# Patient Record
Sex: Male | Born: 1940
Health system: Southern US, Community
[De-identification: ages and names within clinical notes are randomized; demographics above are authoritative.]

## PROBLEM LIST (undated history)

## (undated) DIAGNOSIS — M1990 Unspecified osteoarthritis, unspecified site: Secondary | ICD-10-CM

## (undated) DIAGNOSIS — I712 Thoracic aortic aneurysm, without rupture: Secondary | ICD-10-CM

## (undated) DIAGNOSIS — D75829 Heparin-induced thrombocytopenia, unspecified: Secondary | ICD-10-CM

## (undated) DIAGNOSIS — Z5189 Encounter for other specified aftercare: Secondary | ICD-10-CM

## (undated) DIAGNOSIS — Z941 Heart transplant status: Secondary | ICD-10-CM

## (undated) DIAGNOSIS — D7582 Heparin induced thrombocytopenia (HIT): Secondary | ICD-10-CM

## (undated) DIAGNOSIS — J449 Chronic obstructive pulmonary disease, unspecified: Secondary | ICD-10-CM

## (undated) DIAGNOSIS — I714 Abdominal aortic aneurysm, without rupture, unspecified: Secondary | ICD-10-CM

## (undated) DIAGNOSIS — J90 Pleural effusion, not elsewhere classified: Secondary | ICD-10-CM

## (undated) DIAGNOSIS — I517 Cardiomegaly: Secondary | ICD-10-CM

## (undated) DIAGNOSIS — F32A Depression, unspecified: Secondary | ICD-10-CM

## (undated) DIAGNOSIS — E7800 Pure hypercholesterolemia, unspecified: Secondary | ICD-10-CM

## (undated) DIAGNOSIS — F329 Major depressive disorder, single episode, unspecified: Secondary | ICD-10-CM

## (undated) DIAGNOSIS — R0602 Shortness of breath: Secondary | ICD-10-CM

## (undated) DIAGNOSIS — I499 Cardiac arrhythmia, unspecified: Secondary | ICD-10-CM

## (undated) DIAGNOSIS — J45909 Unspecified asthma, uncomplicated: Secondary | ICD-10-CM

## (undated) DIAGNOSIS — I10 Essential (primary) hypertension: Secondary | ICD-10-CM

## (undated) DIAGNOSIS — I7121 Aneurysm of the ascending aorta, without rupture: Secondary | ICD-10-CM

## (undated) DIAGNOSIS — I2510 Atherosclerotic heart disease of native coronary artery without angina pectoris: Secondary | ICD-10-CM

## (undated) HISTORY — DX: Unspecified osteoarthritis, unspecified site: M19.90

## (undated) HISTORY — DX: Heart transplant status: Z94.1

## (undated) HISTORY — DX: Thoracic aortic aneurysm, without rupture: I71.2

## (undated) HISTORY — DX: Cardiomegaly: I51.7

## (undated) HISTORY — DX: Atherosclerotic heart disease of native coronary artery without angina pectoris: I25.10

## (undated) HISTORY — DX: Unspecified asthma, uncomplicated: J45.909

## (undated) HISTORY — DX: Major depressive disorder, single episode, unspecified: F32.9

## (undated) HISTORY — DX: Depression, unspecified: F32.A

## (undated) HISTORY — DX: Encounter for other specified aftercare: Z51.89

## (undated) HISTORY — DX: Aneurysm of the ascending aorta, without rupture: I71.21

## (undated) HISTORY — DX: Pleural effusion, not elsewhere classified: J90

---

## 1993-07-13 HISTORY — PX: HEART TRANSPLANT: SHX268

## 1999-09-23 ENCOUNTER — Encounter: Payer: Self-pay | Admitting: *Deleted

## 1999-09-23 ENCOUNTER — Ambulatory Visit (HOSPITAL_COMMUNITY): Admission: RE | Admit: 1999-09-23 | Discharge: 1999-09-23 | Payer: Self-pay | Admitting: *Deleted

## 1999-10-01 ENCOUNTER — Encounter: Payer: Self-pay | Admitting: *Deleted

## 1999-10-01 ENCOUNTER — Ambulatory Visit (HOSPITAL_COMMUNITY): Admission: RE | Admit: 1999-10-01 | Discharge: 1999-10-01 | Payer: Self-pay | Admitting: *Deleted

## 2001-05-05 ENCOUNTER — Ambulatory Visit (HOSPITAL_COMMUNITY): Admission: RE | Admit: 2001-05-05 | Discharge: 2001-05-05 | Payer: Self-pay | Admitting: Family Medicine

## 2001-05-05 ENCOUNTER — Encounter: Payer: Self-pay | Admitting: Family Medicine

## 2003-07-09 ENCOUNTER — Ambulatory Visit (HOSPITAL_COMMUNITY): Admission: RE | Admit: 2003-07-09 | Discharge: 2003-07-09 | Payer: Self-pay | Admitting: Family Medicine

## 2005-05-25 ENCOUNTER — Ambulatory Visit (HOSPITAL_COMMUNITY): Admission: RE | Admit: 2005-05-25 | Discharge: 2005-05-25 | Payer: Self-pay | Admitting: Gastroenterology

## 2006-04-30 ENCOUNTER — Ambulatory Visit: Payer: Self-pay

## 2006-04-30 ENCOUNTER — Encounter: Payer: Self-pay | Admitting: Internal Medicine

## 2009-03-04 ENCOUNTER — Encounter: Admission: RE | Admit: 2009-03-04 | Discharge: 2009-03-04 | Payer: Self-pay | Admitting: Family Medicine

## 2009-06-18 ENCOUNTER — Encounter: Admission: RE | Admit: 2009-06-18 | Discharge: 2009-06-18 | Payer: Self-pay | Admitting: Family Medicine

## 2010-11-28 NOTE — Op Note (Signed)
NAME:  Luke Jacobson, PEREGOY NO.:  [id number]   MEDICAL RECORD NO.:  [id number]          PATIENT TYPE:  AMB   LOCATION:  ENDO                         FACILITY:  [facility]   PHYSICIAN:  Tip L. Rolla Flatten., M.D.DATE OF BIRTH:  [birth year]   DATE OF PROCEDURE:  05/25/2005  DATE OF DISCHARGE:                                 OPERATIVE REPORT   PROCEDURE:  Colonoscopy.   MEDICATIONS GIVEN:  Tobramycin 80 mg and ampicillin 3 grams IV due to  previous heart transplant, Fentanyl 75 mcg, Versed 6 mg IV.   INSTRUMENT USED:  Pediatric adjustable colonoscope.   INDICATIONS FOR PROCEDURE:  Colon cancer screening per Dr. Stann Mainland at [facility] in  a gentleman who is ten years status post heart transplant doing well.   DESCRIPTION OF PROCEDURE:  The procedure was explained to the patient and  consent obtained.  After the patient received his preoperative antibiotics,  he was sedated and the pediatric scope was inserted and advanced.  We were  able to easily reach the cecum.  The ileocecal valve and appendiceal orifice  were seen.  The scope was withdrawn and the mucosa carefully examined.  No  polyps seen throughout the entire colon.  Moderate diverticulosis in the  sigmoid colon.  The rectum was free of polyps.  The scope was withdrawn.  The patient tolerated the procedure well.   ASSESSMENT:  1.  Normal screening colonoscopy, V76.51.  2.  Moderate diverticulosis.   PLAN:  Will recommend yearly hemoccults and possibly repeat the procedure in  ten years.  We will give the patient ampicillin 1 gram orally q.8h. for one  day following the procedure.           ______________________________  Joyice Faster. Rolla Flatten., M.D.     Jaynie Bream  D:  05/25/2005  T:  05/25/2005  Job:  [id number]   cc:   Marijo File, M.D.   Charlynn Grimes, M.D.

## 2011-09-08 DIAGNOSIS — T86290 Cardiac allograft vasculopathy: Secondary | ICD-10-CM | POA: Insufficient documentation

## 2011-09-08 DIAGNOSIS — D75829 Heparin-induced thrombocytopenia, unspecified: Secondary | ICD-10-CM | POA: Insufficient documentation

## 2011-09-08 DIAGNOSIS — Z87891 Personal history of nicotine dependence: Secondary | ICD-10-CM | POA: Insufficient documentation

## 2011-09-08 DIAGNOSIS — D7582 Heparin induced thrombocytopenia (HIT): Secondary | ICD-10-CM | POA: Insufficient documentation

## 2011-09-08 DIAGNOSIS — N200 Calculus of kidney: Secondary | ICD-10-CM | POA: Insufficient documentation

## 2012-03-08 ENCOUNTER — Emergency Department (HOSPITAL_COMMUNITY): Payer: Medicare Other

## 2012-03-08 ENCOUNTER — Emergency Department (HOSPITAL_COMMUNITY)
Admission: EM | Admit: 2012-03-08 | Discharge: 2012-03-09 | Disposition: A | Discharge status: Home / Self Care | Payer: Medicare Other | Source: Home / Self Care | Attending: Emergency Medicine | Admitting: Emergency Medicine

## 2012-03-08 ENCOUNTER — Encounter (HOSPITAL_COMMUNITY): Payer: Self-pay | Admitting: *Deleted

## 2012-03-08 DIAGNOSIS — R05 Cough: Secondary | ICD-10-CM | POA: Insufficient documentation

## 2012-03-08 DIAGNOSIS — R059 Cough, unspecified: Secondary | ICD-10-CM | POA: Insufficient documentation

## 2012-03-08 HISTORY — DX: Essential (primary) hypertension: I10

## 2012-03-08 NOTE — ED Notes (Signed)
The pt has had a cough since  Friday night.  He was seen at [facility] and sent here for a chest cold and bronchitis.  The doctor wants him to be seen to get a chest xray

## 2012-03-08 NOTE — ED Notes (Signed)
The pt refuses anan ekg,  He says he is here only for a chest xray

## 2012-03-08 NOTE — ED Notes (Signed)
Heart transplant 18 years ago

## 2012-03-10 ENCOUNTER — Inpatient Hospital Stay (HOSPITAL_COMMUNITY)
Admission: EM | Admit: 2012-03-10 | Discharge: 2012-03-13 | DRG: 193 | Disposition: A | Discharge status: Home / Self Care | Payer: Medicare Other | Attending: Internal Medicine | Admitting: Internal Medicine

## 2012-03-10 ENCOUNTER — Emergency Department (HOSPITAL_COMMUNITY): Payer: Medicare Other

## 2012-03-10 ENCOUNTER — Encounter (HOSPITAL_COMMUNITY): Payer: Self-pay

## 2012-03-10 DIAGNOSIS — R0602 Shortness of breath: Secondary | ICD-10-CM | POA: Insufficient documentation

## 2012-03-10 DIAGNOSIS — J189 Pneumonia, unspecified organism: Secondary | ICD-10-CM

## 2012-03-10 DIAGNOSIS — J9610 Chronic respiratory failure, unspecified whether with hypoxia or hypercapnia: Secondary | ICD-10-CM | POA: Insufficient documentation

## 2012-03-10 DIAGNOSIS — I509 Heart failure, unspecified: Secondary | ICD-10-CM

## 2012-03-10 DIAGNOSIS — I5033 Acute on chronic diastolic (congestive) heart failure: Secondary | ICD-10-CM

## 2012-03-10 DIAGNOSIS — J449 Chronic obstructive pulmonary disease, unspecified: Secondary | ICD-10-CM

## 2012-03-10 DIAGNOSIS — R0902 Hypoxemia: Secondary | ICD-10-CM

## 2012-03-10 DIAGNOSIS — J441 Chronic obstructive pulmonary disease with (acute) exacerbation: Secondary | ICD-10-CM | POA: Diagnosis present

## 2012-03-10 DIAGNOSIS — I5032 Chronic diastolic (congestive) heart failure: Secondary | ICD-10-CM | POA: Insufficient documentation

## 2012-03-10 DIAGNOSIS — I10 Essential (primary) hypertension: Secondary | ICD-10-CM | POA: Diagnosis present

## 2012-03-10 DIAGNOSIS — J159 Unspecified bacterial pneumonia: Secondary | ICD-10-CM

## 2012-03-10 DIAGNOSIS — N179 Acute kidney failure, unspecified: Secondary | ICD-10-CM

## 2012-03-10 DIAGNOSIS — Z941 Heart transplant status: Secondary | ICD-10-CM

## 2012-03-10 DIAGNOSIS — J9600 Acute respiratory failure, unspecified whether with hypoxia or hypercapnia: Secondary | ICD-10-CM

## 2012-03-10 DIAGNOSIS — I2510 Atherosclerotic heart disease of native coronary artery without angina pectoris: Secondary | ICD-10-CM

## 2012-03-10 HISTORY — DX: Chronic obstructive pulmonary disease, unspecified: J44.9

## 2012-03-10 HISTORY — DX: Shortness of breath: R06.02

## 2012-03-10 HISTORY — DX: Pure hypercholesterolemia, unspecified: E78.00

## 2012-03-10 HISTORY — DX: Heparin-induced thrombocytopenia, unspecified: D75.829

## 2012-03-10 HISTORY — DX: Heparin induced thrombocytopenia (HIT): D75.82

## 2012-03-10 LAB — BASIC METABOLIC PANEL
Chloride: 100 mEq/L (ref 96–112)
GFR calc Af Amer: 79 mL/min — ABNORMAL LOW (ref >90)
GFR calc non Af Amer: 68 mL/min — ABNORMAL LOW (ref >90)
Potassium: 4.4 mEq/L (ref 3.5–5.1)
Sodium: 135 mEq/L (ref 135–145)

## 2012-03-10 LAB — URINALYSIS, ROUTINE W REFLEX MICROSCOPIC
Leukocytes, UA: NEGATIVE
Nitrite: NEGATIVE
Specific Gravity, Urine: 1.029 (ref 1.005–1.030)
Urobilinogen, UA: 1 mg/dL (ref 0.0–1.0)
pH: 6 (ref 5.0–8.0)

## 2012-03-10 LAB — CBC WITH DIFFERENTIAL/PLATELET
Basophils Absolute: 0 10*3/uL (ref 0.0–0.1)
Basophils Relative: 0 % (ref 0–1)
Eosinophils Absolute: 0.1 10*3/uL (ref 0.0–0.7)
Hemoglobin: 14.9 g/dL (ref 13.0–17.0)
MCH: 31.1 pg (ref 26.0–34.0)
MCHC: 34 g/dL (ref 30.0–36.0)
Neutro Abs: 3.6 10*3/uL (ref 1.7–7.7)
Neutrophils Relative %: 60 % (ref 43–77)
Platelets: 95 10*3/uL — ABNORMAL LOW (ref 150–400)
RDW: 13.5 % (ref 11.5–15.5)

## 2012-03-10 LAB — URINE MICROSCOPIC-ADD ON

## 2012-03-10 LAB — PRO B NATRIURETIC PEPTIDE: Pro B Natriuretic peptide (BNP): 492.2 pg/mL — ABNORMAL HIGH (ref 0–125)

## 2012-03-10 LAB — MRSA PCR SCREENING: MRSA by PCR: NEGATIVE

## 2012-03-10 MED ORDER — ACETAMINOPHEN 650 MG RE SUPP: 650.0000 mg | Freq: Four times a day (QID) | RECTAL | Status: DC | PRN

## 2012-03-10 MED ORDER — IPRATROPIUM BROMIDE 0.02 % IN SOLN
0.5000 mg | Freq: Four times a day (QID) | RESPIRATORY_TRACT | Status: DC
  Administered 2012-03-10 – 2012-03-11 (×5): 0.5 mg via RESPIRATORY_TRACT
  Filled 2012-03-10 (×5): qty 2.5

## 2012-03-10 MED ORDER — ONDANSETRON HCL 4 MG PO TABS: 4.0000 mg | ORAL_TABLET | Freq: Four times a day (QID) | ORAL | Status: DC | PRN

## 2012-03-10 MED ORDER — LORATADINE 10 MG PO TABS
10.0000 mg | ORAL_TABLET | Freq: Every day | ORAL | Status: DC
  Administered 2012-03-11 – 2012-03-13 (×3): 10 mg via ORAL
  Filled 2012-03-10 (×3): qty 1

## 2012-03-10 MED ORDER — ONDANSETRON HCL 4 MG/2ML IJ SOLN
4.0000 mg | Freq: Four times a day (QID) | INTRAMUSCULAR | Status: DC | PRN
  Administered 2012-03-12: 4 mg via INTRAVENOUS
  Filled 2012-03-10: qty 2

## 2012-03-10 MED ORDER — FAMOTIDINE 20 MG PO TABS
20.0000 mg | ORAL_TABLET | Freq: Every day | ORAL | Status: DC
  Administered 2012-03-10 – 2012-03-13 (×4): 20 mg via ORAL
  Filled 2012-03-10 (×4): qty 1

## 2012-03-10 MED ORDER — MAGNESIUM OXIDE 400 MG PO TABS
800.0000 mg | ORAL_TABLET | Freq: Two times a day (BID) | ORAL | Status: DC
  Administered 2012-03-10 – 2012-03-13 (×6): 800 mg via ORAL
  Filled 2012-03-10 (×7): qty 2

## 2012-03-10 MED ORDER — MYCOPHENOLATE MOFETIL 250 MG PO CAPS
1500.0000 mg | ORAL_CAPSULE | Freq: Once | ORAL | Status: AC
  Administered 2012-03-10: 1500 mg via ORAL
  Filled 2012-03-10: qty 6

## 2012-03-10 MED ORDER — ISOSORBIDE MONONITRATE ER 30 MG PO TB24
45.0000 mg | ORAL_TABLET | Freq: Every evening | ORAL | Status: DC
  Administered 2012-03-10 – 2012-03-12 (×3): 45 mg via ORAL
  Filled 2012-03-10 (×4): qty 1

## 2012-03-10 MED ORDER — DEXTROSE 5 % IV SOLN
2.0000 g | Freq: Two times a day (BID) | INTRAVENOUS | Status: DC
  Administered 2012-03-10 – 2012-03-12 (×5): 2 g via INTRAVENOUS
  Filled 2012-03-10 (×7): qty 2

## 2012-03-10 MED ORDER — OMEGA-3-ACID ETHYL ESTERS 1 G PO CAPS
1.0000 g | ORAL_CAPSULE | Freq: Every evening | ORAL | Status: DC
  Administered 2012-03-10 – 2012-03-12 (×3): 1 g via ORAL
  Filled 2012-03-10 (×4): qty 1

## 2012-03-10 MED ORDER — ALBUTEROL SULFATE (5 MG/ML) 0.5% IN NEBU
2.5000 mg | INHALATION_SOLUTION | Freq: Four times a day (QID) | RESPIRATORY_TRACT | Status: DC
  Administered 2012-03-10 – 2012-03-11 (×4): 2.5 mg via RESPIRATORY_TRACT
  Filled 2012-03-10 (×4): qty 0.5

## 2012-03-10 MED ORDER — ALBUTEROL SULFATE (5 MG/ML) 0.5% IN NEBU: 2.5000 mg | INHALATION_SOLUTION | RESPIRATORY_TRACT | Status: DC | PRN

## 2012-03-10 MED ORDER — VANCOMYCIN HCL 500 MG IV SOLR
500.0000 mg | Freq: Two times a day (BID) | INTRAVENOUS | Status: DC
  Administered 2012-03-10 – 2012-03-12 (×4): 500 mg via INTRAVENOUS
  Filled 2012-03-10 (×4): qty 500

## 2012-03-10 MED ORDER — SODIUM CHLORIDE 0.9 % IV SOLN
INTRAVENOUS | Status: DC
  Administered 2012-03-10: 50 mL via INTRAVENOUS

## 2012-03-10 MED ORDER — ASPIRIN EC 81 MG PO TBEC
81.0000 mg | DELAYED_RELEASE_TABLET | Freq: Every day | ORAL | Status: DC
  Administered 2012-03-10 – 2012-03-13 (×4): 81 mg via ORAL
  Filled 2012-03-10 (×4): qty 1

## 2012-03-10 MED ORDER — TEMAZEPAM 15 MG PO CAPS
30.0000 mg | ORAL_CAPSULE | Freq: Every evening | ORAL | Status: DC | PRN
  Administered 2012-03-10 – 2012-03-12 (×3): 30 mg via ORAL
  Filled 2012-03-10 (×3): qty 2

## 2012-03-10 MED ORDER — FUROSEMIDE 10 MG/ML IJ SOLN
40.0000 mg | Freq: Two times a day (BID) | INTRAMUSCULAR | Status: DC
  Administered 2012-03-10 – 2012-03-12 (×5): 40 mg via INTRAVENOUS
  Filled 2012-03-10 (×9): qty 4

## 2012-03-10 MED ORDER — FISH OIL 1000 MG PO CAPS
1.0000 | ORAL_CAPSULE | Freq: Every evening | ORAL | Status: DC
  Filled 2012-03-10: qty 1

## 2012-03-10 MED ORDER — LEVOFLOXACIN IN D5W 750 MG/150ML IV SOLN
750.0000 mg | Freq: Once | INTRAVENOUS | Status: AC
  Administered 2012-03-10: 750 mg via INTRAVENOUS
  Filled 2012-03-10: qty 150

## 2012-03-10 MED ORDER — SODIUM CHLORIDE 0.9 % IJ SOLN
3.0000 mL | Freq: Two times a day (BID) | INTRAMUSCULAR | Status: DC
  Administered 2012-03-10 – 2012-03-12 (×5): 3 mL via INTRAVENOUS

## 2012-03-10 MED ORDER — ACETAMINOPHEN 325 MG PO TABS: 650.0000 mg | ORAL_TABLET | Freq: Four times a day (QID) | ORAL | Status: DC | PRN

## 2012-03-10 MED ORDER — MYCOPHENOLATE MOFETIL 500 MG PO TABS
1500.0000 mg | ORAL_TABLET | Freq: Two times a day (BID) | ORAL | Status: DC
  Filled 2012-03-10: qty 3

## 2012-03-10 MED ORDER — DILTIAZEM HCL ER BEADS 240 MG PO CP24
240.0000 mg | ORAL_CAPSULE | Freq: Every day | ORAL | Status: DC
  Administered 2012-03-10 – 2012-03-13 (×4): 240 mg via ORAL
  Filled 2012-03-10 (×4): qty 1

## 2012-03-10 MED ORDER — LEVOFLOXACIN IN D5W 750 MG/150ML IV SOLN
750.0000 mg | INTRAVENOUS | Status: DC
  Filled 2012-03-10: qty 150

## 2012-03-10 MED ORDER — MYCOPHENOLATE MOFETIL 500 MG PO TABS: 1500.0000 mg | ORAL_TABLET | Freq: Two times a day (BID) | ORAL | Status: DC

## 2012-03-10 MED ORDER — CYCLOSPORINE MODIFIED (NEORAL) 25 MG PO CAPS
75.0000 mg | ORAL_CAPSULE | Freq: Two times a day (BID) | ORAL | Status: DC
  Administered 2012-03-10 – 2012-03-13 (×6): 75 mg via ORAL
  Filled 2012-03-10 (×7): qty 3

## 2012-03-10 NOTE — ED Notes (Addendum)
Pt c/o SOB x 6 days. Sat 92% on Room air. Pt LWBS 2 days at [facility] but was able to chest xray and got dx with pneumonia by PCP, started on Levofloxacin (currently still taking it). Breath sound clear. No Pain, productive cough present at times.

## 2012-03-10 NOTE — H&P (Addendum)
[facility] History and Physical  Luke Jacobson WEX:[id number] DOB: 17-Jun-1941 DOA: 03/10/2012  Referring physician: Daleen Bo, MD PCP: Gara Kroner, MD   Chief Complaint: Worsening dyspnea.  HPI: Luke Jacobson is a 71 y.o. male history of ischemic cardiomyopathy, status post heart transplant in 1996, on immunosuppressants, followed at [facility], hypertension, hyperlipidemia, heparin-induced thrombocytopenia, possible COPD presents to the emergency department secondary to worsening dyspnea. He has not been feeling well over the last 6 months with worsening dyspnea, easy tiring, leg swelling and orthopnea. He was extensively evaluated at [facility] approximately a week ago. He and his wife indicate that he underwent a battery of tests including cardiac cath, nuclear stress test, echo and blood test. They were told that he had blockage in his transplanted heart which could not be stented and he was not a candidate for surgery. He was asked to continue his medications including Lasix. Approximately 5 days ago patient started having cough with white sputum, worsening dyspnea and feeling washed out. He denies fever or chills. No chest pain or palpitations or wheezing or chest tightness. 2 days later he went to see his PCP who referred him to the emergency department. He had chest x-ray but left AMA. PCP reviewed his x-ray yesterday and prescribed Levaquin for right lower lobe and lingular space airspace disease concerning for pneumonia. His pulse oximetry was 89% 2 days ago. We did not feel any better and went to see his PCP who referred him to the ED for admission. His O2 sats in the PCPs office was 90%. Evaluation in ED revealed platelet count 95, UA negative, chest x-ray showing improving pneumonia and right lower lobe and lingula since exam 2 days ago. Patient has received a dose of IV Levaquin in the hospitalist service is requested to admit for further management.   Review of Systems: The patient  denies anorexia, fever, weight loss,, vision loss, decreased hearing, hoarseness, chest pain, syncope, balance deficits, hemoptysis, abdominal pain, melena, hematochezia, severe indigestion/heartburn, hematuria, incontinence, genital sores, muscle weakness, suspicious skin lesions, transient blindness, difficulty walking, depression, unusual weight change, abnormal bleeding, enlarged lymph nodes, angioedema, and breast masses.   All systems reviewed and apart from history of presenting illness is pertinent for orthopnea and lower extremity edema. Otherwise review of systems negative.  Past Medical History  Diagnosis Date  . Hypertension   . Shortness of breath   . Hypercholesteremia   . 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 COPD (chronic obstructive pulmonary disease)    Past Surgical History  Procedure Date  . Heart transplant    Social History:  reports that he quit smoking about 18 years ago. He has never used smokeless tobacco. He reports that he drinks alcohol. He reports that he does not use illicit drugs. Married. Independent of activities of daily living. Retired  Allergies  Allergen Reactions  . Heparin     Made white blood count go down. HIT    Family History  Problem Relation Age of Onset  . Heart attack Father     Prior to Admission medications   Medication Sig Start Date End Date Taking? Authorizing Provider  acetaminophen (TYLENOL) 500 MG tablet Take 500 mg by mouth daily.   Yes Historical Provider, MD  aspirin EC 81 MG tablet Take 81 mg by mouth daily.   Yes Historical Provider, MD  cycloSPORINE modified (NEORAL) 25 MG capsule Take 75 mg by mouth 2 (two) times daily.   Yes Historical Provider, MD  diltiazem (TIAZAC) 240 MG  24 hr capsule Take 240 mg by mouth daily.   Yes Historical Provider, MD  fexofenadine (ALLEGRA) 180 MG tablet Take 180 mg by mouth daily.   Yes Historical Provider, MD  GARLIC PO Take 1 tablet by mouth daily.   Yes Historical Provider, MD    isosorbide mononitrate (IMDUR) 30 MG 24 hr tablet Take 45 mg by mouth every evening.   Yes Historical Provider, MD  levofloxacin (LEVAQUIN) 500 MG tablet Take 500 mg by mouth daily. PT TO TAKE FOR 10 DAYS ON DAY 1 OF THERAPY   Yes Historical Provider, MD  magnesium oxide (MAG-OX) 400 MG tablet Take 800 mg by mouth 2 (two) times daily.   Yes Historical Provider, MD  mycophenolate (CELLCEPT) 500 MG tablet Take 1,500 mg by mouth 2 (two) times daily.   Yes Historical Provider, MD  Omega-3 Fatty Acids (FISH OIL) 1000 MG CAPS Take 1 capsule by mouth every evening.   Yes Historical Provider, MD  ranitidine (ZANTAC) 75 MG tablet Take 150 mg by mouth daily.   Yes Historical Provider, MD  temazepam (RESTORIL) 15 MG capsule Take 30 mg by mouth at bedtime as needed. For sleep   Yes Historical Provider, MD   Physical Exam: Filed Vitals:   03/10/12 1244 03/10/12 1245 03/10/12 1647 03/10/12 1840  BP: 136/83 136/83 135/85   Pulse: 83 82 79   Temp: 98.5 F (36.9 C) 98.5 F (36.9 C) 97.6 F (36.4 C)   TempSrc: Oral Oral    Resp: _0 Height:    _1  (1.702 m)  SpO2: 95% 93% 95%    General exam: Moderately built and nourished male patient lying propped up on the gurney with mild respiratory distress. Able to speak in long sentences without difficulty however. Head, eyes and ENT: Nontraumatic and normocephalic. Pupils equally reacting to light and accommodation. Neck: Supple. No JVD or carotid bruit. No thyromegaly. Lymphatics: No lymphadenopathy. Respiratory system: Reduced breath sounds bibasally with bibasal crackles. Rest of the lung fields are clear. Mild increased work of breathing. Cardiovascular system: S1 and S2 heard, regular rate and rhythm. No JVD, murmurs. 2+ bilateral leg edema up to mid shin. Gastrointestinal system: Abdomen is nondistended, soft and nontender. Normal bowel sounds heard. No organomegaly or masses appreciated. Central nervous system: Alert and oriented x3. No focal  neurological deficits. Extremities: Symmetric 5 x 5 power. Peripheral pulses symmetrically felt. Skin: No skin tears or rashes. Musculoskeletal system: Negative. Psychiatry: Pleasant affect.   Labs on Admission:  Basic Metabolic Panel:  Lab 00/92/33 1355  NA 135  K 4.4  CL 100  CO2 26  GLUCOSE 75  BUN 20  CREATININE 1.07  CALCIUM 9.2  MG --  PHOS --   Liver Function Tests: No results found for this basename: AST:5,ALT:5,ALKPHOS:5,BILITOT:5,PROT:5,ALBUMIN:5 in the last 168 hours No results found for this basename: LIPASE:5,AMYLASE:5 in the last 168 hours No results found for this basename: AMMONIA:5 in the last 168 hours CBC:  Lab 03/10/12 1355  WBC 6.0  NEUTROABS 3.6  HGB 14.9  HCT 43.8  MCV 91.4  PLT 95*   Cardiac Enzymes: No results found for this basename: CKTOTAL:5,CKMB:5,CKMBINDEX:5,TROPONINI:5 in the last 168 hours  BNP (last 3 results) No results found for this basename: PROBNP:3 in the last 8760 hours CBG: No results found for this basename: GLUCAP:5 in the last 168 hours  Radiological Exams on Admission: Dg Chest 2 View  03/10/2012  *RADIOLOGY REPORT*  Clinical Data: Cough.  Shortness of breath.  History of heart transplant in 1996.  CHEST - 2 VIEW  Comparison: Two-view chest x-ray 03/08/2012 [facility], and 06/18/2009, 03/04/2009 [facility].  Findings: Prior sternotomy.  Cardiac silhouette upper normal in size.  Thoracic aorta tortuous and atherosclerotic, unchanged. Enlarged central pulmonary arteries, unchanged.  Hyperinflation, coarse reticular interstitial lung disease, chronic blunting of the costophrenic angles, scarring in the lower lobes, and calcified granuloma in the lingula, unchanged. Interval improvement in the opacities in the lingula and right lower lobe since the examination 2 days ago, with minimal residual right lower lobe opacity persisting.  No new pulmonary parenchymal abnormalities. Degenerative changes involving the  thoracic spine.  IMPRESSION: Improving pneumonia in the right lower lobe and lingula since the examination 2 days ago.  Stable COPD, chronic pleuroparenchymal scarring at the bases, and old granulomatous disease.  No new abnormalities.   Original Report Authenticated By: Deniece Portela, M.D.     EKG: Independently reviewed. Normal sinus rhythm at 83 beats per minute, normal axis, biatrial abnormalities and QTC of 437 ms.  Assessment/Plan Active Problems:  CAP (community acquired pneumonia)  CAD (coronary artery disease)  H/O heart transplant  Hypertension  Respiratory failure, acute  Diastolic CHF, acute on chronic  COPD (chronic obstructive pulmonary disease)  HTN (hypertension)  1. Community acquired right lower lobe and lingual pneumonia: Admit to telemetry. Blood cultures have been drawn in the ED. Since patient is on immunosuppressants and patient was recently at [facility] and in the ED here, as discussed with ID M.D. on call, we'll broaden antibiotic spectrum to include IV Zosyn, cefepime and Levaquin. However if patient shows rapid improvement in cultures remain negative then recommend rapidly de-escalating antibiotics. 2. Possible acute on chronic diastolic congestive heart failure: Patient indicates that he's on Lasix but does not take it regularly. Check BMP stat. Patient recently had extensive cardiac workup and can check with [facility] regarding recent echo. Place on IV Lasix and monitor. 3. Possible COPD: Currently no active clinical bronchospasm. Bronchodilator nebulizations and oxygen. 4. Acute hypoxic respiratory failure: Secondary to pneumonia, CHF and COPD. Oxygen and bronchodilator nebulizations. 5. Status post heart transplant: Continue home immunosuppressants. 6. Hypertension: Controlled. Continue home medications. 7. Thrombocytopenia: Follow CBCs tomorrow. Patient has history of HIT hence no heparin products. SCDs for DVT prophylaxis.   Code Status:  Full Family Communication: Discussed at length with patient spouse was at the bedside, updated care and answered questions. Disposition Plan: To be determined.  Time spent: 65 minutes  [facility] Pager [phone]  If 7PM-7AM, please contact night-coverage [email or web address] Password [facility] 03/10/2012, 6:53 PM

## 2012-03-10 NOTE — Progress Notes (Signed)
Luke Jacobson, is a 71 y.o. male,   MRN: [id number]  -  DOB - [birth year]  Outpatient Primary MD for the patient is DEFAULT,PROVIDER, MD  in for    Chief Complaint  Patient presents with  . Shortness of Breath     Blood pressure 135/85, pulse 79, temperature 97.6 F (36.4 C), temperature source Oral, resp. rate 20, SpO2 95.00%.  Active Problems:  Hypoxia  CAP (community acquired pneumonia)  CAD (coronary artery disease)  H/O heart transplant  Hypertension  Shortness of breath     [name] is very pleasant a 71 y.o. Male with cough, productive of sputum for one week, worsening. He has had shortness of breath. He was in the ED 3 days ago, but did not stay to be evaluated. His primary care Dr. called in [place] for him yesterday. Today he went to the office and was found to be hypoxic so sent here for evaluation. He denies fever, chills, nausea, or vomiting. He gets more out of breath with ambulation. He reports that his PCP has given him an inhaler in past but he has resisted using it as "i am already on so much medicine". He also reports needing 2 pillows to sleep and 3 last couple of days. States his baseline breathing effort has been slowly worsening over last couple of years.  He is former smoker.    Patient was recently evaluated at [facility] as he had heart transplant there 18 years ago. Also Hx CAD with stents.  There his oxygen saturations were 89-90% on room air.  He was told that he had three-vessel coronary disease, and that his stent was stable. They did not offer additional treatments , or recommendations, at that time  In ED lab work within normal limits and chest xray shows some improvement from 3 days ago. Sat 84% on room air. IV started at 50/hr, dose of levaquin and oxygen. Sats  95% on 2L  On exam, mild to moderate increased work of breathing. Can complete sentences, but does use accessory muscles. On auscultation decreased air movement and very faint end  expiratory wheeze on right . VSS, afebrile, non toxic.   Will admit to tele failed OP CAP . Suspect some underlying COPD component as well.

## 2012-03-10 NOTE — ED Notes (Signed)
MD at bedside.

## 2012-03-10 NOTE — ED Notes (Signed)
Report called to 4th floor.

## 2012-03-10 NOTE — Progress Notes (Addendum)
ANTIBIOTIC CONSULT NOTE - INITIAL  Pharmacy Consult for:  Vancomycin, Levaquin, Cefepime Indication:  Suspected pneumonia  Allergies  Allergen Reactions  . Heparin     Made white blood count go down. HIT    Patient Measurements: Height: _0  (170.2 cm) Weight: 129 lb 3 oz (58.6 kg) IBW/kg (Calculated) : 66.1    Vital Signs: Temp: 97.9 F (36.6 C) (08/29 1900) Temp src: Oral (08/29 1900) BP: 161/96 mmHg (08/29 1900) Pulse Rate: 80  (08/29 1900)    Labs:  Basename 03/10/12 1355  WBC 6.0  HGB 14.9  PLT 95*  LABCREA --  CREATININE 1.07   Estimated Creatinine Clearance: 53.2 ml/min (by C-G formula based on Cr of 1.07).  Medical History: Past Medical History  Diagnosis Date  . Hypertension   . Shortness of breath   . Hypercholesteremia   . 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 COPD (chronic obstructive pulmonary disease)     Medications:  Scheduled:    . albuterol  2.5 mg Nebulization Q6H  . aspirin EC  81 mg Oral Daily  . cycloSPORINE modified  75 mg Oral BID  . diltiazem  240 mg Oral Daily  . famotidine  20 mg Oral Daily  . furosemide  40 mg Intravenous BID  . ipratropium  0.5 mg Nebulization Q6H  . isosorbide mononitrate  45 mg Oral QPM  . levofloxacin (LEVAQUIN) IV  750 mg Intravenous Once  . loratadine  10 mg Oral Daily  . magnesium oxide  800 mg Oral BID  . mycophenolate  1,500 mg Oral BID  . omega-3 acid ethyl esters  1 g Oral QPM  . sodium chloride  3 mL Intravenous Q12H  . DISCONTD: Fish Oil  1 capsule Oral QPM   Assessment:  Asked to assist with antibiotic therapy for this 71 year-old male with suspected pneumonia.    Receiving immunosuppressive agent - Cellcept - due to transplant.  Vancomycin and Cefepime doses will be adjusted for renal function.  Goal of Therapy:   Vancomycin trough levels 15-20 mcg/ml  Adjustment of antibiotic doses for renal function  Eradication of infection  Plan:   Cefepime 2 grams IV every 12  hours  Levaquin 750 mg IV every 24 hours  Vancomycin 500 mg IV every 12 hours  [employer].Ph. 03/10/2012 7:58 PM

## 2012-03-10 NOTE — ED Provider Notes (Signed)
History     CSN: [id number]  Arrival date & time 03/10/12  1229   First MD Initiated Contact with Patient 03/10/12 1337      Chief Complaint  Patient presents with  . Shortness of Breath    (Consider location/radiation/quality/duration/timing/severity/associated sxs/prior treatment) HPI Comments: Luke Jacobson is a 71 y.o. Male with cough, productive of sputum for one week, worsening. He has had shortness of breath. He was in the ED 3 days ago, but did not stay to be evaluated. His primary care Dr. called in [place] for him yesterday. Today he went to the office and was found to be hypoxic so sent here for evaluation. He denies fever, chills, nausea, or vomiting. He gets more out of breath with ambulation. He does not smoke cigarettes.  There are no Palliative factors.  Patient was recently in evaluated at [facility], there his oxygen saturations were 89-90% on room air. He had outpatient evaluations. He was told that he had three-vessel coronary disease, and that his stent was stable. They did not offer additional treatments , or recommendations, at that time.   The history is provided by the patient and the spouse.    Past Medical History  Diagnosis Date  . Hypertension   . Shortness of breath     Past Surgical History  Procedure Date  . Heart transplant     History reviewed. No pertinent family history.  History  Substance Use Topics  . Smoking status: Former Smoker -- 30 years    Quit date: 03/10/1994  . Smokeless tobacco: Never Used  . Alcohol Use: Yes     social      Review of Systems  All other systems reviewed and are negative.    Allergies  Heparin  Home Medications   Current Outpatient Rx  Name Route Sig Dispense Refill  . ACETAMINOPHEN 500 MG PO TABS Oral Take 500 mg by mouth daily.    . ASPIRIN EC 81 MG PO TBEC Oral Take 81 mg by mouth daily.    . CYCLOSPORINE MODIFIED (NEORAL) 25 MG PO CAPS Oral Take 75 mg by mouth 2 (two) times daily.      Marland Kitchen DILTIAZEM HCL ER BEADS 240 MG PO CP24 Oral Take 240 mg by mouth daily.    Marland Kitchen FEXOFENADINE HCL 180 MG PO TABS Oral Take 180 mg by mouth daily.    Marland Kitchen GARLIC PO Oral Take 1 tablet by mouth daily.    . ISOSORBIDE MONONITRATE ER 30 MG PO TB24 Oral Take 45 mg by mouth every evening.    Marland Kitchen LEVOFLOXACIN 500 MG PO TABS Oral Take 500 mg by mouth daily. PT TO TAKE FOR 10 DAYS ON DAY 1 OF THERAPY    . MAGNESIUM OXIDE 400 MG PO TABS Oral Take 800 mg by mouth 2 (two) times daily.    Marland Kitchen MYCOPHENOLATE MOFETIL 500 MG PO TABS Oral Take 1,500 mg by mouth 2 (two) times daily.    Marland Kitchen FISH OIL 1000 MG PO CAPS Oral Take 1 capsule by mouth every evening.    Marland Kitchen RANITIDINE HCL 75 MG PO TABS Oral Take 150 mg by mouth daily.    Marland Kitchen TEMAZEPAM 15 MG PO CAPS Oral Take 30 mg by mouth at bedtime as needed. For sleep      BP 135/85  Pulse 79  Temp 97.6 F (36.4 C) (Oral)  Resp 20  SpO2 95%  Physical Exam  Nursing note and vitals reviewed. Constitutional: He is oriented to person,  place, and time. He appears well-developed and well-nourished.  HENT:  Head: Normocephalic and atraumatic.  Right Ear: External ear normal.  Left Ear: External ear normal.  Eyes: Conjunctivae and EOM are normal. Pupils are equal, round, and reactive to light.  Neck: Normal range of motion and phonation normal. Neck supple.  Cardiovascular: Normal rate, regular rhythm, normal heart sounds and intact distal pulses.   Pulmonary/Chest: Effort normal. No respiratory distress. He has no wheezes. He has rales (Bilateral, with rhonchi.). He exhibits no bony tenderness.  Abdominal: Soft. Normal appearance. There is no tenderness.  Musculoskeletal: Normal range of motion.  Neurological: He is alert and oriented to person, place, and time. He has normal strength. No cranial nerve deficit or sensory deficit. He exhibits normal muscle tone. Coordination normal.  Skin: Skin is warm, dry and intact.  Psychiatric: He has a normal mood and affect. His behavior  is normal. Judgment and thought content normal.    ED Course  Procedures (including critical care time)  And hypoxia, 84% on room air, but improved to 95% with 2 L per nasal cannula. He was treated with IV Levaquin 750 mg.  Reevaluation: 16:25- pulse ox 93% on 2 L is cannula oxygen.  Labs Reviewed  CBC WITH DIFFERENTIAL - Abnormal; Notable for the following:    Platelets 95 (*)     Monocytes Relative 19 (*)     Monocytes Absolute 1.1 (*)     All other components within normal limits  BASIC METABOLIC PANEL - Abnormal; Notable for the following:    GFR calc non Af Amer 68 (*)     GFR calc Af Amer 79 (*)     All other components within normal limits  URINALYSIS, ROUTINE W REFLEX MICROSCOPIC  CULTURE, BLOOD (ROUTINE X 2)  CULTURE, BLOOD (ROUTINE X 2)   Dg Chest 2 View  03/10/2012  *RADIOLOGY REPORT*  Clinical Data: Cough.  Shortness of breath.  History of heart transplant in 1996.  CHEST - 2 VIEW  Comparison: Two-view chest x-ray 03/08/2012 [facility], and 06/18/2009, 03/04/2009 [facility].  Findings: Prior sternotomy.  Cardiac silhouette upper normal in size.  Thoracic aorta tortuous and atherosclerotic, unchanged. Enlarged central pulmonary arteries, unchanged.  Hyperinflation, coarse reticular interstitial lung disease, chronic blunting of the costophrenic angles, scarring in the lower lobes, and calcified granuloma in the lingula, unchanged. Interval improvement in the opacities in the lingula and right lower lobe since the examination 2 days ago, with minimal residual right lower lobe opacity persisting.  No new pulmonary parenchymal abnormalities. Degenerative changes involving the thoracic spine.  IMPRESSION: Improving pneumonia in the right lower lobe and lingula since the examination 2 days ago.  Stable COPD, chronic pleuroparenchymal scarring at the bases, and old granulomatous disease.  No new abnormalities.   Original Report Authenticated By: Deniece Portela,  M.D.      1. Community acquired pneumonia   2. Hypoxia       MDM  Frail patient with pneumonia, and hypoxia, and multi-comorbidities. Patient will need to be admitted for stabilization. He may need to be placed on in home oxygen. Doubt ACS, sepsis, PE, or metabolic instability.    Plan: Admit- [facility]        Richarda Blade, MD 03/10/12 [phone]

## 2012-03-11 DIAGNOSIS — I10 Essential (primary) hypertension: Secondary | ICD-10-CM

## 2012-03-11 DIAGNOSIS — Z941 Heart transplant status: Secondary | ICD-10-CM

## 2012-03-11 LAB — BASIC METABOLIC PANEL
BUN: 21 mg/dL (ref 6–23)
Calcium: 9 mg/dL (ref 8.4–10.5)
GFR calc Af Amer: 69 mL/min — ABNORMAL LOW (ref >90)
GFR calc non Af Amer: 60 mL/min — ABNORMAL LOW (ref >90)
Potassium: 3.8 mEq/L (ref 3.5–5.1)
Sodium: 139 mEq/L (ref 135–145)

## 2012-03-11 LAB — CBC
HCT: 43.5 % (ref 39.0–52.0)
HCT: 44.4 % (ref 39.0–52.0)
Hemoglobin: 15.3 g/dL (ref 13.0–17.0)
MCH: 30.9 pg (ref 26.0–34.0)
MCH: 31.2 pg (ref 26.0–34.0)
MCHC: 33.6 g/dL (ref 30.0–36.0)
MCHC: 34.5 g/dL (ref 30.0–36.0)
RDW: 13.4 % (ref 11.5–15.5)

## 2012-03-11 LAB — EXPECTORATED SPUTUM ASSESSMENT W GRAM STAIN, RFLX TO RESP C

## 2012-03-11 MED ORDER — ALBUTEROL SULFATE (5 MG/ML) 0.5% IN NEBU
2.5000 mg | INHALATION_SOLUTION | RESPIRATORY_TRACT | Status: DC
  Administered 2012-03-11 – 2012-03-13 (×11): 2.5 mg via RESPIRATORY_TRACT
  Filled 2012-03-11 (×11): qty 0.5

## 2012-03-11 MED ORDER — GUAIFENESIN-DM 100-10 MG/5ML PO SYRP
5.0000 mL | ORAL_SOLUTION | ORAL | Status: DC | PRN
  Administered 2012-03-11 (×2): 5 mL via ORAL
  Filled 2012-03-11 (×2): qty 10

## 2012-03-11 MED ORDER — ENSURE COMPLETE PO LIQD
237.0000 mL | Freq: Two times a day (BID) | ORAL | Status: DC
  Administered 2012-03-12: 237 mL via ORAL

## 2012-03-11 MED ORDER — MYCOPHENOLATE MOFETIL 250 MG PO CAPS
1500.0000 mg | ORAL_CAPSULE | Freq: Once | ORAL | Status: AC
  Administered 2012-03-11: 1500 mg via ORAL
  Filled 2012-03-11: qty 6

## 2012-03-11 MED ORDER — MYCOPHENOLATE MOFETIL 500 MG PO TABS: 1500.0000 mg | ORAL_TABLET | Freq: Two times a day (BID) | ORAL | Status: DC

## 2012-03-11 MED ORDER — MYCOPHENOLATE MOFETIL 250 MG PO CAPS
1500.0000 mg | ORAL_CAPSULE | Freq: Once | ORAL | Status: DC
  Filled 2012-03-11: qty 6

## 2012-03-11 MED ORDER — LEVOFLOXACIN IN D5W 750 MG/150ML IV SOLN: 750.0000 mg | INTRAVENOUS | Status: DC

## 2012-03-11 MED ORDER — LEVOFLOXACIN IN D5W 750 MG/150ML IV SOLN
750.0000 mg | INTRAVENOUS | Status: DC
  Administered 2012-03-12: 750 mg via INTRAVENOUS
  Filled 2012-03-11: qty 150

## 2012-03-11 MED ORDER — IPRATROPIUM BROMIDE 0.02 % IN SOLN
0.5000 mg | Freq: Four times a day (QID) | RESPIRATORY_TRACT | Status: DC
  Administered 2012-03-12 – 2012-03-13 (×6): 0.5 mg via RESPIRATORY_TRACT
  Filled 2012-03-11 (×6): qty 2.5

## 2012-03-11 NOTE — Progress Notes (Signed)
INITIAL ADULT NUTRITION ASSESSMENT Date: 03/11/2012   Time: 1:49 PM Reason for Assessment: Nutrition Risk-unintentional weight loss, decreased appetite, denture problems causing decreased intake  ASSESSMENT: Male 71 y.o.  Dx: pna, acute hypoxic respiratory failure, thrombocytopenia  Hx:  Past Medical History  Diagnosis Date  . Hypertension   . Shortness of breath   . Hypercholesteremia   . 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 COPD (chronic obstructive pulmonary disease)    Past Surgical History  Procedure Date  . Heart transplant     Related Meds:     . albuterol  2.5 mg Nebulization Q6H  . aspirin EC  81 mg Oral Daily  . ceFEPime (MAXIPIME) IV  2 g Intravenous Q12H  . cycloSPORINE modified  75 mg Oral BID  . diltiazem  240 mg Oral Daily  . famotidine  20 mg Oral Daily  . furosemide  40 mg Intravenous BID  . ipratropium  0.5 mg Nebulization Q6H  . isosorbide mononitrate  45 mg Oral QPM  . levofloxacin (LEVAQUIN) IV  750 mg Intravenous Once  . levofloxacin (LEVAQUIN) IV  750 mg Intravenous Q48H  . loratadine  10 mg Oral Daily  . magnesium oxide  800 mg Oral BID  . mycophenolate  1,500 mg Oral Once  . mycophenolate  1,500 mg Oral Once  . mycophenolate  1,500 mg Oral BID  . omega-3 acid ethyl esters  1 g Oral QPM  . sodium chloride  3 mL Intravenous Q12H  . vancomycin  500 mg Intravenous Q12H  . DISCONTD: Fish Oil  1 capsule Oral QPM  . DISCONTD: levofloxacin (LEVAQUIN) IV  750 mg Intravenous Q24H  . DISCONTD: levofloxacin (LEVAQUIN) IV  750 mg Intravenous Q48H  . DISCONTD: mycophenolate  1,500 mg Oral BID  . DISCONTD: mycophenolate  1,500 mg Oral BID     Ht: _0  (170.2 cm)  Wt: 125 lb 14.1 oz (57.1 kg)  Ideal Wt: 61.6 kg  % Ideal Wt: 93  Usual Wt: unknown  Body mass index is 19.72 kg/(m^2).  Labs:  CMP     Component Value Date/Time   NA 139 03/11/2012 0410   K 3.8 03/11/2012 0410   CL 98 03/11/2012 0410   CO2 29 03/11/2012 0410   GLUCOSE 131*  03/11/2012 0410   BUN 21 03/11/2012 0410   CREATININE 1.20 03/11/2012 0410   CALCIUM 9.0 03/11/2012 0410   GFRNONAA 60* 03/11/2012 0410   GFRAA 69* 03/11/2012 0410    I/O last 3 completed shifts: In: 154 [IV Piggyback:154] Out: 1850 [Urine:1850] Total I/O In: 120 [P.O.:120] Out: 400 [Urine:400]   Diet Order: Cardiac  Supplements/Tube Feeding:  none  IVF:    DISCONTD: sodium chloride Last Rate: 50 mL (03/10/12 1502)    Estimated Nutritional Needs:   Kcal: 1750-1900 Protein: 80-90 gm Fluid: >1.7L Food/Nutrition Related Hx: Thin man, 93# UBW with decreased intake prior to admit secondary to decreased appetite and poor fitting dentures.  Weight loss reported as well.  Pt s/p heart transplant.  Pt appears malnourished with decreased body fat and muscle mass.  NUTRITION DIAGNOSIS: -Inadequate oral intake (NI-2.1).  Status: Ongoing  RELATED TO:  poor appetite and poor fitting dentures  AS EVIDENCE BY: Intake of <75% meals  MONITORING/EVALUATION(Goals): Monitor:  Intake, weight, labs Goal:  Intake of >75% meals and supplements  EDUCATION NEEDS: -No education needs identified at this time  INTERVENTION: Continue Heart Healthy diet Ensure Complete bid  Dietitian [phone]  DOCUMENTATION CODES Per approved criteria  -Non-severe (  moderate) malnutrition in the context of chronic illness    Valora Piccolo Higinio Roger 03/11/2012, 1:49 PM

## 2012-03-11 NOTE — Progress Notes (Addendum)
[facility] PROGRESS NOTE  WENDELL NICOSON AVW:[id number] DOB: 1940-10-21 DOA: 03/10/2012 PCP: Gara Kroner, MD  Assessment/Plan: Active Problems: Healthcare associated pneumonia with acute hypoxic respiratory failure  Treated as HCAP given  his recent visit at 71 with echo and cardiac cath. Continue with IV Vanco, cefepime and Levaquin (day 2) I will order sputum culture and urine Legionella His repeat chest x-ray done yesterday shows some improvement in pneumonia. If symptoms do not improve overnight with persistent dyspnea and cough I will get a chest CT. -Continue O2 via nasal cannula and scheduled nebs      H/O heart transplant Follows with Dr. Katy Apo at [facility] clinic. He recently had a cardiac cath and to the echo done one week ago. I have called Dr. Katy Apo and left a message to get the reports. Continue cyclosporin and CellCept   Hypertension Stable continue Cardizem and Imdur     Diastolic CHF, acute on chronic His proBNP is mildly elevated Continue with Lasix , aspirin I will try to get the results of this is a recent echo from his cardiologist    ?COPD (chronic obstructive pulmonary disease) Continue O2 via nasal cannula and scheduled nebs  Code Status: Full code  Disposition Plan: Home once stable   Brief narrative:  71 y.o. male history of ischemic cardiomyopathy, status post heart transplant in 1996, on immunosuppressants, followed at [facility], hypertension, hyperlipidemia, heparin-induced thrombocytopenia, possible COPD presents to the emergency department secondary to worsening dyspnea. He was noted to have a left lingular infiltrate on chest x-ray from 8/27 in the ED but signed out AMA however returned 2 days back with persistent dyspnea and admitted for healthcare associated pneumonia.  Consultants:  Hospitalist discussed with Dr. Wendie Agreste over the phone  Procedures:  None  Antibiotics:  On IV vancomycin, cefepime and  levofloxacin  HPI/Subjective: Patient feels less short of breath today but still having cough with some whitish phlegm. Has remained afebrile  Objective: Filed Vitals:   03/11/12 0125 03/11/12 0624 03/11/12 0829 03/11/12 1348  BP:  112/73    Pulse:  74    Temp:  97.5 F (36.4 C)    TempSrc:  Oral    Resp:  18    Height:      Weight:  57.1 kg (125 lb 14.1 oz)    SpO2: 93% 91% 90% 90%    Intake/Output Summary (Last 24 hours) at 03/11/12 1508 Last data filed at 03/11/12 0900  Gross per 24 hour  Intake    274 ml  Output   2250 ml  Net  -1976 ml   Filed Weights   03/10/12 1900 03/11/12 0624  Weight: 58.6 kg (129 lb 3 oz) 57.1 kg (125 lb 14.1 oz)    Exam:   General:  Elderly male lying in bed in no acute distress  HEENT: no pallor, moist oral mucosa  Chest: Occasional rhonchi bilaterally, no crackles  Cardiovascular: Normal S1 and S2 no murmurs rub or gallop.  Abdomen: Soft, nontender nondistended bowel sounds present  Extremities: Warm no edema  CNS: aaox 3   Data Reviewed: Basic Metabolic Panel:  Lab 82/95/62 0410 03/10/12 1355  NA 139 135  K 3.8 4.4  CL 98 100  CO2 29 26  GLUCOSE 131* 75  BUN 21 20  CREATININE 1.20 1.07  CALCIUM 9.0 9.2  MG -- --  PHOS -- --   Liver Function Tests: No results found for this basename: AST:5,ALT:5,ALKPHOS:5,BILITOT:5,PROT:5,ALBUMIN:5 in the last 168 hours No results found for  this basename: LIPASE:5,AMYLASE:5 in the last 168 hours No results found for this basename: AMMONIA:5 in the last 168 hours CBC:  Lab 03/11/12 0410 03/10/12 1355  WBC 5.5 6.0  NEUTROABS -- 3.6  HGB 14.6 14.9  HCT 43.5 43.8  MCV 92.2 91.4  PLT 99* 95*   Cardiac Enzymes: No results found for this basename: CKTOTAL:5,CKMB:5,CKMBINDEX:5,TROPONINI:5 in the last 168 hours BNP (last 3 results)  Basename 03/10/12 1400  PROBNP 492.2*   CBG: No results found for this basename: GLUCAP:5 in the last 168 hours  Recent Results (from the past  240 hour(s))  CULTURE, BLOOD (ROUTINE X 2)     Status: Normal (Preliminary result)   Collection Time   03/10/12  1:55 PM      Component Value Range Status Comment   Specimen Description BLOOD LEFT ARM   Final    Special Requests BOTTLES DRAWN AEROBIC AND ANAEROBIC 4CC   Final    Culture  Setup Time 03/10/2012 20:44   Final    Culture     Final    Value:        BLOOD CULTURE RECEIVED NO GROWTH TO DATE CULTURE WILL BE HELD FOR 5 DAYS BEFORE ISSUING A FINAL NEGATIVE REPORT   Report Status PENDING   Incomplete   CULTURE, BLOOD (ROUTINE X 2)     Status: Normal (Preliminary result)   Collection Time   03/10/12  2:28 PM      Component Value Range Status Comment   Specimen Description BLOOD RIGHT ARM   Final    Special Requests BOTTLES DRAWN AEROBIC AND ANAEROBIC 5CC   Final    Culture  Setup Time 03/10/2012 20:44   Final    Culture     Final    Value:        BLOOD CULTURE RECEIVED NO GROWTH TO DATE CULTURE WILL BE HELD FOR 5 DAYS BEFORE ISSUING A FINAL NEGATIVE REPORT   Report Status PENDING   Incomplete   MRSA PCR SCREENING     Status: Normal   Collection Time   03/10/12  7:48 PM      Component Value Range Status Comment   MRSA by PCR NEGATIVE  NEGATIVE Final      Studies: Dg Chest 2 View  03/10/2012  *RADIOLOGY REPORT*  Clinical Data: Cough.  Shortness of breath.  History of heart transplant in 1996.  CHEST - 2 VIEW  Comparison: Two-view chest x-ray 03/08/2012 [facility], and 06/18/2009, 03/04/2009 [facility].  Findings: Prior sternotomy.  Cardiac silhouette upper normal in size.  Thoracic aorta tortuous and atherosclerotic, unchanged. Enlarged central pulmonary arteries, unchanged.  Hyperinflation, coarse reticular interstitial lung disease, chronic blunting of the costophrenic angles, scarring in the lower lobes, and calcified granuloma in the lingula, unchanged. Interval improvement in the opacities in the lingula and right lower lobe since the examination 2 days ago, with  minimal residual right lower lobe opacity persisting.  No new pulmonary parenchymal abnormalities. Degenerative changes involving the thoracic spine.  IMPRESSION: Improving pneumonia in the right lower lobe and lingula since the examination 2 days ago.  Stable COPD, chronic pleuroparenchymal scarring at the bases, and old granulomatous disease.  No new abnormalities.   Original Report Authenticated By: Deniece Portela, M.D.    Dg Chest 2 View  03/08/2012  *RADIOLOGY REPORT*  Clinical Data: Productive cough  CHEST - 2 VIEW  Comparison: 06/18/2009  Findings: Subtle infiltrate in the lingula on the lateral view. This may represent pneumonia and  is an interval change since the prior study.  Mild patchy density in the right lung base was not present previously and  could be due to atelectasis or infiltrate.  COPD with pulmonary hyperinflation and pulmonary scarring.  History of heart transplant.  Median sternotomy.  Heart size is unchanged. Negative for heart failure.  IMPRESSION: COPD.  Right lower lobe and lingular airspace disease, not present in 2010.  These areas may represent pneumonia or atelectasis.   Original Report Authenticated By: Truett Perna, M.D.     Scheduled Meds:   . albuterol  2.5 mg Nebulization Q6H  . aspirin EC  81 mg Oral Daily  . ceFEPime (MAXIPIME) IV  2 g Intravenous Q12H  . cycloSPORINE modified  75 mg Oral BID  . diltiazem  240 mg Oral Daily  . famotidine  20 mg Oral Daily  . furosemide  40 mg Intravenous BID  . ipratropium  0.5 mg Nebulization Q6H  . isosorbide mononitrate  45 mg Oral QPM  . levofloxacin (LEVAQUIN) IV  750 mg Intravenous Once  . levofloxacin (LEVAQUIN) IV  750 mg Intravenous Q48H  . loratadine  10 mg Oral Daily  . magnesium oxide  800 mg Oral BID  . mycophenolate  1,500 mg Oral Once  . mycophenolate  1,500 mg Oral Once  . mycophenolate  1,500 mg Oral BID  . omega-3 acid ethyl esters  1 g Oral QPM  . sodium chloride  3 mL Intravenous Q12H  .  vancomycin  500 mg Intravenous Q12H  . DISCONTD: Fish Oil  1 capsule Oral QPM  . DISCONTD: levofloxacin (LEVAQUIN) IV  750 mg Intravenous Q24H  . DISCONTD: levofloxacin (LEVAQUIN) IV  750 mg Intravenous Q48H  . DISCONTD: mycophenolate  1,500 mg Oral BID  . DISCONTD: mycophenolate  1,500 mg Oral BID   Continuous Infusions:   . DISCONTD: sodium chloride 50 mL (03/10/12 1502)      Time spent: 30 minutes    Emberleigh Reily, [place]  [facility] Pager [phone]. If 8PM-8AM, please contact night-coverage at [email or web address], password [facility] 03/11/2012, 3:08 PM  LOS: 1 day

## 2012-03-11 NOTE — Progress Notes (Signed)
   CARE MANAGEMENT NOTE 03/11/2012  Patient:  Luke Jacobson, Luke Jacobson   Account Number:  [id number]  Date Initiated:  03/11/2012  Documentation initiated by:  Olga Coaster  Subjective/Objective Assessment:   ADMITTED WITH PNEUMONIA     Action/Plan:   PCP: Gara Kroner, MD  status post heart transplant in 1996, followed at [facility]; lives at home with spouse   Anticipated DC Date:  03/18/2012   Anticipated DC Plan:  [place]  CM consult            Status of service:  In process, will continue to follow Medicare Important Message given?  NA - LOS <3 / Initial given by admissions (If response is "NO", the following Medicare IM given date fields will be blank)  Per UR Regulation:  Reviewed for med. necessity/level of care/duration of stay  Comments:  03/11/2012- B Naia Ruff RN, BSN, MHA

## 2012-03-12 ENCOUNTER — Inpatient Hospital Stay (HOSPITAL_COMMUNITY): Payer: Medicare Other

## 2012-03-12 DIAGNOSIS — J159 Unspecified bacterial pneumonia: Secondary | ICD-10-CM | POA: Diagnosis present

## 2012-03-12 DIAGNOSIS — R0902 Hypoxemia: Secondary | ICD-10-CM

## 2012-03-12 DIAGNOSIS — I2510 Atherosclerotic heart disease of native coronary artery without angina pectoris: Secondary | ICD-10-CM

## 2012-03-12 MED ORDER — VANCOMYCIN HCL IN DEXTROSE 1-5 GM/200ML-% IV SOLN
1000.0000 mg | Freq: Two times a day (BID) | INTRAVENOUS | Status: DC
  Administered 2012-03-12 – 2012-03-13 (×2): 1000 mg via INTRAVENOUS
  Filled 2012-03-12 (×3): qty 200

## 2012-03-12 MED ORDER — ONDANSETRON HCL 4 MG PO TABS: 4.0000 mg | ORAL_TABLET | Freq: Three times a day (TID) | ORAL | Status: DC | PRN

## 2012-03-12 MED ORDER — PREDNISONE 20 MG PO TABS
40.0000 mg | ORAL_TABLET | Freq: Every day | ORAL | Status: DC
  Administered 2012-03-12 – 2012-03-13 (×2): 40 mg via ORAL
  Filled 2012-03-12 (×3): qty 2

## 2012-03-12 MED ORDER — ALBUTEROL SULFATE HFA 108 (90 BASE) MCG/ACT IN AERS
2.0000 | INHALATION_SPRAY | Freq: Four times a day (QID) | RESPIRATORY_TRACT | Status: DC | PRN
  Filled 2012-03-12: qty 6.7

## 2012-03-12 MED ORDER — IOHEXOL 350 MG/ML SOLN
100.0000 mL | Freq: Once | INTRAVENOUS | Status: AC | PRN
  Administered 2012-03-12: 100 mL via INTRAVENOUS

## 2012-03-12 MED ORDER — MYCOPHENOLATE MOFETIL 250 MG PO CAPS
1500.0000 mg | ORAL_CAPSULE | Freq: Two times a day (BID) | ORAL | Status: DC
  Administered 2012-03-12 – 2012-03-13 (×2): 1500 mg via ORAL
  Filled 2012-03-12 (×3): qty 6

## 2012-03-12 MED ORDER — MYCOPHENOLATE MOFETIL 500 MG PO TABS
1500.0000 mg | ORAL_TABLET | Freq: Two times a day (BID) | ORAL | Status: DC
  Administered 2012-03-12: 1500 mg via ORAL
  Filled 2012-03-12 (×2): qty 3

## 2012-03-12 NOTE — Progress Notes (Addendum)
ANTIBIOTIC CONSULT NOTE - FOLLOW UP  Pharmacy Consult for Vancomycin, Levaquin, Cefepime Indication: Suspected PNA  Allergies  Allergen Reactions  . Heparin     Made white blood count go down. HIT    Patient Measurements: Height: _0  (170.2 cm) Weight: 125 lb 14.1 oz (57.1 kg) IBW/kg (Calculated) : 66.1   Vital Signs: Temp: 97.9 F (36.6 C) (08/31 0558) Temp src: Oral (08/31 0558) BP: 125/75 mmHg (08/31 0558) Pulse Rate: 87  (08/31 0558) Intake/Output from previous day: 08/30 0701 - 08/31 0700 In: 760 [P.O.:460; IV Piggyback:300] Out: 1180 [Urine:1180] Intake/Output from this shift: Total I/O In: 240 [P.O.:240] Out: -   Labs:  Basename 03/11/12 1610 03/11/12 0410 03/10/12 1355  WBC 6.5 5.5 6.0  HGB 15.3 14.6 14.9  PLT 110* 99* 95*  LABCREA -- -- --  CREATININE -- 1.20 1.07   Estimated Creatinine Clearance: 46.3 ml/min (by C-G formula based on Cr of 1.2).  Basename 03/12/12 0820  VANCOTROUGH 7.8*  VANCOPEAK --  Luke Jacobson --  GENTTROUGH --  GENTPEAK --  GENTRANDOM --  TOBRATROUGH --  TOBRAPEAK --  TOBRARND --  AMIKACINPEAK --  AMIKACINTROU --  AMIKACIN --     Microbiology: Recent Results (from the past 720 hour(s))  CULTURE, BLOOD (ROUTINE X 2)     Status: Normal (Preliminary result)   Collection Time   03/10/12  1:55 PM      Component Value Range Status Comment   Specimen Description BLOOD LEFT ARM   Final    Special Requests BOTTLES DRAWN AEROBIC AND ANAEROBIC 4CC   Final    Culture  Setup Time 03/10/2012 20:44   Final    Culture     Final    Value:        BLOOD CULTURE RECEIVED NO GROWTH TO DATE CULTURE WILL BE HELD FOR 5 DAYS BEFORE ISSUING A FINAL NEGATIVE REPORT   Report Status PENDING   Incomplete   CULTURE, BLOOD (ROUTINE X 2)     Status: Normal (Preliminary result)   Collection Time   03/10/12  2:28 PM      Component Value Range Status Comment   Specimen Description BLOOD RIGHT ARM   Final    Special Requests BOTTLES DRAWN AEROBIC AND  ANAEROBIC 5CC   Final    Culture  Setup Time 03/10/2012 20:44   Final    Culture     Final    Value:        BLOOD CULTURE RECEIVED NO GROWTH TO DATE CULTURE WILL BE HELD FOR 5 DAYS BEFORE ISSUING A FINAL NEGATIVE REPORT   Report Status PENDING   Incomplete   MRSA PCR SCREENING     Status: Normal   Collection Time   03/10/12  7:48 PM      Component Value Range Status Comment   MRSA by PCR NEGATIVE  NEGATIVE Final   CULTURE, EXPECTORATED SPUTUM-ASSESSMENT     Status: Normal   Collection Time   03/11/12  6:20 PM      Component Value Range Status Comment   Specimen Description SPUTUM   Final    Special Requests NONE   Final    Sputum evaluation     Final    Value: THIS SPECIMEN IS ACCEPTABLE. RESPIRATORY CULTURE REPORT TO FOLLOW.   Report Status 03/11/2012 FINAL   Final     Anti-infectives     Start     Dose/Rate Route Frequency Ordered Stop   03/12/12 1500   levofloxacin (LEVAQUIN) IVPB  750 mg  Status:  Discontinued        750 mg 100 mL/hr over 90 Minutes Intravenous Every 48 hours 03/11/12 1113 03/11/12 1116   03/12/12 1500   levofloxacin (LEVAQUIN) IVPB 750 mg        750 mg 100 mL/hr over 90 Minutes Intravenous Every 48 hours 03/11/12 1116     03/11/12 1600   levofloxacin (LEVAQUIN) IVPB 750 mg  Status:  Discontinued        750 mg 100 mL/hr over 90 Minutes Intravenous Every 24 hours 03/10/12 1956 03/11/12 1113   03/10/12 2100   vancomycin (VANCOCIN) 500 mg in sodium chloride 0.9 % 100 mL IVPB        500 mg 100 mL/hr over 60 Minutes Intravenous Every 12 hours 03/10/12 2005     03/10/12 2000   ceFEPIme (MAXIPIME) 2 g in dextrose 5 % 50 mL IVPB        2 g 100 mL/hr over 30 Minutes Intravenous Every 12 hours 03/10/12 1952     03/10/12 1400   levofloxacin (LEVAQUIN) IVPB 750 mg        750 mg 100 mL/hr over 90 Minutes Intravenous  Once 03/10/12 1349 03/10/12 1632          Assessment: 71 yo immunosuppressed male with a hx of heart transplant in 1996 on Day # 3 Cefepime,  Vanco, and Levaquin for suspected HCAP (recent visit at [facility]). Vanco trough subtherapeutic this am. Will increase dose and recheck trough at new steady state.  Goal of Therapy:  Vancomycin trough level 15-20 mcg/ml  Plan:  1) Increase Vanco to 1g IV q12h 2) Continue current cefepime and Levaquin doses 3) Watch renal function and adjust abx PRN 4) BMET in am to assess renal function  Luke Jacobson, PharmD Pager: [phone] 03/12/2012,10:09 AM

## 2012-03-12 NOTE — Progress Notes (Signed)
Pt was taking brand name CellCept medication at home, 3 tablets.  Pt was asked to bring in home med since Pharmacy only carries generic- Mycophenolate.  Pt stated that he had forgot to ask his wife to bring in the medication from home.  RN asked pt if there was a specific reason that he needed to take brand name only and pt denied any reason.  Pt stated that it would be okay if he took what the hospital Rx had (generic), even though he would be taking 6 capsules instead of 3 tablets like he does at home.  Notified Pharmacist who then paged NP on call to get med order switched.  Pt aware and in agreement with the above.

## 2012-03-12 NOTE — Progress Notes (Signed)
Pt 02 sat 82-84% on RA while resting. )2 via nasal cannula applied on 3L with 02 sat of 91-92%

## 2012-03-12 NOTE — Progress Notes (Signed)
[facility] PROGRESS NOTE  JAMORI BIGGAR BWL:[id number] DOB: [birth year] DOA: 03/10/2012 PCP: Gara Kroner, MD   Assessment/Plan:  Active Problems:  Healthcare associated pneumonia with acute hypoxic respiratory failure  Treated as HCAP given his recent visit at 51 with echo and cardiac cath.  Continue with IV Vanco, cefepime and Levaquin (day 3)  - sputum culture and urine Legionella  His repeat chest x-ray done shows some improvement in pneumonia.  -his sats are in 80s on RA and improves to 92 on 3L. Will get CT angio chest to r/o underlying PE vs chronic lung disease -Continue O2 via nasal cannula and scheduled nebs   H/O heart transplant  Follows with Dr. Katy Apo at [facility] clinic. He recently had a cardiac cath and to the echo done one week ago. I have called Dr. Katy Apo and left a message to get the reports.  Continue cyclosporin and CellCept   Hypertension  Stable continue Cardizem and Imdur   Diastolic CHF, acute on chronic  His proBNP is mildly elevated  Continue with Lasix , aspirin  i was unable to contact his cardiologist Dr Katy Apo. Will call again on Tuesday if patient in the hospital  ?COPD (chronic obstructive pulmonary disease)  Continue O2 via nasal cannula and scheduled nebs   Code Status: Full code  Disposition Plan: Home once stable   Brief narrative:  71 y.o. male history of ischemic cardiomyopathy, status post heart transplant in 1996, on immunosuppressants, followed at [facility], hypertension, hyperlipidemia, heparin-induced thrombocytopenia, possible COPD presents to the emergency department secondary to worsening dyspnea. He was noted to have a left lingular infiltrate on chest x-ray from 8/27 in the ED but signed out AMA however returned 2 days back with persistent dyspnea and admitted for healthcare associated pneumonia.   Consultants:  Hospitalist discussed with Dr. Wendie Agreste over the phone  Procedures:  None  Antibiotics:  On IV vancomycin,  cefepime and levofloxacin ( day 3) HPI/Subjective:  Has productive cough. Feels less dyspnic  Objective:   Objective: Filed Vitals:   03/11/12 2103 03/12/12 0558 03/12/12 0943 03/12/12 1250  BP: 111/77 125/75    Pulse: 92 87    Temp: 97.5 F (36.4 C) 97.9 F (36.6 C)    TempSrc: Oral Oral    Resp: 18 18    Height:      Weight:      SpO2: 93% 90% 92% 94%    Intake/Output Summary (Last 24 hours) at 03/12/12 1344 Last data filed at 03/12/12 0856  Gross per 24 hour  Intake    730 ml  Output    380 ml  Net    350 ml   Filed Weights   03/10/12 1900 03/11/12 0624  Weight: 58.6 kg (129 lb 3 oz) 57.1 kg (125 lb 14.1 oz)    Exam:  General: Elderly male lying in bed in no acute distress  HEENT: no pallor, moist oral mucosa  Chest: Occasional rhonchi bilaterally, no crackles  Cardiovascular: Normal S1 and S2 no murmurs rub or gallop.  Abdomen: Soft, nontender nondistended bowel sounds present  Extremities: Warm no edema  CNS: AAOX 3   Data Reviewed: Basic Metabolic Panel:  Lab 68/11/57 0410 03/10/12 1355  NA 139 135  K 3.8 4.4  CL 98 100  CO2 29 26  GLUCOSE 131* 75  BUN 21 20  CREATININE 1.20 1.07  CALCIUM 9.0 9.2  MG -- --  PHOS -- --   Liver Function Tests: No results found for this basename:  AST:5,ALT:5,ALKPHOS:5,BILITOT:5,PROT:5,ALBUMIN:5 in the last 168 hours No results found for this basename: LIPASE:5,AMYLASE:5 in the last 168 hours No results found for this basename: AMMONIA:5 in the last 168 hours CBC:  Lab 03/11/12 1610 03/11/12 0410 03/10/12 1355  WBC 6.5 5.5 6.0  NEUTROABS -- -- 3.6  HGB 15.3 14.6 14.9  HCT 44.4 43.5 43.8  MCV 90.6 92.2 91.4  PLT 110* 99* 95*   Cardiac Enzymes: No results found for this basename: CKTOTAL:5,CKMB:5,CKMBINDEX:5,TROPONINI:5 in the last 168 hours BNP (last 3 results)  Basename 03/10/12 1400  PROBNP 492.2*   CBG: No results found for this basename: GLUCAP:5 in the last 168 hours  Recent Results (from the  past 240 hour(s))  CULTURE, BLOOD (ROUTINE X 2)     Status: Normal (Preliminary result)   Collection Time   03/10/12  1:55 PM      Component Value Range Status Comment   Specimen Description BLOOD LEFT ARM   Final    Special Requests BOTTLES DRAWN AEROBIC AND ANAEROBIC 4CC   Final    Culture  Setup Time 03/10/2012 20:44   Final    Culture     Final    Value:        BLOOD CULTURE RECEIVED NO GROWTH TO DATE CULTURE WILL BE HELD FOR 5 DAYS BEFORE ISSUING A FINAL NEGATIVE REPORT   Report Status PENDING   Incomplete   CULTURE, BLOOD (ROUTINE X 2)     Status: Normal (Preliminary result)   Collection Time   03/10/12  2:28 PM      Component Value Range Status Comment   Specimen Description BLOOD RIGHT ARM   Final    Special Requests BOTTLES DRAWN AEROBIC AND ANAEROBIC 5CC   Final    Culture  Setup Time 03/10/2012 20:44   Final    Culture     Final    Value:        BLOOD CULTURE RECEIVED NO GROWTH TO DATE CULTURE WILL BE HELD FOR 5 DAYS BEFORE ISSUING A FINAL NEGATIVE REPORT   Report Status PENDING   Incomplete   MRSA PCR SCREENING     Status: Normal   Collection Time   03/10/12  7:48 PM      Component Value Range Status Comment   MRSA by PCR NEGATIVE  NEGATIVE Final   CULTURE, EXPECTORATED SPUTUM-ASSESSMENT     Status: Normal   Collection Time   03/11/12  6:20 PM      Component Value Range Status Comment   Specimen Description SPUTUM   Final    Special Requests NONE   Final    Sputum evaluation     Final    Value: THIS SPECIMEN IS ACCEPTABLE. RESPIRATORY CULTURE REPORT TO FOLLOW.   Report Status 03/11/2012 FINAL   Final      Studies: Dg Chest 2 View  03/10/2012  *RADIOLOGY REPORT*  Clinical Data: Cough.  Shortness of breath.  History of heart transplant in 1996.  CHEST - 2 VIEW  Comparison: Two-view chest x-ray 03/08/2012 [facility], and 06/18/2009, 03/04/2009 [facility].  Findings: Prior sternotomy.  Cardiac silhouette upper normal in size.  Thoracic aorta tortuous and  atherosclerotic, unchanged. Enlarged central pulmonary arteries, unchanged.  Hyperinflation, coarse reticular interstitial lung disease, chronic blunting of the costophrenic angles, scarring in the lower lobes, and calcified granuloma in the lingula, unchanged. Interval improvement in the opacities in the lingula and right lower lobe since the examination 2 days ago, with minimal residual right lower lobe opacity  persisting.  No new pulmonary parenchymal abnormalities. Degenerative changes involving the thoracic spine.  IMPRESSION: Improving pneumonia in the right lower lobe and lingula since the examination 2 days ago.  Stable COPD, chronic pleuroparenchymal scarring at the bases, and old granulomatous disease.  No new abnormalities.   Original Report Authenticated By: Deniece Portela, M.D.    Dg Chest 2 View  03/08/2012  *RADIOLOGY REPORT*  Clinical Data: Productive cough  CHEST - 2 VIEW  Comparison: 06/18/2009  Findings: Subtle infiltrate in the lingula on the lateral view. This may represent pneumonia and is an interval change since the prior study.  Mild patchy density in the right lung base was not present previously and  could be due to atelectasis or infiltrate.  COPD with pulmonary hyperinflation and pulmonary scarring.  History of heart transplant.  Median sternotomy.  Heart size is unchanged. Negative for heart failure.  IMPRESSION: COPD.  Right lower lobe and lingular airspace disease, not present in 2010.  These areas may represent pneumonia or atelectasis.   Original Report Authenticated By: Truett Perna, M.D.     Scheduled Meds:   . albuterol  2.5 mg Nebulization Q4H  . aspirin EC  81 mg Oral Daily  . ceFEPime (MAXIPIME) IV  2 g Intravenous Q12H  . cycloSPORINE modified  75 mg Oral BID  . diltiazem  240 mg Oral Daily  . famotidine  20 mg Oral Daily  . feeding supplement  237 mL Oral BID BM  . furosemide  40 mg Intravenous BID  . ipratropium  0.5 mg Nebulization QID  . isosorbide  mononitrate  45 mg Oral QPM  . levofloxacin (LEVAQUIN) IV  750 mg Intravenous Q48H  . loratadine  10 mg Oral Daily  . magnesium oxide  800 mg Oral BID  . mycophenolate  1,500 mg Oral Once  . mycophenolate  1,500 mg Oral BID  . omega-3 acid ethyl esters  1 g Oral QPM  . sodium chloride  3 mL Intravenous Q12H  . vancomycin  1,000 mg Intravenous Q12H  . DISCONTD: albuterol  2.5 mg Nebulization Q6H  . DISCONTD: ipratropium  0.5 mg Nebulization Q6H  . DISCONTD: mycophenolate  1,500 mg Oral Once  . DISCONTD: mycophenolate  1,500 mg Oral BID  . DISCONTD: vancomycin  500 mg Intravenous Q12H   Continuous Infusions:      Time spent: 30 minutes    Danean Marner  [facility] Pager [phone]. If 8PM-8AM, please contact night-coverage at [email or web address], password [facility] 03/12/2012, 1:44 PM  LOS: 2 days

## 2012-03-13 DIAGNOSIS — N179 Acute kidney failure, unspecified: Secondary | ICD-10-CM | POA: Diagnosis not present

## 2012-03-13 LAB — BASIC METABOLIC PANEL
BUN: 28 mg/dL — ABNORMAL HIGH (ref 6–23)
Chloride: 89 mEq/L — ABNORMAL LOW (ref 96–112)
GFR calc Af Amer: 54 mL/min — ABNORMAL LOW (ref >90)
Potassium: 3.9 mEq/L (ref 3.5–5.1)

## 2012-03-13 LAB — LEGIONELLA ANTIGEN, URINE

## 2012-03-13 MED ORDER — PREDNISONE 20 MG PO TABS: 40.0000 mg | ORAL_TABLET | Freq: Every day | ORAL | Status: AC

## 2012-03-13 MED ORDER — GUAIFENESIN-DM 100-10 MG/5ML PO SYRP: 5.0000 mL | ORAL_SOLUTION | ORAL | Status: AC | PRN

## 2012-03-13 MED ORDER — FLUTICASONE-SALMETEROL 250-50 MCG/DOSE IN AEPB: 1.0000 | INHALATION_SPRAY | Freq: Two times a day (BID) | RESPIRATORY_TRACT | Status: DC

## 2012-03-13 MED ORDER — LEVOFLOXACIN 750 MG PO TABS: 750.0000 mg | ORAL_TABLET | Freq: Every day | ORAL | Status: AC

## 2012-03-13 MED ORDER — ALBUTEROL SULFATE HFA 108 (90 BASE) MCG/ACT IN AERS: 2.0000 | INHALATION_SPRAY | Freq: Four times a day (QID) | RESPIRATORY_TRACT | Status: DC | PRN

## 2012-03-13 MED ORDER — TIOTROPIUM BROMIDE MONOHYDRATE 18 MCG IN CAPS: 18.0000 ug | ORAL_CAPSULE | Freq: Every day | RESPIRATORY_TRACT | Status: DC

## 2012-03-13 NOTE — Progress Notes (Signed)
Pt 02 sat is 90-92% on RA while resting, 89% when standing up, then pt started ambulating & 02 sat stayed 87-89% & 90% with period of rest & deep breaths.

## 2012-03-13 NOTE — Care Management Note (Signed)
    Page 1 of 1   03/13/2012     11:45:15 AM   CARE MANAGEMENT NOTE 03/13/2012  Patient:  Luke Jacobson, Luke Jacobson   Account Number:  [id number]  Date Initiated:  03/11/2012  Documentation initiated by:  Olga Coaster  Subjective/Objective Assessment:   ADMITTED WITH PNEUMONIA     Action/Plan:   PCP: Gara Kroner, MD  status post heart transplant in 1996, followed at [facility]; lives at home with spouse   Anticipated DC Date:  03/18/2012   Anticipated DC Plan:  [place]  CM consult      Choice offered to / List presented to:             Status of service:  In process, will continue to follow Medicare Important Message given?  NA - LOS <3 / Initial given by admissions (If response is "NO", the following Medicare IM given date fields will be blank) Date Medicare IM given:   Date Additional Medicare IM given:    Discharge Disposition:    Per UR Regulation:  Reviewed for med. necessity/level of care/duration of stay  If discussed at Long Length of Stay Meetings, dates discussed:    Comments:  03/13/12 11:40 Per Dr Clementeen Graham pt needs home oxygen therapy with exercise.  In to see patient he wishes to hold on home oxygen until he sees his physician on 03/15/12.  Dr Clementeen Graham aware. Georgana Curio RN CCM MHA.  03/11/2012- Mindi Slicker RN, BSN, MHA

## 2012-03-13 NOTE — Discharge Summary (Signed)
Physician Discharge Summary  Luke Jacobson ATF:[id number] DOB: Feb 13, 1941 DOA: 03/10/2012  PCP: Gara Kroner, MD  Admit date: 03/10/2012 Discharge date: 03/13/2012  Recommendations for Outpatient Follow-up:  1. Home with outpatient follow up in 1 week .needs BMET checked . Also needs outpatient pulmonary referral for his COPD and PFTs  Discharge Diagnoses:  Principal Problem:  *Healthcare Associated Bacterial Pneumonia   Active Problems:  CAD (coronary artery disease)  H/O heart transplant  Hypertension  Respiratory failure, acute  COPD (chronic obstructive pulmonary disease)  HTN (hypertension)   Discharge Condition: fair  Diet recommendation: cardiac  Filed Weights   03/10/12 1900 03/11/12 0624 03/13/12 0524  Weight: 58.6 kg (129 lb 3 oz) 57.1 kg (125 lb 14.1 oz) 57.153 kg (126 lb)    History of present illness:  71 y.o. male history of ischemic cardiomyopathy, status post heart transplant in 1996, on immunosuppressants, followed at [facility], hypertension, hyperlipidemia, heparin-induced thrombocytopenia, possible COPD presents to the emergency department secondary to worsening dyspnea. He was noted to have a left lingular infiltrate on chest x-ray from 8/27 in the ED but signed out AMA however returned 2 days back with persistent dyspnea and admitted for healthcare associated pneumonia.     Hospital Course:  Healthcare associated pneumonia with acute hypoxic respiratory failure  Treated as HCAP given his recent visit at [place] with echo and cardiac cath.  -given  IV Vanco, cefepime and Levaquin (day 3)  - sputum culture and urine Legionella negative His repeat chest x-ray done shows some improvement in pneumonia.  -he is clinically stable and will be discharged on a course of po levaquin to complete a 7 day course.  COPD exacerbation  no documented hx of COPD  -his sats were in 53s on RA and improves to 92 on 3L. CT angio chest to r/o underlying PE vs chronic lung  disease showed emphysematous changes and pulm HTN -Improved with nebs. He has underlying COPD and has symptoms of progressive SOB for several weeks to months.   i have started him on a short course fo po prednisone for COPD exacerbation. i will discharge him on albuterol puffs, advair and spiriva. He needs to see a pulmonologlist as outpatient ad schedule for a PFT. -his o2 sats are 92 on RA but drops to 87 on ambulation. Recommended for o2 via [place] on ambulation on discharge but patient refused and  wants to see his PCP and pulmonologist before deciding on home o2.   H/O heart transplant  Follows with Dr. Katy Apo at [facility] clinic. He recently had a cardiac cath and to the echo done one week ago. He informs being told that his echo was normal. I could not reach his cardiologist  called Dr. Katy Apo and left a message to get the reports.  Continue cyclosporin and CellCept   Hypertension  Stable continue Cardizem and Imdur   Mild AKI  noted in am labs of creatine fo 1.4 and Na of 130. Likely from lasix use for suspected diastolic dysfn. Encouraged on increased fluid intake. Will not place him on lasix ( takes only occasionally at home)  he needs repeat BMET within 1 week as outpt.      Procedures:  none  Consultations:  none  Discharge Exam: Filed Vitals:   03/13/12 0524  BP: 143/76  Pulse: 91  Temp: 97.9 F (36.6 C)  Resp: 20   Filed Vitals:   03/13/12 0510 03/13/12 0524 03/13/12 0901 03/13/12 1206  BP:  143/76    Pulse:  91    Temp:  97.9 F (36.6 C)    TempSrc:  Oral    Resp:  20    Height:      Weight:  57.153 kg (126 lb)    SpO2: 95% 91% 92% 89%   General: Elderly male lying in bed in no acute distress  HEENT: no pallor, moist oral mucosa  Chest: Occasional rhonchi bilaterally, no crackles  Cardiovascular: Normal S1 and S2 no murmurs rub or gallop.  Abdomen: Soft, nontender nondistended bowel sounds present  Extremities: Warm no edema  CNS: AAOX 3   Discharge  Instructions   Medication List  As of 03/13/2012  2:24 PM   TAKE these medications         acetaminophen 500 MG tablet   Commonly known as: TYLENOL   Take 500 mg by mouth daily.      albuterol 108 (90 BASE) MCG/ACT inhaler   Commonly known as: PROVENTIL HFA;VENTOLIN HFA   Inhale 2 puffs into the lungs every 6 (six) hours as needed for wheezing or shortness of breath.      aspirin EC 81 MG tablet   Take 81 mg by mouth daily.      cycloSPORINE modified 25 MG capsule   Commonly known as: NEORAL   Take 75 mg by mouth 2 (two) times daily.      diltiazem 240 MG 24 hr capsule   Commonly known as: TIAZAC   Take 240 mg by mouth daily.      fexofenadine 180 MG tablet   Commonly known as: ALLEGRA   Take 180 mg by mouth daily.      Fish Oil 1000 MG Caps   Take 1 capsule by mouth every evening.      Fluticasone-Salmeterol 250-50 MCG/DOSE Aepb   Commonly known as: ADVAIR   Inhale 1 puff into the lungs 2 (two) times daily.      GARLIC PO   Take 1 tablet by mouth daily.      guaiFENesin-dextromethorphan 100-10 MG/5ML syrup   Commonly known as: ROBITUSSIN DM   Take 5 mLs by mouth every 4 (four) hours as needed for cough.      isosorbide mononitrate 30 MG 24 hr tablet   Commonly known as: IMDUR   Take 45 mg by mouth every evening.      levofloxacin 750 MG tablet   Commonly known as: LEVAQUIN   Take 1 tablet (750 mg total) by mouth daily.for 3 days      magnesium oxide 400 MG tablet   Commonly known as: MAG-OX   Take 800 mg by mouth 2 (two) times daily.      mycophenolate 500 MG tablet   Commonly known as: CELLCEPT   Take 1,500 mg by mouth 2 (two) times daily.      predniSONE 20 MG tablet   Commonly known as: DELTASONE   Take 2 tablets (40 mg total) by mouth daily with breakfast.for 4 days      ranitidine 75 MG tablet   Commonly known as: ZANTAC   Take 150 mg by mouth daily.      temazepam 15 MG capsule   Commonly known as: RESTORIL   Take 30 mg by mouth at bedtime as  needed. For sleep      tiotropium 18 MCG inhalation capsule   Commonly known as: SPIRIVA   Place 1 capsule (18 mcg total) into inhaler and inhale daily.           Follow-up  Information    Follow up with Gara Kroner, MD.   Contact information:   [place] [phone]           The results of significant diagnostics from this hospitalization (including imaging, microbiology, ancillary and laboratory) are listed below for reference.    Significant Diagnostic Studies: Dg Chest 2 View  03/10/2012  *RADIOLOGY REPORT*  Clinical Data: Cough.  Shortness of breath.  History of heart transplant in 1996.  CHEST - 2 VIEW  Comparison: Two-view chest x-ray 03/08/2012 [facility], and 06/18/2009, 03/04/2009 [facility].  Findings: Prior sternotomy.  Cardiac silhouette upper normal in size.  Thoracic aorta tortuous and atherosclerotic, unchanged. Enlarged central pulmonary arteries, unchanged.  Hyperinflation, coarse reticular interstitial lung disease, chronic blunting of the costophrenic angles, scarring in the lower lobes, and calcified granuloma in the lingula, unchanged. Interval improvement in the opacities in the lingula and right lower lobe since the examination 2 days ago, with minimal residual right lower lobe opacity persisting.  No new pulmonary parenchymal abnormalities. Degenerative changes involving the thoracic spine.  IMPRESSION: Improving pneumonia in the right lower lobe and lingula since the examination 2 days ago.  Stable COPD, chronic pleuroparenchymal scarring at the bases, and old granulomatous disease.  No new abnormalities.   Original Report Authenticated By: Deniece Portela, M.D.    Dg Chest 2 View  03/08/2012  *RADIOLOGY REPORT*  Clinical Data: Productive cough  CHEST - 2 VIEW  Comparison: 06/18/2009  Findings: Subtle infiltrate in the lingula on the lateral view. This may represent pneumonia and is an interval  change since the prior study.  Mild patchy density in the right lung base was not present previously and  could be due to atelectasis or infiltrate.  COPD with pulmonary hyperinflation and pulmonary scarring.  History of heart transplant.  Median sternotomy.  Heart size is unchanged. Negative for heart failure.  IMPRESSION: COPD.  Right lower lobe and lingular airspace disease, not present in 2010.  These areas may represent pneumonia or atelectasis.   Original Report Authenticated By: Truett Perna, M.D.    Ct Angio Chest Pe W/cm &/or Wo Cm  03/12/2012  *RADIOLOGY REPORT*  Clinical Data: Short of breath.  Infiltrates on chest radiograph. Heart transplant.  CT ANGIOGRAPHY CHEST  Technique:  Multidetector CT imaging of the chest using the standard protocol during bolus administration of intravenous contrast. Multiplanar reconstructed images including MIPs were obtained and reviewed to evaluate the vascular anatomy.  Contrast: 185m OMNIPAQUE IOHEXOL 350 MG/ML SOLN  Comparison: 03/10/2012.  Findings: Postoperative changes compatible with cardiac transplantation.  Median sternotomy. Coronary artery atherosclerosis is present. If office based assessment of coronary risk factors has not been performed, it is now recommended.  Small right and trace left pericardial effusion.  Aneurysmal dilation of the ascending aorta is present, measuring 41 mm.  This is below the level of the anastomosis.  No axillary adenopathy.  No mediastinal or hilar adenopathy. Incidental imaging of the upper abdomen shows old granulomatous disease of the liver.  Emphysema is present.  There is apical interlobular septal thickening compatible with interstitial pulmonary edema. Bronchitic changes are present in the lower lobes.  Scattered calcified granulomata are present diffusely in the lungs.  No aggressive osseous lesion.  Multilevel thoracic spondylosis. Enlargement pulmonary arteries compatible with pulmonary arterial hypertension. Reflux  of contrast into the inferior vena cava is observed.  IMPRESSION: 1. No pulmonary embolus.  No acute aortic abnormality. 2.  Aneurysmal  dilation of the proximal ascending aorta, measuring 41 mm.  No acute aortic abnormality. 3.  Emphysema with interstitial thickening, most compatible with interstitial pulmonary edema. Small right and trace left pleural effusion. 4.  Old granulomatous disease. 5.  Pulmonary arterial hypertension. 6.  Coronary artery and aortic atherosclerosis.   Original Report Authenticated By: Dereck Ligas, M.D.     Microbiology: Recent Results (from the past 240 hour(s))  CULTURE, BLOOD (ROUTINE X 2)     Status: Normal (Preliminary result)   Collection Time   03/10/12  1:55 PM      Component Value Range Status Comment   Specimen Description BLOOD LEFT ARM   Final    Special Requests BOTTLES DRAWN AEROBIC AND ANAEROBIC 4CC   Final    Culture  Setup Time 03/10/2012 20:44   Final    Culture     Final    Value:        BLOOD CULTURE RECEIVED NO GROWTH TO DATE CULTURE WILL BE HELD FOR 5 DAYS BEFORE ISSUING A FINAL NEGATIVE REPORT   Report Status PENDING   Incomplete   CULTURE, BLOOD (ROUTINE X 2)     Status: Normal (Preliminary result)   Collection Time   03/10/12  2:28 PM      Component Value Range Status Comment   Specimen Description BLOOD RIGHT ARM   Final    Special Requests BOTTLES DRAWN AEROBIC AND ANAEROBIC 5CC   Final    Culture  Setup Time 03/10/2012 20:44   Final    Culture     Final    Value:        BLOOD CULTURE RECEIVED NO GROWTH TO DATE CULTURE WILL BE HELD FOR 5 DAYS BEFORE ISSUING A FINAL NEGATIVE REPORT   Report Status PENDING   Incomplete   MRSA PCR SCREENING     Status: Normal   Collection Time   03/10/12  7:48 PM      Component Value Range Status Comment   MRSA by PCR NEGATIVE  NEGATIVE Final   CULTURE, EXPECTORATED SPUTUM-ASSESSMENT     Status: Normal   Collection Time   03/11/12  6:20 PM      Component Value Range Status Comment   Specimen  Description SPUTUM   Final    Special Requests NONE   Final    Sputum evaluation     Final    Value: THIS SPECIMEN IS ACCEPTABLE. RESPIRATORY CULTURE REPORT TO FOLLOW.   Report Status 03/11/2012 FINAL   Final   CULTURE, RESPIRATORY     Status: Normal (Preliminary result)   Collection Time   03/11/12  6:47 PM      Component Value Range Status Comment   Specimen Description SPUTUM   Final    Special Requests NONE   Final    Gram Stain     Final    Value: FEW WBC PRESENT,BOTH PMN AND MONONUCLEAR     MODERATE SQUAMOUS EPITHELIAL CELLS PRESENT     NO ORGANISMS SEEN   Culture FEW CANDIDA ALBICANS   Final    Report Status PENDING   Incomplete      Labs: Basic Metabolic Panel:  Lab 92/01/00 0527 03/11/12 0410 03/10/12 1355  NA 130* 139 135  K 3.9 3.8 4.4  CL 89* 98 100  CO2 _0 GLUCOSE 205* 131* 75  BUN 28* 21 20  CREATININE 1.46* 1.20 1.07  CALCIUM 9.4 9.0 9.2  MG -- -- --  PHOS -- -- --  Liver Function Tests: No results found for this basename: AST:5,ALT:5,ALKPHOS:5,BILITOT:5,PROT:5,ALBUMIN:5 in the last 168 hours No results found for this basename: LIPASE:5,AMYLASE:5 in the last 168 hours No results found for this basename: AMMONIA:5 in the last 168 hours CBC:  Lab 03/11/12 1610 03/11/12 0410 03/10/12 1355  WBC 6.5 5.5 6.0  NEUTROABS -- -- 3.6  HGB 15.3 14.6 14.9  HCT 44.4 43.5 43.8  MCV 90.6 92.2 91.4  PLT 110* 99* 95*   Cardiac Enzymes: No results found for this basename: CKTOTAL:5,CKMB:5,CKMBINDEX:5,TROPONINI:5 in the last 168 hours BNP: BNP (last 3 results)  Basename 03/10/12 1400  PROBNP 492.2*   CBG: No results found for this basename: GLUCAP:5 in the last 168 hours  Time coordinating discharge: 40  minutes  Signed:  Dekendrick Uzelac  [facility] 03/13/2012, 2:24 PM

## 2012-03-13 NOTE — Progress Notes (Signed)
03-13-12 0515  NSG:  Pt IV began leaking 2/3rds of the way thru his last Vancomycin.  Pt states that the doctor said he was going home this morning and does not want the IV restarted.

## 2012-03-14 LAB — CULTURE, RESPIRATORY W GRAM STAIN

## 2012-03-15 NOTE — Progress Notes (Signed)
Discharge summary sent to payer through [facility]  

## 2012-03-16 LAB — CULTURE, BLOOD (ROUTINE X 2): Culture: NO GROWTH

## 2012-03-29 ENCOUNTER — Ambulatory Visit (INDEPENDENT_AMBULATORY_CARE_PROVIDER_SITE_OTHER): Payer: Medicare Other | Admitting: Pulmonary Disease

## 2012-03-29 ENCOUNTER — Encounter: Payer: Self-pay | Admitting: Pulmonary Disease

## 2012-03-29 VITALS — BP 146/86 | HR 85 | Temp 98.1°F | Ht 67.0 in | Wt 131.0 lb

## 2012-03-29 DIAGNOSIS — I5033 Acute on chronic diastolic (congestive) heart failure: Secondary | ICD-10-CM

## 2012-03-29 DIAGNOSIS — J449 Chronic obstructive pulmonary disease, unspecified: Secondary | ICD-10-CM

## 2012-03-29 DIAGNOSIS — R0902 Hypoxemia: Secondary | ICD-10-CM

## 2012-03-29 DIAGNOSIS — I509 Heart failure, unspecified: Secondary | ICD-10-CM

## 2012-03-29 DIAGNOSIS — J159 Unspecified bacterial pneumonia: Secondary | ICD-10-CM

## 2012-03-29 DIAGNOSIS — I2510 Atherosclerotic heart disease of native coronary artery without angina pectoris: Secondary | ICD-10-CM

## 2012-03-29 DIAGNOSIS — Z23 Encounter for immunization: Secondary | ICD-10-CM

## 2012-03-29 MED ORDER — ALBUTEROL SULFATE HFA 108 (90 BASE) MCG/ACT IN AERS: 2.0000 | INHALATION_SPRAY | Freq: Four times a day (QID) | RESPIRATORY_TRACT | Status: DC | PRN

## 2012-03-29 MED ORDER — TIOTROPIUM BROMIDE MONOHYDRATE 18 MCG IN CAPS: 18.0000 ug | ORAL_CAPSULE | Freq: Every day | RESPIRATORY_TRACT | Status: DC

## 2012-03-29 NOTE — Patient Instructions (Addendum)
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

## 2012-03-29 NOTE — Progress Notes (Signed)
Subjective:    Patient ID: Luke Jacobson, male    DOB: September 07, 1940, 71 y.o.   MRN: [id number]  HPI  PCP - [facility] - [facility] , felker, transplant  71 y.o. 22 PY ex smoker (quit '95)  presents for post hospital FU. PMH ischemic cardiomyopathy, status post heart transplant in 1996, on immunosuppressants, followed at [facility], hypertension, hyperlipidemia, heparin-induced thrombocytopenia He had symptoms of progressive SOB for several weeks to months.  He was noted to have a left lingular infiltrate on chest x-ray from 8/27 in the ED but signed out AMA however returned 2 days back with persistent dyspnea and admitted 8/29 to 03/13/12 treated as HCAP (due to recent adm at [facility] for echo/cath) with IV Vanco, cefepime and Levaquin (day 3)  - sputum culture and urine Legionella negative , BNP 492 His repeat chest x-ray  showed some improvement in pneumonia CT angio chest showed Emphysema with interstitial thickening, most compatible with  interstitial pulmonary edema. Small right and trace left pleural effusion. pulm HTN & 4.1 cm ascending aortic aneurysm below site of anastomosis. -Improved with nebs & short course of po prednisone.He was discharged on albuterol puffs, advair and spiriva but did not fill Rx. -his o2 sats are 92 on RA but drops to 87 on ambulation. Recommended for o2 via [place] on ambulation on discharge but patient refused and wants to see his PCP and pulmonologist before deciding on home o2.  Dyspnea is improved, reports pedal edema He had PFTs done at [place] a year ago - was told that his 'lungs are not emptying fully'. Spirometry showed moderate airway obstruction with fev 1 of 46%, FVC 68%, ratio 50 He does deaturate to 87% on walking but recovers at rest   Past Medical History  Diagnosis Date  . Hypertension   . Shortness of breath   . Hypercholesteremia   . 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 COPD (chronic obstructive pulmonary disease)     Past Surgical History    Procedure Date  . Heart transplant     Allergies  Allergen Reactions  . Heparin     Made white blood count go down. HIT    History   Social History  . Marital Status: Single    Spouse Name: N/A    Number of Children: N/A  . Years of Education: N/A   Occupational History  . 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 Smoking status: Former Smoker -- 1.0 packs/day for 30 years    Types: Cigarettes    Quit date: 03/10/1994  . Smokeless tobacco: Never Used  . Alcohol Use: Yes     social  . Drug Use: No  . Sexually Active: Not on file   Other Topics Concern  . Not on file   Social History Narrative  . No narrative on file     Review of Systems  Constitutional: Positive for appetite change and unexpected weight change. Negative for fever.  HENT: Positive for congestion, rhinorrhea, dental problem and postnasal drip. Negative for ear pain, nosebleeds, sore throat, sneezing, trouble swallowing and sinus pressure.   Eyes: Negative for redness and itching.  Respiratory: Positive for cough, shortness of breath and wheezing. Negative for chest tightness.   Cardiovascular: Positive for leg swelling. Negative for palpitations.  Gastrointestinal: Negative for nausea and vomiting.  Genitourinary: Negative for dysuria.  Musculoskeletal: Negative for joint swelling.  Skin: Negative for rash.  Neurological: Negative for headaches.  Hematological: Bruises/bleeds easily.  Psychiatric/Behavioral: Positive for dysphoric mood. The patient is nervous/anxious.        Objective:   Physical Exam  Gen. Pleasant, well-nourished, in no distress, normal affect ENT - no lesions, no post nasal drip Neck: No JVD, no thyromegaly, no carotid bruits Lungs: no use of accessory muscles, no dullness to percussion, decreased without rales or rhonchi  Cardiovascular: midline sternotomy scar, Rhythm regular, heart sounds  normal, no murmurs or gallops,  no peripheral edema Abdomen: soft and non-tender, no hepatosplenomegaly, BS normal. Musculoskeletal: No deformities, no cyanosis or clubbing Neuro:  alert, non focal       Assessment & Plan:

## 2012-03-30 ENCOUNTER — Telehealth: Payer: Self-pay | Admitting: Pulmonary Disease

## 2012-03-30 NOTE — Assessment & Plan Note (Signed)
Although treated as such, not impressed on retrospect In fact, CT did not show definite infiltrate & was more c/w CHF, even though BNP low

## 2012-03-30 NOTE — Assessment & Plan Note (Signed)
aneurysm of your scending aorta 4.1 cm - he will discuss this with transplant team Will need fu imaging in a few months

## 2012-03-30 NOTE — Assessment & Plan Note (Signed)
CT did not show definite infiltrate & was more c/w CHF, even though BNP low Start lasix daily until pedal edema improved

## 2012-03-30 NOTE — Assessment & Plan Note (Signed)
Reassess on next visit after diuresis He will qualify for oxygen if desaturation persists

## 2012-03-30 NOTE — Assessment & Plan Note (Addendum)
Spirometry shows  (fev1) at 46% -you have moderate COPD Trial of spiriva daily -sample -call me in 10 days for Rx if this works Albuterol (pro-air) Rx 2 puffs every 6h as needed only Flu shot Consider pulm rehab in the future

## 2012-03-30 NOTE — Telephone Encounter (Signed)
Dr. Elsworth Soho did you request this info? [place] Bing, CMA

## 2012-03-31 NOTE — Telephone Encounter (Signed)
mindy pl fax note to this no

## 2012-03-31 NOTE — Telephone Encounter (Signed)
I had already done so yesterday afternoon to the # listed above to Dr. Katy Apo. Will fax note also over to [facility].

## 2012-04-05 ENCOUNTER — Telehealth: Payer: Self-pay | Admitting: Pulmonary Disease

## 2012-04-05 NOTE — Telephone Encounter (Signed)
I spoke with the pt and he states that he has been taking spiriva in the evening and proair in the mornings and his heart rate has been in the 160's. I advised the pt to take his spiriva in the morning and that the proair is to be used only as needed. I advised the pt to hold off on the albuterol until we get a message to Dr. Elsworth Soho, but to continue his spiriva daily. Pt states understanding. Dr. Elsworth Soho please advise on increased HR? Thanks. [place] Bing, CMA

## 2012-04-06 MED ORDER — TIOTROPIUM BROMIDE MONOHYDRATE 18 MCG IN CAPS: 18.0000 ug | ORAL_CAPSULE | Freq: Every day | RESPIRATORY_TRACT | Status: DC

## 2012-04-06 NOTE — Telephone Encounter (Signed)
Spoke with patient-aware that I have sent Rx for Spiriva and will forward message to RA to let him know that pt will not use Proair HFA until seen on 04/26/2012; RA please advise if you would like to give patient Xopenex HFA in the meantime. Thanks.   Pt aware that RA back in office Thursday 04-07-12.

## 2012-04-06 NOTE — Telephone Encounter (Signed)
He needs to see his cardiologist ASAp. If unable, pl have him see TP in am

## 2012-04-06 NOTE — Telephone Encounter (Signed)
Pro air can cause heart rate to go up - not to use more than 2 puffs every 6h only if wheezing, otherwise avoid

## 2012-04-06 NOTE — Telephone Encounter (Signed)
Pt called back and stated he spoke to his cardiologist and that dr advised that the proair was prob the cause of his fast heart rate. Also needs Sprivia called into [facility] on [address].  Call pt back @ [place]

## 2012-04-06 NOTE — Telephone Encounter (Addendum)
Called, spoke with pt.  I informed him of below per Dr. Elsworth Soho.  Pt states he has not used proair since speaking with [facility] yesterday.  The increased HR has now resolved.  It lasted approx 3-4 hours after taking the proair.  HR current 95-98.  States he used spiriva last night with no problem.  Pt states he does see Dr. Stann Mainland or Dr. Katy Apo at [facility] and will call their office today to inform them of situation and see if they want him to come in for OV.  Offered OV with TP -- pt will call cards at [facility] for now.  Advised to stay off proair for now and will send msg back to Dr. Elsworth Soho to advise on further recs for this.  He verbalized understanding.  Dr. Elsworth Soho, pls advise.  Thank you.

## 2012-04-07 NOTE — Telephone Encounter (Signed)
Pt is aware of RA recs. Only to use proair if wheezing and no more than 2 puffs q6hrs prn. Nothing further was needed

## 2012-04-26 ENCOUNTER — Encounter: Payer: Self-pay | Admitting: Pulmonary Disease

## 2012-04-26 ENCOUNTER — Ambulatory Visit (INDEPENDENT_AMBULATORY_CARE_PROVIDER_SITE_OTHER): Payer: Medicare Other | Admitting: Pulmonary Disease

## 2012-04-26 VITALS — BP 116/74 | HR 90 | Temp 97.7°F | Ht 67.0 in | Wt 131.0 lb

## 2012-04-26 DIAGNOSIS — J449 Chronic obstructive pulmonary disease, unspecified: Secondary | ICD-10-CM

## 2012-04-26 NOTE — Assessment & Plan Note (Addendum)
Check oxygen level during sleep - offered him portable O2 based on desatn on walking but he wishes to hold off Pulmonary rehab referral Stay on spiriva, can add symbicort to this regimen but concern that LABA will also cause tachycardia Take mucinex in am Use pro air only for emergency

## 2012-04-26 NOTE — Progress Notes (Signed)
  Subjective:    Patient ID: Luke Jacobson, male    DOB: 11-10-1940, 71 y.o.   MRN: [id number]  HPI PCP - [facility]  Cards - [facility] , felker, transplant   71 y.o. 44 PY ex smoker (quit '95) presents for post hospital FU.  PMH ischemic cardiomyopathy, status post heart transplant in 1996, on immunosuppressants, followed at [facility], hypertension, hyperlipidemia, heparin-induced thrombocytopenia  He had symptoms of progressive SOB for several weeks to months.  He was noted to have a left lingular infiltrate on chest x-ray from 8/27 in the ED but signed out AMA however returned 2 days back with persistent dyspnea and admitted 8/29 to 03/13/12 treated as HCAP (due to recent adm at [facility] for echo/cath) with IV Vanco, cefepime and Levaquin (day 3)  - sputum culture and urine Legionella negative , BNP 492  His repeat chest x-ray showed some improvement in pneumonia  CT angio chest showed Emphysema with interstitial thickening, most compatible with  interstitial pulmonary edema. Small right and trace left pleural effusion. pulm HTN & 4.1 cm ascending aortic aneurysm below site of anastomosis.  -Improved with nebs & short course of po prednisone.He was discharged on albuterol puffs, advair and spiriva but did not fill Rx.  -his o2 sats are 92 on RA but drops to 87 on ambulation. Recommended for o2 via [place] on ambulation on discharge but patient refused and wants to see his PCP and pulmonologist before deciding on home o2.  Dyspnea is improved, reports pedal edema  He had PFTs done at [place] a year ago - was told that his 'lungs are not emptying fully'.  Spirometry showed moderate airway obstruction with fev 1 of 46%, FVC 68%, ratio 50  He does deaturate to 87% on walking but recovers at rest   04/26/2012 Spiriva helping somewhat Called back for HR 160's - stopped pro air Breathing unchanged - has DOE, wheezing - worse in the am, prod cough with whitish to light brown mucus, and PND.   Reviewed records from [place] -  via care everywhere, they are aware of his aortic aneurysm Continues to desatn on walking   Review of Systems neg for any significant sore throat, dysphagia, itching, sneezing, nasal congestion or excess/ purulent secretions, fever, chills, sweats, unintended wt loss, pleuritic or exertional cp, hempoptysis, orthopnea pnd or change in chronic leg swelling. Also denies presyncope, palpitations, heartburn, abdominal pain, nausea, vomiting, diarrhea or change in bowel or urinary habits, dysuria,hematuria, rash, arthralgias, visual complaints, headache, numbness weakness or ataxia.     Objective:   Physical Exam  Gen. Pleasant, well-nourished, in no distress ENT - no lesions, no post nasal drip Neck: No JVD, no thyromegaly, no carotid bruits Lungs: no use of accessory muscles, no dullness to percussion, clear without rales or rhonchi  Cardiovascular: Rhythm regular, heart sounds  normal, no murmurs or gallops, no peripheral edema Musculoskeletal: No deformities, no cyanosis or clubbing         Assessment & Plan:

## 2012-04-26 NOTE — Patient Instructions (Addendum)
Check oxygen level during sleep Pulmonary rehab referral Stay on spiriva Take mucinex in am Use pro air only for emergency

## 2012-05-01 ENCOUNTER — Telehealth: Payer: Self-pay | Admitting: Pulmonary Disease

## 2012-05-01 DIAGNOSIS — G4734 Idiopathic sleep related nonobstructive alveolar hypoventilation: Secondary | ICD-10-CM

## 2012-05-01 NOTE — Telephone Encounter (Signed)
He does have significant desaturations during sleep Will benefit from 2 L O2 use during sleep OK to send order to DME - also rechk ONO on 2L

## 2012-05-02 NOTE — Telephone Encounter (Signed)
02 2lpm hs ordered through [facility]

## 2012-05-02 NOTE — Telephone Encounter (Signed)
I spoke with patient about results and he verbalized understanding and had no questions. He agree'd to be set up on O2 at night. i have sent order to pcc's. Please advise thanks

## 2012-05-05 ENCOUNTER — Other Ambulatory Visit: Payer: Self-pay | Admitting: Pulmonary Disease

## 2012-05-10 ENCOUNTER — Telehealth: Payer: Self-pay | Admitting: Pulmonary Disease

## 2012-05-10 NOTE — Telephone Encounter (Signed)
lmomtcb x1-- results are in Dr. Bari Mantis look at folder for when he returns to the office next week

## 2012-05-11 NOTE — Telephone Encounter (Signed)
All other medications for COPD can affect his HR So, stay on spiriva for now

## 2012-05-11 NOTE — Telephone Encounter (Signed)
I spoke with pt and is aware of RA response. He voiced his understanding and will stay on spiriva. He is also aware will call with results once RA reviews his results. He voiced his understanding and needed nothing further

## 2012-05-11 NOTE — Telephone Encounter (Signed)
Pt aware RA will review results when back in the office next week. The pt also wanted to let RA know that he does not think the Spiriva is helping him and wants to know if he should continue this or can he stop the medication. I instructed the pt to continue using the Spiriva until he hears from RA. Pt verbalized understanding.

## 2012-05-17 ENCOUNTER — Telehealth: Payer: Self-pay | Admitting: Pulmonary Disease

## 2012-05-17 ENCOUNTER — Encounter: Payer: Self-pay | Admitting: Pulmonary Disease

## 2012-05-17 DIAGNOSIS — R0902 Hypoxemia: Secondary | ICD-10-CM

## 2012-05-17 NOTE — Telephone Encounter (Signed)
I spoke with pt and is aware of results. He will increase oxygen to 2.5 liters. i have sent order to [facility] for this and to repeat ONO. Pt aware to stop spiriva as well. He wanted to know if Dr.A lva ever received his test results he had done 2 years ago from [place]. Per pt Dr. Elsworth Soho was going to compare this with his studies. He stated it was the same oxygen test we done on him. Please advise Dr.A lva thanks

## 2012-05-17 NOTE — Telephone Encounter (Signed)
Goal is for satn to be above 88% - so OK to not use o2 daytime ONO on 2L still shows significant desatn 5.5h   Increase to 2.5 L & rechk ONO at this level OK to stop spiriva

## 2012-05-17 NOTE — Telephone Encounter (Signed)
Results are in Dr. Bari Mantis look at folder. Pt aware will call once RA reviews results. Also pt wanted to make Dr. Elsworth Soho aware that he feels worse when he uses the spiriva. He stated he feels better when he wasn't on this. Also he stated he bought a pulse ox meter and monitoring his oxygen sats. During the day it ranges 92-93% RA and then in the mornings when he wakes up it is about 88-89% 2 liters oxygen. Please advise Dr. Elsworth Soho thanks

## 2012-05-26 ENCOUNTER — Telehealth: Payer: Self-pay | Admitting: Pulmonary Disease

## 2012-05-26 NOTE — Telephone Encounter (Signed)
ONO on 2.5 L O2 shows desatn for 3h 45 m (46% of night)  - Increase to 3L O2

## 2012-05-27 NOTE — Telephone Encounter (Signed)
ATC the pt, NA and no option to leave a msg, [facility]

## 2012-06-01 NOTE — Telephone Encounter (Signed)
Pt returned call. Asks that nurse please call back soon. Mariann Laster

## 2012-06-01 NOTE — Telephone Encounter (Signed)
lmomtcb x1

## 2012-06-02 NOTE — Telephone Encounter (Signed)
I spoke with patient about results and he verbalized understanding and had no questions 

## 2012-06-03 NOTE — Telephone Encounter (Signed)
No, did not get this - I doubt this will change what we are dealing with now, but pl try to obtain if possible

## 2012-06-06 ENCOUNTER — Encounter: Payer: Self-pay | Admitting: Pulmonary Disease

## 2012-06-30 ENCOUNTER — Telehealth (HOSPITAL_COMMUNITY): Payer: Self-pay | Admitting: *Deleted

## 2012-06-30 NOTE — Telephone Encounter (Signed)
Called place to patient regarding Referral to Pulmonary Rehab. No answer , letter requesting patient to call us.   Leverne Humbles RN

## 2012-07-20 ENCOUNTER — Encounter (HOSPITAL_COMMUNITY): Payer: Self-pay

## 2012-07-20 ENCOUNTER — Encounter (HOSPITAL_COMMUNITY)
Admission: RE | Admit: 2012-07-20 | Discharge: 2012-07-20 | Disposition: A | Discharge status: Home / Self Care | Payer: Medicare Other | Source: Ambulatory Visit | Attending: Pulmonary Disease | Admitting: Pulmonary Disease

## 2012-07-20 DIAGNOSIS — I5033 Acute on chronic diastolic (congestive) heart failure: Secondary | ICD-10-CM | POA: Insufficient documentation

## 2012-07-20 DIAGNOSIS — J159 Unspecified bacterial pneumonia: Secondary | ICD-10-CM | POA: Insufficient documentation

## 2012-07-20 DIAGNOSIS — I10 Essential (primary) hypertension: Secondary | ICD-10-CM | POA: Insufficient documentation

## 2012-07-20 DIAGNOSIS — Z5189 Encounter for other specified aftercare: Secondary | ICD-10-CM | POA: Insufficient documentation

## 2012-07-20 DIAGNOSIS — J9600 Acute respiratory failure, unspecified whether with hypoxia or hypercapnia: Secondary | ICD-10-CM | POA: Insufficient documentation

## 2012-07-20 DIAGNOSIS — J441 Chronic obstructive pulmonary disease with (acute) exacerbation: Secondary | ICD-10-CM | POA: Insufficient documentation

## 2012-07-20 HISTORY — DX: Abdominal aortic aneurysm, without rupture: I71.4

## 2012-07-20 HISTORY — DX: Cardiac arrhythmia, unspecified: I49.9

## 2012-07-20 HISTORY — DX: Abdominal aortic aneurysm, without rupture, unspecified: I71.40

## 2012-07-20 NOTE — Progress Notes (Signed)
Luke Jacobson came today for orientation to Pulmonary Rehab.  Health history and medications reviewed with patient.  10 minutes spent in discussion of oxygen therapy.   Nurse assessment done.  He has been reluctant to use oxygen for more strenuous exercise.  We will monitor levels and encourage and support.  Goals set. Safety and hand hygiene in the exercise area reviewed with patient.  Patient voices understanding.Demonstration and practice of PLB using pulse oximeter.  Patient able to return demonstration satisfactorily.   Goals for pulmonary rehab set.  6 min walk test to be done on first day of exercise.  He plans to start exercise tomorrow.  We look forward to helping this nice gentleman.   Leverne Humbles RN

## 2012-07-21 ENCOUNTER — Encounter (HOSPITAL_COMMUNITY)
Admission: RE | Admit: 2012-07-21 | Discharge: 2012-07-21 | Disposition: A | Discharge status: Home / Self Care | Payer: Medicare Other | Source: Ambulatory Visit | Attending: Pulmonary Disease | Admitting: Pulmonary Disease

## 2012-07-26 ENCOUNTER — Encounter (HOSPITAL_COMMUNITY)
Admission: RE | Admit: 2012-07-26 | Discharge: 2012-07-26 | Disposition: A | Discharge status: Home / Self Care | Payer: Medicare Other | Source: Ambulatory Visit | Attending: Pulmonary Disease | Admitting: Pulmonary Disease

## 2012-07-28 ENCOUNTER — Encounter (HOSPITAL_COMMUNITY)
Admission: RE | Admit: 2012-07-28 | Discharge: 2012-07-28 | Disposition: A | Discharge status: Home / Self Care | Payer: Medicare Other | Source: Ambulatory Visit | Attending: Pulmonary Disease | Admitting: Pulmonary Disease

## 2012-08-02 ENCOUNTER — Encounter (HOSPITAL_COMMUNITY)
Admission: RE | Admit: 2012-08-02 | Discharge: 2012-08-02 | Disposition: A | Discharge status: Home / Self Care | Payer: Medicare Other | Source: Ambulatory Visit | Attending: Pulmonary Disease | Admitting: Pulmonary Disease

## 2012-08-04 ENCOUNTER — Encounter (HOSPITAL_COMMUNITY)
Admission: RE | Admit: 2012-08-04 | Discharge: 2012-08-04 | Disposition: A | Discharge status: Home / Self Care | Payer: Medicare Other | Source: Ambulatory Visit | Attending: Pulmonary Disease | Admitting: Pulmonary Disease

## 2012-08-09 ENCOUNTER — Encounter (HOSPITAL_COMMUNITY)
Admission: RE | Admit: 2012-08-09 | Discharge: 2012-08-09 | Disposition: A | Discharge status: Home / Self Care | Payer: Medicare Other | Source: Ambulatory Visit | Attending: Pulmonary Disease | Admitting: Pulmonary Disease

## 2012-08-09 NOTE — Progress Notes (Signed)
Brief Nutrition Note Spoke with pt. Pt c/o 10-20 lb wt loss over the past year and decreased appetite. Per discussion, pt reports he is dealing with his own illness and his mother is having some dental health issues. Pt asked if he was depressed and pt stated "yes, but I'm a walking pharmacy and I don't want to take anything else." Pt encouraged to consider talking with his MD re: Remeron, which may help with depression and wt gain. Pt stated he would discuss starting a medication for depression/appetite with his MD at [facility] in 2 weeks. High Calorie, High Protein diet and recipes discussed and handouts given. Continue client-centered nutrition education by RD as part of interdisciplinary care.  Monitor and evaluate progress toward nutrition goal with team.

## 2012-08-11 ENCOUNTER — Encounter (HOSPITAL_COMMUNITY)
Admission: RE | Admit: 2012-08-11 | Discharge: 2012-08-11 | Disposition: A | Discharge status: Home / Self Care | Payer: Medicare Other | Source: Ambulatory Visit | Attending: Pulmonary Disease | Admitting: Pulmonary Disease

## 2012-08-16 ENCOUNTER — Encounter (HOSPITAL_COMMUNITY)
Admission: RE | Admit: 2012-08-16 | Discharge: 2012-08-16 | Disposition: A | Discharge status: Home / Self Care | Payer: Medicare Other | Source: Ambulatory Visit | Attending: Pulmonary Disease | Admitting: Pulmonary Disease

## 2012-08-16 DIAGNOSIS — J9600 Acute respiratory failure, unspecified whether with hypoxia or hypercapnia: Secondary | ICD-10-CM | POA: Insufficient documentation

## 2012-08-16 DIAGNOSIS — I10 Essential (primary) hypertension: Secondary | ICD-10-CM | POA: Insufficient documentation

## 2012-08-16 DIAGNOSIS — I5033 Acute on chronic diastolic (congestive) heart failure: Secondary | ICD-10-CM | POA: Insufficient documentation

## 2012-08-16 DIAGNOSIS — J441 Chronic obstructive pulmonary disease with (acute) exacerbation: Secondary | ICD-10-CM | POA: Insufficient documentation

## 2012-08-16 DIAGNOSIS — Z5189 Encounter for other specified aftercare: Secondary | ICD-10-CM | POA: Insufficient documentation

## 2012-08-16 DIAGNOSIS — J159 Unspecified bacterial pneumonia: Secondary | ICD-10-CM | POA: Insufficient documentation

## 2012-08-18 ENCOUNTER — Encounter (HOSPITAL_COMMUNITY)
Admission: RE | Admit: 2012-08-18 | Discharge: 2012-08-18 | Disposition: A | Discharge status: Home / Self Care | Payer: Medicare Other | Source: Ambulatory Visit | Attending: Pulmonary Disease | Admitting: Pulmonary Disease

## 2012-08-23 ENCOUNTER — Encounter (HOSPITAL_COMMUNITY)
Admission: RE | Admit: 2012-08-23 | Discharge: 2012-08-23 | Disposition: A | Discharge status: Home / Self Care | Payer: Medicare Other | Source: Ambulatory Visit | Attending: Pulmonary Disease | Admitting: Pulmonary Disease

## 2012-08-23 ENCOUNTER — Encounter: Payer: Self-pay | Admitting: Pulmonary Disease

## 2012-08-25 ENCOUNTER — Encounter (HOSPITAL_COMMUNITY): Payer: Medicare Other

## 2012-08-30 ENCOUNTER — Encounter (HOSPITAL_COMMUNITY)
Admission: RE | Admit: 2012-08-30 | Discharge: 2012-08-30 | Disposition: A | Discharge status: Home / Self Care | Payer: Medicare Other | Source: Ambulatory Visit | Attending: Pulmonary Disease | Admitting: Pulmonary Disease

## 2012-09-01 ENCOUNTER — Encounter (HOSPITAL_COMMUNITY)
Admission: RE | Admit: 2012-09-01 | Discharge: 2012-09-01 | Disposition: A | Discharge status: Home / Self Care | Payer: Medicare Other | Source: Ambulatory Visit | Attending: Pulmonary Disease | Admitting: Pulmonary Disease

## 2012-09-01 NOTE — Progress Notes (Signed)
Luke Jacobson 72 y.o. male Nutrition Note Spoke with pt. Pt is at a normal wt. Per discussion with pt, pt's UBW was 165 lbs 1-1.5 yrs ago. Pt wt today 61.5 kg (135.3#), which is down 34.7# from pt's UBW. Pt has gained a desirable 1.5 kg since starting Pulmonary Rehab 5 weeks ago. Pt states he spoke with his cardiologist at [facility] re: Remeron to help with depression and appetite. Per pt, his Cardiologist did not order Remeron. Pt to ask his PCP re: Remeron Rx "tonight when I see him." Pt is trying to follow a high calorie, high protein diet. Pt encouraged to consider adding whey protein to food to increase protein intake. Pt consumes either hot or cold cereal at breakfast and plans to add a scoop of whey protein to cereal daily. Pt currently drinking a Boost or Boost High Protein either as a supplement or meal replacement. Pt encouraged to change to Boost Plus. Pt's Rate Your Plate results reviewed with pt.  Pt expressed understanding.  Pt avoids some salty food. Pt uses mostly fresh/frozen vegetables, eats out 2 times weekly, and uses frozen dinners "a couple times a week if my wife has to work late." Pt adds salt to food occasionally. The role of sodium in lung disease reviewed with pt.  Nutrition Diagnosis   Excessive sodium intake related to over consumption of processed food as evidenced by frequent consumption of convenience foodand eating out frequently.   Food-and nutrition-related knowledge deficit related to lack of exposure to information as related to diagnosis of pulmonary disease   Increased energy expenditure related to increased energy requirements and recent h/o wt loss. Nutrition Rx/Est. Daily Nutrition Needs for: ? wt gain 2500-3000 Kcal 90-120 gm protein   1500 mg or less sodium      Nutrition Intervention   Pt's individual nutrition plan and goals reviewed with pt.   Benefits of adopting healthy eating habits discussed when pt's Rate Your Plate reviewed.   Pt to attend the Nutrition  and Lung Disease class   Continual client-centered nutrition education by RD, as part of interdisciplinary care. Goal(s) 1. Pt to identify and limit food sources of sodium. 2. The pt will recognize symptoms that can interfere with adequate oral intake, such as shortness of breath, N/V, early satiety, fatigue, ability to secure and prepare food, taste and smell changes, chewing/swallowing difficulties, and/ or pain when eating. Identify food quantities necessary to achieve wt gain of  -2# per week Monitor and Evaluate progress toward nutrition goal with team.

## 2012-09-06 ENCOUNTER — Encounter (HOSPITAL_COMMUNITY)
Admission: RE | Admit: 2012-09-06 | Discharge: 2012-09-06 | Disposition: A | Discharge status: Home / Self Care | Payer: Medicare Other | Source: Ambulatory Visit | Attending: Pulmonary Disease | Admitting: Pulmonary Disease

## 2012-09-08 ENCOUNTER — Encounter (HOSPITAL_COMMUNITY)
Admission: RE | Admit: 2012-09-08 | Discharge: 2012-09-08 | Disposition: A | Discharge status: Home / Self Care | Payer: Medicare Other | Source: Ambulatory Visit | Attending: Pulmonary Disease | Admitting: Pulmonary Disease

## 2012-09-13 ENCOUNTER — Encounter (HOSPITAL_COMMUNITY): Payer: Medicare Other

## 2012-09-15 ENCOUNTER — Encounter (HOSPITAL_COMMUNITY)
Admission: RE | Admit: 2012-09-15 | Discharge: 2012-09-15 | Disposition: A | Discharge status: Home / Self Care | Payer: Medicare Other | Source: Ambulatory Visit | Attending: Pulmonary Disease | Admitting: Pulmonary Disease

## 2012-09-15 DIAGNOSIS — I5032 Chronic diastolic (congestive) heart failure: Secondary | ICD-10-CM | POA: Insufficient documentation

## 2012-09-15 DIAGNOSIS — J159 Unspecified bacterial pneumonia: Secondary | ICD-10-CM | POA: Insufficient documentation

## 2012-09-15 DIAGNOSIS — I5033 Acute on chronic diastolic (congestive) heart failure: Secondary | ICD-10-CM | POA: Insufficient documentation

## 2012-09-15 DIAGNOSIS — J4489 Other specified chronic obstructive pulmonary disease: Secondary | ICD-10-CM | POA: Insufficient documentation

## 2012-09-15 DIAGNOSIS — J449 Chronic obstructive pulmonary disease, unspecified: Secondary | ICD-10-CM | POA: Insufficient documentation

## 2012-09-15 DIAGNOSIS — I509 Heart failure, unspecified: Secondary | ICD-10-CM | POA: Insufficient documentation

## 2012-09-15 DIAGNOSIS — J9600 Acute respiratory failure, unspecified whether with hypoxia or hypercapnia: Secondary | ICD-10-CM | POA: Insufficient documentation

## 2012-09-15 DIAGNOSIS — Z5189 Encounter for other specified aftercare: Secondary | ICD-10-CM | POA: Insufficient documentation

## 2012-09-15 DIAGNOSIS — I10 Essential (primary) hypertension: Secondary | ICD-10-CM | POA: Insufficient documentation

## 2012-09-20 ENCOUNTER — Encounter (HOSPITAL_COMMUNITY)
Admission: RE | Admit: 2012-09-20 | Discharge: 2012-09-20 | Disposition: A | Discharge status: Home / Self Care | Payer: Medicare Other | Source: Ambulatory Visit | Attending: Pulmonary Disease | Admitting: Pulmonary Disease

## 2012-09-22 ENCOUNTER — Encounter (HOSPITAL_COMMUNITY)
Admission: RE | Admit: 2012-09-22 | Discharge: 2012-09-22 | Disposition: A | Discharge status: Home / Self Care | Payer: Medicare Other | Source: Ambulatory Visit | Attending: Pulmonary Disease | Admitting: Pulmonary Disease

## 2012-09-27 ENCOUNTER — Encounter (HOSPITAL_COMMUNITY): Payer: Medicare Other

## 2012-09-29 ENCOUNTER — Encounter (HOSPITAL_COMMUNITY)
Admission: RE | Admit: 2012-09-29 | Discharge: 2012-09-29 | Disposition: A | Discharge status: Home / Self Care | Payer: Medicare Other | Source: Ambulatory Visit | Attending: Pulmonary Disease | Admitting: Pulmonary Disease

## 2012-10-04 ENCOUNTER — Encounter (HOSPITAL_COMMUNITY)
Admission: RE | Admit: 2012-10-04 | Discharge: 2012-10-04 | Disposition: A | Discharge status: Home / Self Care | Payer: Medicare Other | Source: Ambulatory Visit | Attending: Pulmonary Disease | Admitting: Pulmonary Disease

## 2012-10-06 ENCOUNTER — Encounter (HOSPITAL_COMMUNITY)
Admission: RE | Admit: 2012-10-06 | Discharge: 2012-10-06 | Disposition: A | Discharge status: Home / Self Care | Payer: Medicare Other | Source: Ambulatory Visit | Attending: Pulmonary Disease | Admitting: Pulmonary Disease

## 2012-10-11 ENCOUNTER — Encounter (HOSPITAL_COMMUNITY)
Admission: RE | Admit: 2012-10-11 | Discharge: 2012-10-11 | Disposition: A | Discharge status: Home / Self Care | Payer: Medicare Other | Source: Ambulatory Visit | Attending: Pulmonary Disease | Admitting: Pulmonary Disease

## 2012-10-11 DIAGNOSIS — J4489 Other specified chronic obstructive pulmonary disease: Secondary | ICD-10-CM | POA: Insufficient documentation

## 2012-10-11 DIAGNOSIS — J441 Chronic obstructive pulmonary disease with (acute) exacerbation: Secondary | ICD-10-CM | POA: Insufficient documentation

## 2012-10-11 DIAGNOSIS — R0902 Hypoxemia: Secondary | ICD-10-CM | POA: Insufficient documentation

## 2012-10-11 DIAGNOSIS — J449 Chronic obstructive pulmonary disease, unspecified: Secondary | ICD-10-CM | POA: Insufficient documentation

## 2012-10-11 DIAGNOSIS — I10 Essential (primary) hypertension: Secondary | ICD-10-CM | POA: Insufficient documentation

## 2012-10-11 DIAGNOSIS — J9600 Acute respiratory failure, unspecified whether with hypoxia or hypercapnia: Secondary | ICD-10-CM | POA: Insufficient documentation

## 2012-10-11 DIAGNOSIS — J159 Unspecified bacterial pneumonia: Secondary | ICD-10-CM | POA: Insufficient documentation

## 2012-10-11 DIAGNOSIS — Z5189 Encounter for other specified aftercare: Secondary | ICD-10-CM | POA: Insufficient documentation

## 2012-10-11 DIAGNOSIS — Z941 Heart transplant status: Secondary | ICD-10-CM | POA: Insufficient documentation

## 2012-10-11 DIAGNOSIS — I5033 Acute on chronic diastolic (congestive) heart failure: Secondary | ICD-10-CM | POA: Insufficient documentation

## 2012-10-13 ENCOUNTER — Encounter (HOSPITAL_COMMUNITY)
Admission: RE | Admit: 2012-10-13 | Discharge: 2012-10-13 | Disposition: A | Discharge status: Home / Self Care | Payer: Medicare Other | Source: Ambulatory Visit | Attending: Pulmonary Disease | Admitting: Pulmonary Disease

## 2012-10-18 ENCOUNTER — Encounter (HOSPITAL_COMMUNITY)
Admission: RE | Admit: 2012-10-18 | Discharge: 2012-10-18 | Disposition: A | Discharge status: Home / Self Care | Payer: Medicare Other | Source: Ambulatory Visit | Attending: Emergency Medicine | Admitting: Emergency Medicine

## 2012-10-20 ENCOUNTER — Encounter (HOSPITAL_COMMUNITY)
Admission: RE | Admit: 2012-10-20 | Discharge: 2012-10-20 | Disposition: A | Discharge status: Home / Self Care | Payer: Medicare Other | Source: Ambulatory Visit | Attending: Pulmonary Disease | Admitting: Pulmonary Disease

## 2012-10-21 NOTE — Progress Notes (Signed)
Sirr graduated yesterday and plans to continue exercise on his own.

## 2012-10-25 ENCOUNTER — Encounter (HOSPITAL_COMMUNITY): Payer: Medicare Other

## 2012-10-27 ENCOUNTER — Encounter (HOSPITAL_COMMUNITY): Payer: Medicare Other

## 2012-11-01 ENCOUNTER — Encounter (HOSPITAL_COMMUNITY): Payer: Medicare Other

## 2012-11-03 ENCOUNTER — Encounter (HOSPITAL_COMMUNITY): Payer: Medicare Other

## 2012-11-29 ENCOUNTER — Ambulatory Visit (INDEPENDENT_AMBULATORY_CARE_PROVIDER_SITE_OTHER): Payer: Medicare Other | Admitting: Pulmonary Disease

## 2012-11-29 ENCOUNTER — Telehealth: Payer: Self-pay | Admitting: Pulmonary Disease

## 2012-11-29 ENCOUNTER — Encounter: Payer: Self-pay | Admitting: Pulmonary Disease

## 2012-11-29 VITALS — BP 118/74 | HR 94 | Temp 97.8°F | Ht 63.0 in | Wt 129.0 lb

## 2012-11-29 DIAGNOSIS — R0902 Hypoxemia: Secondary | ICD-10-CM

## 2012-11-29 DIAGNOSIS — J449 Chronic obstructive pulmonary disease, unspecified: Secondary | ICD-10-CM

## 2012-11-29 MED ORDER — FLUTICASONE FUROATE-VILANTEROL 100-25 MCG/INH IN AEPB: 1.0000 | INHALATION_SPRAY | Freq: Every day | RESPIRATORY_TRACT | Status: DC

## 2012-11-29 NOTE — Telephone Encounter (Signed)
Reviewed  Note  From [facility]- Dr Stann Mainland Diltiazem stopped due to pedal edema & valsartan 80 added Lt & rt heart cath planned- no evidence of graft dysfunction clinically

## 2012-11-29 NOTE — Assessment & Plan Note (Signed)
Ct 3L O2 during sleep

## 2012-11-29 NOTE — Assessment & Plan Note (Signed)
Lung function is stable at 49% Stay on spiriva Trial of Breo once daily - rinse mouth after use -call for Rx if this works Trial of claritin or Zyrtec for allergies if OK with your heart

## 2012-11-29 NOTE — Progress Notes (Signed)
Subjective:    Patient ID: Luke Oh., male    DOB: 10-Sep-1940, 72 y.o.   MRN: [id number]  HPI PCP - Swayne  Cards - [facility] , felker, transplant  72 y.o. 32 PY ex smoker (quit '95) presents for  FU of COPD.  PMH ischemic cardiomyopathy, status post heart transplant in 1996, on immunosuppressants,ascending aortic aneurysm followed at [facility], hypertension, hyperlipidemia, heparin-induced thrombocytopenia  He had  progressive SOB for several weeks to months.  He was noted to have a left lingular infiltrate on chest x-ray from 8/27 in the ED but signed out AMA however returned 2 days back with persistent dyspnea and admitted 8/29 to 03/13/12 treated as HCAP (due to recent adm at [facility] for echo/cath) with IV Vanco, cefepime and Levaquin (day 3)  - sputum culture and urine Legionella negative , BNP 492  His repeat chest x-ray showed some improvement in pneumonia  CT angio chest showed Emphysema with interstitial thickening, most compatible with  interstitial pulmonary edema. Small right and trace left pleural effusion. pulm HTN & 4.1 cm ascending aortic aneurysm below site of anastomosis.  -Improved with nebs & short course of po prednisone.He was discharged on albuterol puffs, advair and spiriva but did not fill Rx.  -his o2 sats are 92 on RA but drops to 87 on ambulation. Recommended for o2 via [place] on ambulation on discharge but patient refused and wants to see his PCP and pulmonologist before deciding on home o2.  Dyspnea is improved, reports pedal edema  He had PFTs done at [facility] a year ago - was told that his 'lungs are not emptying fully'.  Spirometry showed moderate airway obstruction with fev 1 of 46%, FVC 68%, ratio 50  He does desaturate to 87% on walking but recovers at rest   04/26/2012  ONO >> significant desaturations during sleep - Will benefit from 2 L O2 use during sleep ONO on 2L still shows significant desatn 5.5h Increase to 2.5 L ONO on 2.5 L O2 shows desatn for 3h 45 m (46%  of night)  - Increase to 3L O2    11/29/2012 Completed pulm rehab - would use o2 during exercise Compliant with nocturnal o2, c/o dry nose Breathing has slightly improved. Reports DOE, coughing with production of white/milky mucus - worse in early am. Denies chest tightness or wheezing. HR better on cardizem Spiriva helping somewhat  Cath planned later this week at [facility] done there -was told about changes of COPD FEV1 49%   Past Medical History  Diagnosis Date  . Hypertension   . Shortness of breath   . Hypercholesteremia   . 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 COPD (chronic obstructive pulmonary disease)   . AAA (abdominal aortic aneurysm)   . Arrhythmia has had episodes of fast heart rate    Review of Systems neg for any significant sore throat, dysphagia, itching, sneezing, nasal congestion or excess/ purulent secretions, fever, chills, sweats, unintended wt loss, pleuritic or exertional cp, hempoptysis, orthopnea pnd or change in chronic leg swelling. Also denies presyncope, palpitations, heartburn, abdominal pain, nausea, vomiting, diarrhea or change in bowel or urinary habits, dysuria,hematuria, rash, arthralgias, visual complaints, headache, numbness weakness or ataxia.     Objective:   Physical Exam   Gen. Pleasant, well-nourished, in no distress, normal affect ENT - no lesions, no post nasal drip Neck: No JVD, no thyromegaly, no carotid bruits Lungs: no use of accessory muscles, no dullness to percussion, LLL rales, no rhonchi  Cardiovascular: Rhythm regular, heart sounds  normal, no murmurs or gallops, no peripheral edema Abdomen: soft and non-tender, no hepatosplenomegaly, BS normal. Musculoskeletal: No deformities, no cyanosis or clubbing Neuro:  alert, non focal        Assessment & Plan:

## 2012-11-29 NOTE — Patient Instructions (Addendum)
Lung function is stable at 49% Stay on spiriva Trial of Breo once daily - rinse mouth after use -call for Rx if this works Trial of claritin or Zyrtec for allergies if OK with your heart doctors

## 2012-12-22 ENCOUNTER — Telehealth: Payer: Self-pay | Admitting: Pulmonary Disease

## 2012-12-22 MED ORDER — FLUTICASONE FUROATE-VILANTEROL 100-25 MCG/INH IN AEPB: 1.0000 | INHALATION_SPRAY | Freq: Every day | RESPIRATORY_TRACT | Status: DC

## 2012-12-22 NOTE — Telephone Encounter (Signed)
I spoke with pt. He isa ware 2 samples left upfront for p/u. Nothing further was needed  --also aware these were left in [place] office

## 2013-02-03 ENCOUNTER — Telehealth: Payer: Self-pay | Admitting: Pulmonary Disease

## 2013-02-03 MED ORDER — FLUTICASONE FUROATE-VILANTEROL 100-25 MCG/INH IN AEPB: 1.0000 | INHALATION_SPRAY | Freq: Every day | RESPIRATORY_TRACT | Status: DC

## 2013-02-03 NOTE — Telephone Encounter (Deleted)
1 

## 2013-02-03 NOTE — Telephone Encounter (Signed)
I spoke with pt and he is requesting samples of breo. I have done so left upfront in [place] office. Nothing further was needed

## 2013-03-20 ENCOUNTER — Telehealth: Payer: Self-pay | Admitting: Pulmonary Disease

## 2013-03-20 NOTE — Telephone Encounter (Signed)
Reviewed [facility] note - CI is 1.4 , Lt Cx 80% occlusion, SvO2 was 47 in setting of hypoxia -stress test planned - ? PCI candidate

## 2013-04-04 ENCOUNTER — Ambulatory Visit (INDEPENDENT_AMBULATORY_CARE_PROVIDER_SITE_OTHER): Payer: Medicare Other | Admitting: Pulmonary Disease

## 2013-04-04 ENCOUNTER — Encounter: Payer: Self-pay | Admitting: Pulmonary Disease

## 2013-04-04 VITALS — BP 130/78 | HR 90 | Temp 98.1°F | Ht 62.0 in | Wt 132.5 lb

## 2013-04-04 DIAGNOSIS — Z23 Encounter for immunization: Secondary | ICD-10-CM

## 2013-04-04 DIAGNOSIS — R0902 Hypoxemia: Secondary | ICD-10-CM

## 2013-04-04 DIAGNOSIS — J449 Chronic obstructive pulmonary disease, unspecified: Secondary | ICD-10-CM

## 2013-04-04 NOTE — Assessment & Plan Note (Signed)
Flu shot Alternatives to Breo inlcude Advair, Symbicort & Dulera - he will check his formulary & call us back to substitute

## 2013-04-04 NOTE — Patient Instructions (Addendum)
Flu shot Ambulatory satn - you will qualify for portable oxygen Alternatives to Breo inlcude Advair, Symbicort & Dulera - we will await your call

## 2013-04-04 NOTE — Assessment & Plan Note (Signed)
Ambulatory satn - you will qualify for portable oxygen  On noct O2 3 L

## 2013-04-04 NOTE — Progress Notes (Signed)
  Subjective:    Patient ID: Luke Oh., male    DOB: 1940/09/01, 72 y.o.   MRN: [id number]  HPI  PCP - Swayne  Cards - [facility] , felker, transplant   72 y.o. 10 PY ex smoker (quit '95) presents for FU of COPD.  PMH ischemic cardiomyopathy, status post heart transplant in 1996, on immunosuppressants,ascending aortic aneurysm followed at [facility], hypertension, hyperlipidemia, heparin-induced thrombocytopenia     03/2012 treated as HCAP CT angio chest showed Emphysema with interstitial thickening, most compatible with interstitial pulmonary edema. Small right and trace left pleural effusion. pulm HTN & 4.1 cm ascending aortic aneurysm below site of anastomosis.  Marland Kitchen  Spirometry 03/2012 showed moderate airway obstruction with fev 1 of 46%, FVC 68%, ratio 50  04/26/2012  ONO on 2.5 L O2 shows desatn for 3h 26 m (46% of night)  - Increased  to 3L O2   11/29/2012  Completed pulm rehab - would use o2 during exercise  FEV1 49%    04/04/2013 Reviewed Note From [facility]- Dr Stann Mainland  Diltiazem stopped due to pedal edema & valsartan 80 added  Lt & rt heart cath planned- no evidence of graft dysfunction clinically  Pt reports his breathing has been "fair". He takes breo every AM and spiriva every night. The breo will cost him $300 for refill and can't afford this.Pt c/o body aches, cough w/ light cream color phlem that all started this AM. Denies any wheezing and no chest tx. He uses 3 liters O2 at bedtime.  He desatn on walking on RA & takes a long time to recover   Review of Systems neg for any significant sore throat, dysphagia, itching, sneezing, nasal congestion or excess/ purulent secretions, fever, chills, sweats, unintended wt loss, pleuritic or exertional cp, hempoptysis, orthopnea pnd or change in chronic leg swelling. Also denies presyncope, palpitations, heartburn, abdominal pain, nausea, vomiting, diarrhea or change in bowel or urinary habits, dysuria,hematuria, rash, arthralgias, visual  complaints, headache, numbness weakness or ataxia.     Objective:   Physical Exam  Gen. Pleasant, well-nourished, in no distress ENT - no lesions, no post nasal drip Neck: No JVD, no thyromegaly, no carotid bruits Lungs: no use of accessory muscles, no dullness to percussion, clear without rales or rhonchi  Cardiovascular: Rhythm regular, heart sounds  normal, no murmurs or gallops, no peripheral edema Musculoskeletal: No deformities, no cyanosis or clubbing        Assessment & Plan:

## 2013-06-13 ENCOUNTER — Other Ambulatory Visit: Payer: Self-pay | Admitting: Family Medicine

## 2013-06-13 ENCOUNTER — Ambulatory Visit
Admission: RE | Admit: 2013-06-13 | Discharge: 2013-06-13 | Disposition: A | Discharge status: Home / Self Care | Payer: Self-pay | Source: Ambulatory Visit | Attending: Family Medicine | Admitting: Family Medicine

## 2013-06-13 DIAGNOSIS — J441 Chronic obstructive pulmonary disease with (acute) exacerbation: Secondary | ICD-10-CM

## 2013-06-19 ENCOUNTER — Other Ambulatory Visit: Payer: Self-pay | Admitting: Pulmonary Disease

## 2013-06-23 ENCOUNTER — Telehealth: Payer: Self-pay | Admitting: Pulmonary Disease

## 2013-06-23 MED ORDER — FLUTICASONE FUROATE-VILANTEROL 100-25 MCG/INH IN AEPB: 1.0000 | INHALATION_SPRAY | Freq: Every day | RESPIRATORY_TRACT | Status: DC

## 2013-06-23 NOTE — Telephone Encounter (Signed)
lmomtcb x1--rx has been sent

## 2013-06-26 ENCOUNTER — Ambulatory Visit (INDEPENDENT_AMBULATORY_CARE_PROVIDER_SITE_OTHER): Payer: Medicare Other | Admitting: Pulmonary Disease

## 2013-06-26 ENCOUNTER — Encounter: Payer: Self-pay | Admitting: Pulmonary Disease

## 2013-06-26 VITALS — BP 122/78 | HR 97 | Temp 98.2°F | Ht 62.0 in | Wt 135.0 lb

## 2013-06-26 DIAGNOSIS — I509 Heart failure, unspecified: Secondary | ICD-10-CM

## 2013-06-26 DIAGNOSIS — I5033 Acute on chronic diastolic (congestive) heart failure: Secondary | ICD-10-CM

## 2013-06-26 DIAGNOSIS — J449 Chronic obstructive pulmonary disease, unspecified: Secondary | ICD-10-CM

## 2013-06-26 NOTE — Telephone Encounter (Signed)
Pt aware. [place] Bing, CMA

## 2013-06-26 NOTE — Progress Notes (Signed)
Subjective:    Patient ID: Luke Jacobson., male    DOB: 1940-08-12, 72 y.o.   MRN: [id number]  HPI   PCP - Swayne  Cards - [facility] , felker, transplant   72 y.o. 41 PY ex smoker (quit '95) presents for FU of COPD.  PMH ischemic cardiomyopathy, status post heart transplant in 1996, on immunosuppressants,ascending aortic aneurysm followed at [facility], hypertension, hyperlipidemia, heparin-induced thrombocytopenia  03/2012 treated as HCAP  CT angio chest showed Emphysema with interstitial thickening, most compatible with interstitial pulmonary edema. Small right and trace left pleural effusion. pulm HTN & 4.1 cm ascending aortic aneurysm below site of anastomosis.  Marland Kitchen  Spirometry 03/2012 showed moderate airway obstruction with fev 1 of 46%, FVC 68%, ratio 50   04/26/2012 ONO on 2.5 L O2 shows desatn for 3h 29 m (46% of night)  - Increased to 3L O2  11/29/2012 Completed pulm rehab - would use o2 during exercise  FEV1 49%    04/04/2013  Reviewed Note From [facility]- Dr Stann Mainland  Diltiazem stopped due to pedal edema & valsartan 80 added  Lt & rt heart cath planned- no evidence of graft dysfunction clinically      06/26/2013  Chief Complaint  Patient presents with  . Acute Visit    Pt reports he has had a cold x 3 weeks. C/o increase SOB, prod cough--white-light tan phlem, wheezing, chest tx, nasal congestion at bedtime, runny nose in daytime, PND. Pt was on round of levaquin and pred taper by PCP--Now finished. Pt uses 2 liters 02 at bedtime and prn during the day. pt was 87% RA.    Took levaquin x 14ds & pred taper CXR 12/2 small effusions & fluid in fissure  He uses 3 liters O2 at bedtime.  He continues to desatn on walking on RA & takes a long time to recover   Review of Systems Gen. Pleasant, well-nourished, in no distress ENT - no lesions, no post nasal drip Neck: No JVD, no thyromegaly, no carotid bruits Lungs: no use of accessory muscles, no dullness to percussion, clear without  rales or rhonchi  Cardiovascular: Rhythm regular, heart sounds  normal, no murmurs or gallops, no peripheral edema Musculoskeletal: No deformities, no cyanosis or clubbing      Objective:   Physical Exam  Gen. Pleasant, well-nourished, in no distress ENT - no lesions, no post nasal drip Neck: No JVD, no thyromegaly, no carotid bruits Lungs: no use of accessory muscles, no dullness to percussion, clear without rales or rhonchi  Cardiovascular: Rhythm regular, heart sounds  normal, no murmurs or gallops, no peripheral edema Musculoskeletal: No deformities, no cyanosis or clubbing        Assessment & Plan:

## 2013-06-26 NOTE — Assessment & Plan Note (Signed)
Take lasix daily x 3 days - weigh yourself twice a week

## 2013-06-26 NOTE — Patient Instructions (Signed)
Stay on [place] will qualify for portable oxygen Take lasix daily x 3 days - weigh yourself twice a week Call for green/ yellow sputum or wheezing

## 2013-06-26 NOTE — Assessment & Plan Note (Signed)
Treated for flare Stay on breo & spiriva You will qualify for portable oxygen Call for green/ yellow sputum or wheezing

## 2013-11-16 ENCOUNTER — Telehealth: Payer: Self-pay | Admitting: Pulmonary Disease

## 2013-11-16 MED ORDER — FLUTICASONE FUROATE-VILANTEROL 100-25 MCG/INH IN AEPB: 1.0000 | INHALATION_SPRAY | Freq: Every day | RESPIRATORY_TRACT | Status: DC

## 2013-11-16 NOTE — Telephone Encounter (Signed)
Pt aware samples left for pick up in [place] office. Nothing further needed

## 2013-11-23 ENCOUNTER — Encounter: Payer: Self-pay | Admitting: Pulmonary Disease

## 2013-11-23 ENCOUNTER — Ambulatory Visit (INDEPENDENT_AMBULATORY_CARE_PROVIDER_SITE_OTHER): Payer: Medicare Other | Admitting: Pulmonary Disease

## 2013-11-23 VITALS — BP 118/72 | HR 76 | Ht 67.0 in | Wt 130.5 lb

## 2013-11-23 DIAGNOSIS — J449 Chronic obstructive pulmonary disease, unspecified: Secondary | ICD-10-CM

## 2013-11-23 DIAGNOSIS — R0902 Hypoxemia: Secondary | ICD-10-CM

## 2013-11-23 MED ORDER — AZELASTINE-FLUTICASONE 137-50 MCG/ACT NA SUSP: 1.0000 | Freq: Every day | NASAL | Status: DC

## 2013-11-23 NOTE — Assessment & Plan Note (Signed)
We will get you portable oxygen

## 2013-11-23 NOTE — Progress Notes (Signed)
   Subjective:    Patient ID: [name], male    DOB: 09/05/1940, 73 y.o.   MRN: [id number]  HPI  PCP - Swayne  Cards - [facility] , felker, transplant   73 y.o. 37 PY ex smoker (quit '95) presents for FU of COPD.  PMH ischemic cardiomyopathy, status post heart transplant in 1996, on immunosuppressants,ascending aortic aneurysm followed at [facility], hypertension, hyperlipidemia, heparin-induced thrombocytopenia  03/2012 treated as HCAP  CT angio chest showed Emphysema with interstitial thickening, most compatible with interstitial pulmonary edema. Small right and trace left pleural effusion. pulm HTN & 4.1 cm ascending aortic aneurysm below site of anastomosis.    Spirometry 03/2012 showed moderate airway obstruction with fev 1 of 46%, FVC 68%, ratio 50  04/26/2012 ONO on 2.5 L O2 shows desatn for 3h 45 m (46% of night) - Increased to 3L O2  11/29/2012 Completed pulm rehab - would use o2 during exercise  FEV1 49%     06/26/2013 Took levaquin x 14ds & pred taper  CXR 12/2 small effusions & fluid in fissure   11/23/2013  Chief Complaint  Patient presents with  . Follow-up    reprots breathing is worse from last OV. ? d/t pollen. Overtime breathing getting worse. C/o prod cough-white phlem at times, PND.  NO wheezing, no chest tx, no runny nose, no scratchy throat. Pt uses O2 at bedtime. Pt was 82% RA. After sitting for few minutes pt recovered to 90% RAa   Pollen season is gen bad for him He uses 3 liters O2 at bedtime. No portable o2 He continues to desatn on walking on RA & takes a long time to recover Cardiac MRI planned next week at [facility] , then cath   Review of Systems neg for any significant sore throat, dysphagia, itching, sneezing, nasal congestion or excess/ purulent secretions, fever, chills, sweats, unintended wt loss, pleuritic or exertional cp, hempoptysis, orthopnea pnd or change in chronic leg swelling. Also denies presyncope, palpitations, heartburn, abdominal pain,  nausea, vomiting, diarrhea or change in bowel or urinary habits, dysuria,hematuria, rash, arthralgias, visual complaints, headache, numbness weakness or ataxia.     Objective:   Physical Exam  Gen. Pleasant, well-nourished, in no distress ENT - no lesions, no post nasal drip Neck: No JVD, no thyromegaly, no carotid bruits Lungs: no use of accessory muscles, no dullness to percussion, clear without rales or rhonchi  Cardiovascular: Rhythm regular, heart sounds  normal, no murmurs or gallops, 1+ peripheral edema Musculoskeletal: No deformities, no cyanosis or clubbing        Assessment & Plan:

## 2013-11-23 NOTE — Patient Instructions (Signed)
Nasal spray each nare at bedtime -sample Take lasix x 2 days, then ask heart doctors after cath how to take this (by weight?) Stay on spiriva & breo We will get you portable oxygen

## 2013-11-23 NOTE — Assessment & Plan Note (Signed)
Nasal spray each nare at bedtime -sample Take lasix x 2 days, then ask heart doctors after cath how to take this (by weight?) Stay on spiriva & breo

## 2013-11-23 NOTE — Addendum Note (Signed)
Addended by: Inge Rise on: 11/23/2013 04:12 PM   Modules accepted: Orders

## 2013-12-21 ENCOUNTER — Encounter: Payer: Self-pay | Admitting: Pulmonary Disease

## 2013-12-21 ENCOUNTER — Ambulatory Visit (INDEPENDENT_AMBULATORY_CARE_PROVIDER_SITE_OTHER): Payer: Medicare Other | Admitting: Pulmonary Disease

## 2013-12-21 VITALS — BP 130/76 | HR 87 | Ht 67.0 in | Wt 127.0 lb

## 2013-12-21 DIAGNOSIS — I2789 Other specified pulmonary heart diseases: Secondary | ICD-10-CM

## 2013-12-21 DIAGNOSIS — J449 Chronic obstructive pulmonary disease, unspecified: Secondary | ICD-10-CM

## 2013-12-21 DIAGNOSIS — I272 Pulmonary hypertension, unspecified: Secondary | ICD-10-CM | POA: Insufficient documentation

## 2013-12-21 NOTE — Assessment & Plan Note (Addendum)
His lung function is stable, did not feel that dyspnea is attributable to worsening COPD Stay on oxygen - we will have [facility] evaluate you for portable concentrator Stay on breo/ spiriva -lung function appears satble Call for new issues

## 2013-12-21 NOTE — Assessment & Plan Note (Addendum)
By exclusion this seems to be the cause of his worsening dyspnea. Although I'm not sure if this has indeed worsened over the past few years. The low DLCO out of proportion to his degree of airway obstruction is likely due to pulmonary vascular disease.  Increase sildenafil to 20 mg gradually over next 2 weeks -call for dizziness Clearly oxygen is as important to prevent worsening of pulmonary hypertension.

## 2013-12-21 NOTE — Progress Notes (Signed)
   Subjective:    Patient ID: [name], male    DOB: 09-08-1940, 73 y.o.   MRN: [id number]  HPI  PCP - Swayne  Cards - [facility] , felker, transplant    73 y.o. 46 PY ex smoker (quit '95) presents for FU of COPD.  PMH ischemic cardiomyopathy, status post heart transplant in 1996, on immunosuppressants,ascending aortic aneurysm followed at [facility], hypertension, hyperlipidemia, heparin-induced thrombocytopenia  03/2012 treated as HCAP  CT angio chest showed Emphysema with interstitial thickening, most compatible with interstitial pulmonary edema. Small right and trace left pleural effusion. pulm HTN & 4.1 cm ascending aortic aneurysm below site of anastomosis.  Spirometry 03/2012 showed moderate airway obstruction with fev 1 of 46%, FVC 68%, ratio 50  04/26/2012 ONO on 2.5 L O2 shows desatn for 3h 45 m (46% of night) - Increased to 3L O2  11/29/2012 Completed pulm rehab - FEV1 49%      12/21/2013  Chief Complaint  Patient presents with  . Follow-up    Pt was hospitalized at [facility] since LV for increased fatigue.  Had 2 heart caths, CT chest, and cxr.  Was dx'ed with Pulmonary HTN at [facility] during this stay.    Underwent extensive evaluation at [facility] CT neg PE RHC RA 2, PCWP 5, PA 44/21 ,mean 32, CI 1.7 --> given volume, VQ very low prob CT chest/abd/pelvis neg for malignancy PFTs - FVC 76%, FEV1 52% , ratio 53, DLCO 20%  Rpt RHC , iNO improved CI from 1.6 to 2.3  Started On sildenafil for [place] Continues to complain of dyspnea, he is more compliant with oxygen now although this has limited his activity  Past Medical History  Diagnosis Date  . Hypertension   . Shortness of breath   . Hypercholesteremia   . 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 COPD (chronic obstructive pulmonary disease)   . AAA (abdominal aortic aneurysm)   . Arrhythmia has had episodes of fast heart rate      Review of Systems neg for any significant sore throat, dysphagia, itching, sneezing, nasal  congestion or excess/ purulent secretions, fever, chills, sweats, unintended wt loss, pleuritic or exertional cp, hempoptysis, orthopnea pnd or change in chronic leg swelling. Also denies presyncope, palpitations, heartburn, abdominal pain, nausea, vomiting, diarrhea or change in bowel or urinary habits, dysuria,hematuria, rash, arthralgias, visual complaints, headache, numbness weakness or ataxia.     Objective:   Physical Exam  Gen. Pleasant, well-nourished, in no distress ENT - no lesions, no post nasal drip Neck: No JVD, no thyromegaly, no carotid bruits Lungs: no use of accessory muscles, no dullness to percussion, clear without rales or rhonchi  Cardiovascular: Rhythm regular, heart sounds  normal, no murmurs or gallops, no peripheral edema Musculoskeletal: No deformities, no cyanosis or clubbing        Assessment & Plan:

## 2013-12-21 NOTE — Patient Instructions (Signed)
Increase sildenafil to 20 mg gradually over next 2 weeks -call for dizziness Stay on oxygen - we will have [facility] evaluate you for portable concentrator Stay on breo/ spiriva -lung function appears satble Call for new issues

## 2013-12-29 ENCOUNTER — Telehealth: Payer: Self-pay | Admitting: Pulmonary Disease

## 2013-12-29 NOTE — Telephone Encounter (Signed)
Per OV 12/21/13;  Patient Instructions      Increase sildenafil to 20 mg gradually over next 2 weeks -call for dizziness Stay on oxygen - we will have [facility] evaluate you for portable concentrator Stay on breo/ spiriva -lung function appears satble Call for new issues  --   ATC PT line busy wcb

## 2014-01-01 MED ORDER — SILDENAFIL CITRATE 20 MG PO TABS: 20.0000 mg | ORAL_TABLET | Freq: Two times a day (BID) | ORAL | Status: DC

## 2014-01-01 MED ORDER — FLUTICASONE FUROATE-VILANTEROL 100-25 MCG/INH IN AEPB: 1.0000 | INHALATION_SPRAY | Freq: Every day | RESPIRATORY_TRACT | Status: DC

## 2014-01-01 NOTE — Telephone Encounter (Signed)
OK to call sildenafil bid x 60 tabs breo ok x 3 refills

## 2014-01-01 NOTE — Telephone Encounter (Signed)
I called made aware rx's have been sent. Nothing further needed

## 2014-01-01 NOTE — Telephone Encounter (Signed)
Called spoke with pt. He reports he was taking sildenafil 20 mg 1/2 tab BID as directed by [facility]. Pt reports Dr. Elsworth Soho told him to increase to 20 mg 1 tab BId. Wants RX Sildenafil 20 BID called [facility] [address] Also pt wants a RX for BREO called into [facility]. Please advise Dr. Elsworth Soho thanks

## 2014-01-07 ENCOUNTER — Other Ambulatory Visit: Payer: Self-pay | Admitting: Pulmonary Disease

## 2014-01-10 ENCOUNTER — Telehealth: Payer: Self-pay | Admitting: Pulmonary Disease

## 2014-01-10 NOTE — Telephone Encounter (Signed)
ATC PT NA WCB 

## 2014-01-10 NOTE — Telephone Encounter (Signed)
Go back to reduce dosage

## 2014-01-10 NOTE — Telephone Encounter (Signed)
Called spoke with patient who reported that his Sildenafil was increased per the 6.11.15 ov w/ RA and the 6.19.15 phone note.  Pt began the 90m BID on 6/28, 6/29, and 6/30 and is now experiencing dizziness, lightheadedness and bilateral LE edema in the feet/ankles/ legs.  Pt stated he tolerated the 273mQAM and 104mHS well with no side effects.  Pt denies any fainting or blacking out.  Dr AlvElsworth Sohoease advise, thank you.

## 2014-01-11 NOTE — Telephone Encounter (Signed)
Called, spoke with pt.  Informed him of below recs per Dr. Elsworth Soho.  He verbalized understanding and voiced no further questions or concerns at this time.  Pt is to call back if symptoms do not improve or worsen and seek emergency care if needed.  MAR updated.

## 2014-02-13 ENCOUNTER — Telehealth: Payer: Self-pay | Admitting: Pulmonary Disease

## 2014-02-13 DIAGNOSIS — J449 Chronic obstructive pulmonary disease, unspecified: Secondary | ICD-10-CM

## 2014-02-13 NOTE — Telephone Encounter (Signed)
ATC PT NA WCB

## 2014-02-14 NOTE — Telephone Encounter (Signed)
ATC NA WCB

## 2014-02-15 NOTE — Telephone Encounter (Signed)
Called and spoke to Luke Jacobson with [facility] and they are stating that he will need a new order for POC evaluation d/t pt needing a higher liter flow than some POC can provide. Order placed for evaluation for POC with [facility].   Pt last seen on 12/21/2013 with RA:  Increase sildenafil to 20 mg gradually over next 2 weeks -call for dizziness  Stay on oxygen - we will have [facility] evaluate you for portable concentrator  Stay on breo/ spiriva -lung function appears satble  Call for new issues

## 2014-02-15 NOTE — Telephone Encounter (Signed)
Called and made pt aware of status and [facility] will be contacting him to set up evaluation. Pt verbalized understanding and denied any further questions or concerns at this time.

## 2014-02-15 NOTE — Telephone Encounter (Signed)
Called and spoke to pt. Pt stated he was informed by [facility] that the POC will only go up to a 3lpm flow rate, pt is at 3.5lpm flow rate. [facility] told pt that an order will need to be sent in stating pt's flow rate can be changed to 3lpm.

## 2014-03-05 ENCOUNTER — Other Ambulatory Visit: Payer: Self-pay | Admitting: Pulmonary Disease

## 2014-03-07 ENCOUNTER — Telehealth: Payer: Self-pay | Admitting: Pulmonary Disease

## 2014-03-07 NOTE — Telephone Encounter (Signed)
RA please advise on this refill of medication for the pt and quantity you would like to give if any.  Thanks  Allergies  Allergen Reactions  . Heparin     Made white blood count go down. HIT    Current Outpatient Prescriptions on File Prior to Visit  Medication Sig Dispense Refill  . acetaminophen (TYLENOL) 500 MG tablet Take 500 mg by mouth daily.      Marland Kitchen albuterol (PROAIR HFA) 108 (90 BASE) MCG/ACT inhaler Inhale 2 puffs into the lungs every 6 (six) hours as needed.  1 Inhaler  3  . aspirin EC 81 MG tablet Take 81 mg by mouth daily.      . Azelastine-Fluticasone (DYMISTA) 137-50 MCG/ACT SUSP Place 1 puff into the nose at bedtime.  1 Bottle  0  . cycloSPORINE modified (NEORAL) 25 MG capsule Take 100 mg by mouth 2 (two) times daily.       . fexofenadine (ALLEGRA) 180 MG tablet Take 180 mg by mouth daily.      . Fluticasone Furoate-Vilanterol (BREO ELLIPTA) 100-25 MCG/INH AEPB Inhale 1 puff into the lungs daily.  60 each  3  . GARLIC PO Take 1 tablet by mouth daily.      Marland Kitchen losartan (COZAAR) 100 MG tablet Take 100 mg by mouth daily.      . magnesium oxide (MAG-OX) 400 MG tablet Take 800 mg by mouth 2 (two) times daily.      . Multiple Vitamin (MULTIVITAMIN) tablet Take 1 tablet by mouth daily.      . mycophenolate (CELLCEPT) 500 MG tablet Take 1,500 mg by mouth 2 (two) times daily.      . Omega-3 Fatty Acids (FISH OIL) 1000 MG CAPS Take 1 capsule by mouth every evening.      . ranitidine (ZANTAC) 75 MG tablet Take 150 mg by mouth daily.      . sildenafil (REVATIO) 20 MG tablet Take by mouth. Take 20 mg in the morning and 10 mg at bedtime.      Marland Kitchen SPIRIVA HANDIHALER 18 MCG inhalation capsule INHALE THE CONTENTS OF ONE CAPSULE VIA HANDIHALER ONCE DAILY  30 capsule  6  . temazepam (RESTORIL) 15 MG capsule Take 30 mg by mouth at bedtime as needed. For sleep       No current facility-administered medications on file prior to visit.

## 2014-03-08 NOTE — Telephone Encounter (Signed)
rx sent. Appt set, pt aware.[place] Bing, CMA

## 2014-03-08 NOTE — Telephone Encounter (Signed)
20 bid # 60 x 1 refill Schedule OV at [facility] please

## 2014-04-05 ENCOUNTER — Encounter: Payer: Self-pay | Admitting: Pulmonary Disease

## 2014-04-05 ENCOUNTER — Telehealth: Payer: Self-pay

## 2014-04-05 ENCOUNTER — Ambulatory Visit (INDEPENDENT_AMBULATORY_CARE_PROVIDER_SITE_OTHER): Payer: BC Managed Care – HMO | Admitting: Pulmonary Disease

## 2014-04-05 ENCOUNTER — Other Ambulatory Visit: Payer: Self-pay

## 2014-04-05 VITALS — BP 132/68 | HR 86 | Ht 67.0 in | Wt 128.0 lb

## 2014-04-05 DIAGNOSIS — I272 Pulmonary hypertension, unspecified: Secondary | ICD-10-CM

## 2014-04-05 DIAGNOSIS — J449 Chronic obstructive pulmonary disease, unspecified: Secondary | ICD-10-CM

## 2014-04-05 DIAGNOSIS — I2789 Other specified pulmonary heart diseases: Secondary | ICD-10-CM

## 2014-04-05 DIAGNOSIS — Z23 Encounter for immunization: Secondary | ICD-10-CM

## 2014-04-05 DIAGNOSIS — R0902 Hypoxemia: Secondary | ICD-10-CM

## 2014-04-05 MED ORDER — TIOTROPIUM BROMIDE MONOHYDRATE 18 MCG IN CAPS: 18.0000 ug | ORAL_CAPSULE | Freq: Every day | RESPIRATORY_TRACT | Status: DC

## 2014-04-05 MED ORDER — FLUTICASONE FUROATE-VILANTEROL 100-25 MCG/INH IN AEPB: 1.0000 | INHALATION_SPRAY | Freq: Every day | RESPIRATORY_TRACT | Status: DC

## 2014-04-05 NOTE — Progress Notes (Signed)
   Subjective:    Patient ID: Luke Jacobson., male    DOB: 1940-08-13, 73 y.o.   MRN: [id number]  HPI  PCP - Swayne  Cards - [facility] , [facility], transplant   73 y.o. 50 PY ex smoker (quit '95) presents for FU of COPD.  PMH ischemic cardiomyopathy, status post heart transplant in 1996, on immunosuppressants,ascending aortic aneurysm followed at [facility], hypertension, hyperlipidemia, heparin-induced thrombocytopenia    04/05/2014  Chief Complaint  Patient presents with  . Follow-up    Pt has no breathing complaints at this time.  States he is doing well since the heat and humidity has decreased.    On Increasing sildenafil for PH  >> c/o dizziness, lightheadedness and bilateral LE edema in the feet/ankles/ legs. Pt stated he tolerated the 14m QAM and 144mQHS well with no side effects - was finally able to increase to 20 bid, now c/o pedal edema  Continues to complain of dyspnea, he is more compliant with oxygen now although this has limited his activity   [facility]  Significant tests/ events  03/2012 treated as HCAP  CT angio chest showed Emphysema with interstitial thickening, most compatible with interstitial pulmonary edema. Small right and trace left pleural effusion. pulm HTN & 4.1 cm ascending aortic aneurysm below site of anastomosis.  Spirometry 03/2012 showed moderate airway obstruction with fev 1 of 46%, FVC 68%, ratio 50  04/26/2012 ONO on 2.5 L O2 shows desatn for 3h 4565 (46% of night) - Increased to 3L O2  11/29/2012 Completed pulm rehab - FEV1 49%    [facility] , VQ very low prob  RHC RA 2, PCWP 5, PA 44/21 ,mean 32, CI 1.7 --> given volume, With  iNO improved CI from 1.6 to 2.3  CT chest/abd/pelvis neg for malignancy  PFTs - FVC 76%, FEV1 52% , ratio 53, DLCO 20%   Echo 03/2014 RVSP 61 (on sildenafil), nml LV fn, LVH    Past Medical History    Diagnosis Date  . Hypertension   . Shortness of breath   . Hypercholesteremia   . 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 COPD (chronic obstructive pulmonary disease)   . AAA (abdominal aortic aneurysm)   . Arrhythmia has had episodes of fast heart rate    Review of Systems neg for any significant sore throat, dysphagia, itching, sneezing, nasal congestion or excess/ purulent secretions, fever, chills, sweats, unintended wt loss, pleuritic or exertional cp, hempoptysis, orthopnea pnd or change in chronic leg swelling. Also denies presyncope, palpitations, heartburn, abdominal pain, nausea, vomiting, diarrhea or change in bowel or urinary habits, dysuria,hematuria, rash, arthralgias, visual complaints, headache, numbness weakness or ataxia.     Objective:   Physical Exam  Gen. Pleasant, well-nourished, in no distress, normal affect ENT - no lesions, no post nasal drip Neck: No JVD, no thyromegaly, no carotid bruits Lungs: no use of accessory muscles, no dullness to percussion, decreased without rales or rhonchi  Cardiovascular: Rhythm regular, heart sounds  normal, no murmurs or gallops, 2+ peripheral edema Abdomen: soft and non-tender, no hepatosplenomegaly, BS normal. Musculoskeletal: No deformities, no cyanosis or clubbing Neuro:  alert, non focal       Assessment & Plan:

## 2014-04-05 NOTE — Telephone Encounter (Signed)
Pt brought in patient assistance forms for [employer](Breo) and [employer](Spiriva) to be filled out as he is in the "donut hole" with his insurance.  These forms have both been faxed to their corresponding companies with all needed paperwork.  Will place copies in RA's box to hold as he does not have a nurse currently.

## 2014-04-05 NOTE — Assessment & Plan Note (Signed)
Ct portable O2 daytime

## 2014-04-05 NOTE — Patient Instructions (Signed)
Flu shot today Take lasix daily x 3 days or longer until leg swelling better/ wt at 125 lbs Trial of stopping breo - call back to report Assistance forms filled out

## 2014-04-05 NOTE — Assessment & Plan Note (Signed)
Flu shot today Trial of stopping breo - call back to report Assistance forms filled out

## 2014-04-05 NOTE — Assessment & Plan Note (Signed)
Take lasix daily x 3 days or longer until leg swelling better/ wt at 125 lbs If edema persists may have to decrease sildenafil again - willa wait rpt echo at [facility]

## 2014-04-29 ENCOUNTER — Other Ambulatory Visit: Payer: Self-pay | Admitting: Pulmonary Disease

## 2014-05-01 ENCOUNTER — Telehealth: Payer: Self-pay | Admitting: Pulmonary Disease

## 2014-05-01 MED ORDER — FLUTICASONE FUROATE-VILANTEROL 100-25 MCG/INH IN AEPB: 1.0000 | INHALATION_SPRAY | Freq: Every day | RESPIRATORY_TRACT | Status: DC

## 2014-05-01 NOTE — Telephone Encounter (Signed)
Spoke with patient-- states that [facility] is requiring him to repeat Pulmonary Rehab before surgery can take place.  Will send to RA as FYI.  Pt requesting sample of Breo 100-49mg-- placed up front. Pt states he is still waiting for patient assistance to be approved. Will contact our office is anything needed.  Nothing further needed.

## 2014-05-03 ENCOUNTER — Telehealth: Payer: Self-pay | Admitting: Pulmonary Disease

## 2014-05-03 NOTE — Telephone Encounter (Signed)
Per Dr. Elsworth Soho please advise the pt that his application for patient assistance has been denied due to his income not meeting eligibility requirements. I [id number] to advise the pt. [place] Bing, CMA

## 2014-05-04 NOTE — Telephone Encounter (Signed)
Pt returned Jennifer's call.

## 2014-05-04 NOTE — Telephone Encounter (Signed)
Spoke with the pt and notified of the below recs per [place]  He verbalized understanding  Nothing further needed

## 2014-05-14 ENCOUNTER — Telehealth: Payer: Self-pay | Admitting: Pulmonary Disease

## 2014-05-14 NOTE — Telephone Encounter (Signed)
Called spoke with pt. He is aware to call his insurance and see what is on his formulary. Once he does, he will call back

## 2014-05-15 ENCOUNTER — Telehealth: Payer: Self-pay | Admitting: Pulmonary Disease

## 2014-05-15 MED ORDER — TIOTROPIUM BROMIDE MONOHYDRATE 18 MCG IN CAPS: 18.0000 ug | ORAL_CAPSULE | Freq: Every day | RESPIRATORY_TRACT | Status: DC

## 2014-05-15 NOTE — Telephone Encounter (Signed)
Pt aware samples of spiriva left for pick up.  Nothing further needed

## 2014-05-28 ENCOUNTER — Other Ambulatory Visit: Payer: Self-pay | Admitting: Pulmonary Disease

## 2014-07-19 ENCOUNTER — Encounter: Payer: Self-pay | Admitting: Pulmonary Disease

## 2014-07-19 ENCOUNTER — Ambulatory Visit (INDEPENDENT_AMBULATORY_CARE_PROVIDER_SITE_OTHER): Payer: Medicare Other | Admitting: Pulmonary Disease

## 2014-07-19 VITALS — BP 126/82 | HR 81 | Ht 67.0 in | Wt 131.0 lb

## 2014-07-19 DIAGNOSIS — I270 Primary pulmonary hypertension: Secondary | ICD-10-CM

## 2014-07-19 DIAGNOSIS — I272 Pulmonary hypertension, unspecified: Secondary | ICD-10-CM

## 2014-07-19 DIAGNOSIS — R0902 Hypoxemia: Secondary | ICD-10-CM

## 2014-07-19 DIAGNOSIS — J449 Chronic obstructive pulmonary disease, unspecified: Secondary | ICD-10-CM

## 2014-07-19 NOTE — Progress Notes (Signed)
Subjective:    Patient ID: Luke Oh., male    DOB: 1940/12/30, 74 y.o.   MRN: [id number]  HPI  PCP - Swayne  Cards - [facility] , [facility], transplant   74 y.o. 24 PY ex smoker (quit '95) presents for FU of COPD.  PMH ischemic cardiomyopathy, status post heart transplant in 1996, on immunosuppressants,ascending aortic aneurysm [facility], hypertension, hyperlipidemia, heparin-induced thrombocytopenia    Significant tests/ events  03/2012 treated as HCAP  CT angio chest showed Emphysema with interstitial thickening, most compatible with interstitial pulmonary edema. Small right and trace left pleural effusion. pulm HTN & 4.1 cm ascending aortic aneurysm below site of anastomosis.  Spirometry 03/2012 showed moderate airway obstruction with fev 1 of 46%, FVC 68%, ratio 50  04/26/2012 ONO on 2.5 L O2 shows desatn for 3h 45 m (46% of night) - Increased to 3L O2  11/29/2012 Completed pulm rehab - FEV1 49%    12/21/2013 Dyspnea attributed to [facility] Underwent extensive evaluation at [facility]  CT neg PE , VQ very low prob  RHC RA 2, PCWP 5, PA 44/21 ,mean 32, CI 1.7 --> given volume, With iNO improved CI from 1.6 to 2.3  CT chest/abd/pelvis neg for malignancy  PFTs - FVC 76%, FEV1 52% , ratio 53, DLCO 20%   Echo 03/2014 RVSP 61 (on sildenafil), nml LV fn, LVH LHC at [facility] 03/2014 showed progression - med mx  07/19/2014  Chief Complaint  Patient presents with  . Follow-up    f/u COPD; breathing is fair, cold weather causing him breathing problems; discuss medications (Spiriva, Breo)   Pt assistance was denied for spiriva, got samples for Breo Missed sildenafil x3 days & felt it Had periods of AF-RVR over christmas Medical management of [place] recommended at [facility] 127 lbs at home Continues to complain of dyspnea, he is more compliant with oxygen now although this has limited his activity desatn on Ra on a few steps - checked - needs 2L cont O2 on walking, pulse ok at rest  Review of  Systems neg for any significant sore throat, dysphagia, itching, sneezing, nasal congestion or excess/ purulent secretions, fever, chills, sweats, unintended wt loss, pleuritic or exertional cp, hempoptysis, orthopnea pnd or change in chronic leg swelling. Also denies presyncope, palpitations, heartburn, abdominal pain, nausea, vomiting, diarrhea or change in bowel or urinary habits, dysuria,hematuria, rash, arthralgias, visual complaints, headache, numbness weakness or ataxia.     Objective:   Physical Exam  Gen. Pleasant, well-nourished, in no distress ENT - no lesions, no post nasal drip Neck: No JVD, no thyromegaly, no carotid bruits Lungs: no use of accessory muscles, no dullness to percussion, clear without rales or rhonchi  Cardiovascular: Rhythm regular, tachy,heart sounds  normal, no murmurs or gallops, no peripheral edema Musculoskeletal: No deformities, no cyanosis or clubbing        Assessment & Plan:

## 2014-07-19 NOTE — Assessment & Plan Note (Signed)
needs 2L cont on walking Lasix restart - goal 125 lbs

## 2014-07-19 NOTE — Assessment & Plan Note (Signed)
Ct sildenafil, O2

## 2014-07-19 NOTE — Assessment & Plan Note (Signed)
Stay on spiriva & breo  OK to use albuterol as needed 2L cont Oxygen when ambulating & sleep

## 2014-07-19 NOTE — Patient Instructions (Signed)
Stay on spiriva & breo  OK to use albuterol as needed Oxygen when ambulating & sleep Lasix when weight above 125 lbs

## 2014-08-07 ENCOUNTER — Telehealth: Payer: Self-pay | Admitting: Pulmonary Disease

## 2014-08-07 NOTE — Telephone Encounter (Signed)
Spoke with pt, states that we should be receiving a PA form from [employer].  I advised that we would fill it out and fax it back accordingly.

## 2014-08-09 NOTE — Telephone Encounter (Signed)
PA has been started. Form has been faxed to insurance company. Left message to make pt aware.  Will route to [place] to follow up on.

## 2014-08-09 NOTE — Telephone Encounter (Signed)
Received another form from [facility] that they are requesting to be completed.  Filled out form, awaiting RA signature.

## 2014-08-09 NOTE — Telephone Encounter (Signed)
Form has been completed and faxed back to [facility].  Awaiting approval decision.

## 2014-08-09 NOTE — Telephone Encounter (Signed)
Pt informed that PA has been started and we will inform him once info is received from [facility]. Will forward to [place].

## 2014-08-14 NOTE — Telephone Encounter (Signed)
Have received a decision? Thanks.

## 2014-08-23 NOTE — Telephone Encounter (Signed)
I have not seen anything for this patient.

## 2014-08-23 NOTE — Telephone Encounter (Signed)
We received another form to complete from [employer] on behalf of [employer] requesting more information for Ipratropium Bromide solution.  We completed the form and faxed it back to [employer] at fax number given on form.  Will hold form until we receive response.

## 2014-08-23 NOTE — Telephone Encounter (Signed)
Sharyn Lull, have you seen an approval or denial regarding this?

## 2014-08-23 NOTE — Telephone Encounter (Signed)
Looked in RA look at and no PA form there to call to check on this. Sharyn Lull, do you have this form so we can call? thanks

## 2014-08-28 NOTE — Telephone Encounter (Signed)
Called [employer] at [phone] to obtain information regarding approval for Ipratropium Bromide solution.

## 2014-08-28 NOTE — Telephone Encounter (Signed)
Please advise Sharyn Lull if you have received anything back on this patient. thanks

## 2014-08-28 NOTE — Telephone Encounter (Signed)
Called  [employer] regarding Sildenafil. Sildenafil has been approved from 08/09/14 - 08/10/15 (Disregard [employer] information, patient does not use Ipratropium Bromide.)  Nothing further needed.

## 2014-11-06 ENCOUNTER — Other Ambulatory Visit: Payer: Self-pay | Admitting: Pulmonary Disease

## 2014-12-03 ENCOUNTER — Other Ambulatory Visit: Payer: Self-pay | Admitting: Pulmonary Disease

## 2014-12-03 NOTE — Telephone Encounter (Signed)
Pt needs ov before future refills Spiriva refilled with no additional refills.

## 2015-01-04 ENCOUNTER — Other Ambulatory Visit: Payer: Self-pay | Admitting: Pulmonary Disease

## 2015-01-07 ENCOUNTER — Other Ambulatory Visit: Payer: Self-pay | Admitting: Pulmonary Disease

## 2015-01-07 NOTE — Telephone Encounter (Signed)
Patient calling about his refill for Spiriva at [facility] [address] at [phone].  Patient is out of this medication.

## 2015-01-17 ENCOUNTER — Ambulatory Visit (INDEPENDENT_AMBULATORY_CARE_PROVIDER_SITE_OTHER): Payer: Medicare Other | Admitting: Adult Health

## 2015-01-17 ENCOUNTER — Ambulatory Visit (HOSPITAL_BASED_OUTPATIENT_CLINIC_OR_DEPARTMENT_OTHER)
Admission: RE | Admit: 2015-01-17 | Discharge: 2015-01-17 | Disposition: A | Discharge status: Home / Self Care | Payer: Medicare Other | Source: Ambulatory Visit | Attending: Adult Health | Admitting: Adult Health

## 2015-01-17 ENCOUNTER — Encounter: Payer: Self-pay | Admitting: Adult Health

## 2015-01-17 VITALS — BP 145/87 | HR 79 | Temp 98.0°F | Ht 67.0 in | Wt 131.0 lb

## 2015-01-17 DIAGNOSIS — J449 Chronic obstructive pulmonary disease, unspecified: Secondary | ICD-10-CM

## 2015-01-17 DIAGNOSIS — I272 Other secondary pulmonary hypertension: Secondary | ICD-10-CM | POA: Diagnosis present

## 2015-01-17 DIAGNOSIS — I5033 Acute on chronic diastolic (congestive) heart failure: Secondary | ICD-10-CM

## 2015-01-17 DIAGNOSIS — R0902 Hypoxemia: Secondary | ICD-10-CM

## 2015-01-17 DIAGNOSIS — Z87891 Personal history of nicotine dependence: Secondary | ICD-10-CM | POA: Diagnosis not present

## 2015-01-17 DIAGNOSIS — I509 Heart failure, unspecified: Secondary | ICD-10-CM | POA: Insufficient documentation

## 2015-01-17 DIAGNOSIS — I270 Primary pulmonary hypertension: Secondary | ICD-10-CM | POA: Diagnosis not present

## 2015-01-17 NOTE — Patient Instructions (Addendum)
Chest Xray today .  Oxygen Use 3l/m at rest and then 6l/m with walking on continuous flow only .  Will need to rest walking long distances .  Continue on BREO and Spiriva .  Follow up Dr. Elsworth Soho in 6 weeks and As needed   follow up at [facility] as planned  Please contact office for sooner follow up if symptoms do not improve or worsen or seek emergency care    Late add : lasix x 2 d and then As needed

## 2015-01-17 NOTE — Progress Notes (Signed)
Subjective:    Patient ID: Luke Oh., male    DOB: [birth year], 74 y.o.   MRN: [id number]  HPI  PCP - Swayne  Cards - [facility] , felker, transplant   74 y.o. 93 PY ex smoker (quit '95) presents for FU of COPD.  PMH ischemic cardiomyopathy, status post heart transplant in 1996, on immunosuppressants,ascending aortic aneurysm followed at [facility], hypertension, hyperlipidemia, heparin-induced thrombocytopenia    Significant tests/ events  03/2012 treated as HCAP  CT angio chest showed Emphysema with interstitial thickening, most compatible with interstitial pulmonary edema. Small right and trace left pleural effusion. pulm HTN & 4.1 cm ascending aortic aneurysm below site of anastomosis.  Spirometry 03/2012 showed moderate airway obstruction with fev 1 of 46%, FVC 68%, ratio 50  04/26/2012 ONO on 2.5 L O2 shows desatn for 3h 4 m (46% of night) - Increased to 3L O2  11/29/2012 Completed pulm rehab - FEV1 49%    12/21/2013 Dyspnea attributed to [facility] Underwent extensive evaluation at [facility]  CT neg PE , VQ very low prob  RHC RA 2, PCWP 5, PA 44/21 ,mean 32, CI 1.7 --> given volume, With iNO improved CI from 1.6 to 2.3  CT chest/abd/pelvis neg for malignancy  PFTs - FVC 76%, FEV1 52% , ratio 53, DLCO 20%   Echo 03/2014 RVSP 61 (on sildenafil), nml LV fn, LVH LHC at [facility] 03/2014 showed progression - med mx  07/19/14  Pt assistance was denied for spiriva, got samples for Breo Missed sildenafil x3 days & felt it Had periods of AF-RVR over christmas Medical management of CA recommended at [facility] 127 lbs at home Continues to complain of dyspnea, he is more compliant with oxygen now although this has limited his activity desatn on Ra on a few steps - checked - needs 2L cont O2 on walking, pulse ok at rest  01/17/2015 Follow up : COPD  Patient returns for six-month follow-up. Patient says over the last 6 months that his breathing seems to be declining with more shortness of breath with  activity. Patient has moderate to severe COPD and is maintained on BREO and Spiriva. Patient is followed at [facility] for ischemic cardiomyopathy and is status post heart transplant approximately 20 years ago. He has known pulmonary hypertension and is on Revatio . Patient had a repeat echo 11/13/2014 Results were reviewed in care everywhere.-Showed worsening pulmonary artery pressures. RVSP increased from 61-96.  Patient was supposed to be on continuous flow oxygen at 2 L He admits that he takes his oxygen off intermittently with walks to see if his oxygen drops, which it does. He also uses pulsing oxygen with activity. O2 saturations dropped considerably down into the low 80s with any type of walking on pulsing oxygen. Patient needs 3 L of continuous oxygen to keep oxygen levels above 90%. Walking patient knees 6 L of oxygen to keep his oxygen above 90%. Patient has Lasix but says he rarely uses this. Weight is up about 6 pounds with lower extremity edema. Chest x-ray today shows some increased interstitial markings, questionable edema. He denies any increased cough, congestion, fever, chest pain, orthopnea. He denies any calf pain, redness.    Review of Systems neg for any significant sore throat, dysphagia, itching, sneezing, nasal congestion or excess/ purulent secretions, fever, chills, sweats, unintended wt loss, pleuritic or exertional cp, hempoptysis, orthopnea pnd or change in chronic leg swelling. Also denies presyncope, palpitations, heartburn, abdominal pain, nausea, vomiting, diarrhea or change in  bowel or urinary habits, dysuria,hematuria, rash, arthralgias, visual complaints, headache, numbness weakness or ataxia.     Objective:   Physical Exam  Gen. Pleasant, chronically ill-appearing  in no distress ENT - no lesions, no post nasal drip Neck: No JVD, no thyromegaly, no carotid bruits Lungs: no use of accessory muscles, no dullness to percussion, clear without  rales or rhonchi  Cardiovascular: Rhythm regular, tachy,heart sounds  normal, no murmurs or gallops, 1-2+ peripheral edema Musculoskeletal: No deformities, no cyanosis or clubbing        Assessment & Plan:

## 2015-01-18 ENCOUNTER — Telehealth: Payer: Self-pay | Admitting: Adult Health

## 2015-01-18 NOTE — Assessment & Plan Note (Signed)
Most recent echo shows worsening pulmonary hypertension despite Revatio  Patient is advised on oxygen compliance Would just oxygen settings to keep oxygen above 88-90%. Patient is to follow-up with [facility] as planned. To use Lasix to avoid fluid overload.

## 2015-01-18 NOTE — Telephone Encounter (Signed)
Patient calling regarding CXR results. (in [place]' results box) lmtcb.

## 2015-01-18 NOTE — Assessment & Plan Note (Signed)
Increasing oxygen needs with underlying pulmonary hypertension. Moderate to severe COPD and CHF. Patient is to increase oxygen to 3 L at rest and 6 L with activity He is to use only continuous flow O2 and avoid demand setting. Order was sent to his DME company and a continuous flow oxygen tank was sent home with patient

## 2015-01-18 NOTE — Assessment & Plan Note (Signed)
Moderate to severe COPD Continue on current regimen. Follow with Dr. Elsworth Soho in 6 weeks and as needed.

## 2015-01-18 NOTE — Assessment & Plan Note (Signed)
Suspect mildly decompensated with weight increase and lower extremity swelling. He was advised to use Lasix for the next 2 days and then as needed

## 2015-01-18 NOTE — Progress Notes (Signed)
Quick Note:  LVM for pt to return call ______

## 2015-01-21 NOTE — Telephone Encounter (Signed)
Pt aware of results per TP States that he has Lasix 58m tablets on hand. Would like to know if he is to take 1(233m tablet daily or more? Please advise TP. Thanks.

## 2015-01-21 NOTE — Telephone Encounter (Signed)
Would take Lasix 6m daily for 3 days then As needed   Low salt diet  follow up as planned and As needed   Please contact office for sooner follow up if symptoms do not improve or worsen or seek emergency care

## 2015-01-21 NOTE — Telephone Encounter (Signed)
Pt returning call for results and can be reached @ [phone].Luke Jacobson

## 2015-01-21 NOTE — Telephone Encounter (Signed)
Result Note     CXR shows increased fluid ? CHF Had LE edema on exam / Hx of CM/CHF     Would take lasix for next 1-2 days     Cont w/ ov recs     follow up as planned and As needed     Please contact office for sooner follow up if symptoms do not improve or worsen or seek emergency care    --   lmomtcb x2

## 2015-01-21 NOTE — Telephone Encounter (Signed)
Pt is aware of TP's recommendation. Nothing further was needed.

## 2015-01-22 NOTE — Progress Notes (Signed)
Reviewed & agree with plan  

## 2015-01-29 ENCOUNTER — Telehealth: Payer: Self-pay | Admitting: Adult Health

## 2015-01-29 MED ORDER — TIOTROPIUM BROMIDE MONOHYDRATE 18 MCG IN CAPS: ORAL_CAPSULE | RESPIRATORY_TRACT | Status: DC

## 2015-01-29 NOTE — Telephone Encounter (Signed)
Called and spoke to pt. Pt stated he received notification from [employer] that they need the pt assistance forms re-faxed with the spiriva script. Pt assistance information has been printed from pt's chart (in Media tab) and spiriva script was printed and given to Sharyn Lull to have RA sign on 7.20.16 when he is back in office and to then fax back to [employer].   Will forward to [place] to follow.

## 2015-01-30 NOTE — Telephone Encounter (Signed)
Prescription signed and papers re-faxed with prescription. Nothing further needed.

## 2015-02-01 ENCOUNTER — Other Ambulatory Visit: Payer: Self-pay | Admitting: Pulmonary Disease

## 2015-02-04 ENCOUNTER — Other Ambulatory Visit: Payer: Self-pay | Admitting: Pulmonary Disease

## 2015-02-06 ENCOUNTER — Telehealth: Payer: Self-pay | Admitting: Pulmonary Disease

## 2015-02-06 ENCOUNTER — Other Ambulatory Visit: Payer: Self-pay | Admitting: Pulmonary Disease

## 2015-02-06 MED ORDER — TIOTROPIUM BROMIDE MONOHYDRATE 18 MCG IN CAPS: ORAL_CAPSULE | RESPIRATORY_TRACT | Status: DC

## 2015-02-06 NOTE — Telephone Encounter (Signed)
Spoke with pt. Informed Spiriva had been sent to pharmacy. Nothing further needed.

## 2015-02-08 ENCOUNTER — Telehealth: Payer: Self-pay | Admitting: Pulmonary Disease

## 2015-02-08 NOTE — Telephone Encounter (Signed)
Called and left detailed message on voicemail advising patient that we have not heard anything back from [place] regarding his request for patient assistance.  Advised patient to call them directly, gave patient phone number to call [place].  Advised patient to call back if he has any further concerns.  Nothing further needed.

## 2015-02-14 ENCOUNTER — Other Ambulatory Visit: Payer: Self-pay | Admitting: Pulmonary Disease

## 2015-02-28 ENCOUNTER — Encounter: Payer: Self-pay | Admitting: Adult Health

## 2015-02-28 ENCOUNTER — Ambulatory Visit (INDEPENDENT_AMBULATORY_CARE_PROVIDER_SITE_OTHER): Payer: Medicare Other | Admitting: Adult Health

## 2015-02-28 VITALS — BP 132/89 | HR 82 | Ht 67.0 in | Wt 132.0 lb

## 2015-02-28 DIAGNOSIS — I270 Primary pulmonary hypertension: Secondary | ICD-10-CM

## 2015-02-28 DIAGNOSIS — J449 Chronic obstructive pulmonary disease, unspecified: Secondary | ICD-10-CM | POA: Diagnosis not present

## 2015-02-28 DIAGNOSIS — I5033 Acute on chronic diastolic (congestive) heart failure: Secondary | ICD-10-CM

## 2015-02-28 DIAGNOSIS — I272 Pulmonary hypertension, unspecified: Secondary | ICD-10-CM

## 2015-02-28 LAB — BASIC METABOLIC PANEL
BUN: 24 mg/dL (ref 7–25)
CALCIUM: 8.7 mg/dL (ref 8.6–10.3)
CO2: 24 mmol/L (ref 20–31)
Chloride: 98 mmol/L (ref 98–110)
Creat: 1.26 mg/dL — ABNORMAL HIGH (ref 0.70–1.18)
Glucose, Bld: 96 mg/dL (ref 65–99)
POTASSIUM: 5.1 mmol/L (ref 3.5–5.3)
Sodium: 132 mmol/L — ABNORMAL LOW (ref 135–146)

## 2015-02-28 NOTE — Assessment & Plan Note (Signed)
Recent flare appears improved with better oxygenation and lasix  Check labs Use lasix As needed

## 2015-02-28 NOTE — Assessment & Plan Note (Signed)
Oxygen Use 3l/m at rest and then 6l/m with walking on continuous flow only . -goal is to keep Oxygen level >88-90% Check labs with bmet and bnp  Lasix 45m daily As needed  For swelling .  Follow up Dr. AElsworth Sohoin 6 -8weeks and As needed   [facility]  Please contact office for sooner follow up if symptoms do not improve or worsen or seek emergency care

## 2015-02-28 NOTE — Assessment & Plan Note (Signed)
Continue on BREO and Spiriva .  Follow up Dr. Elsworth Soho in 6 -8weeks and As needed   Follow up at [facility] as planned  Please contact office for sooner follow up if symptoms do not improve or worsen or seek emergency care

## 2015-02-28 NOTE — Patient Instructions (Signed)
Oxygen Use 3l/m at rest and then 6l/m with walking on continuous flow only . -goal is to keep Oxygen level >88-90% Will need to rest walking long distances .  Continue on BREO and Spiriva .  Low slat diet , legs elevated.  Lasix 73m daily As needed  For swelling .  Follow up Dr. AElsworth Sohoin 6 -8weeks and As needed   [facility]  Please contact office for sooner follow up if symptoms do not improve or worsen or seek emergency care

## 2015-02-28 NOTE — Progress Notes (Signed)
Subjective:    Patient ID: Luke Oh., male    DOB: 16-Jul-1940, 75 y.o.   MRN: [id number]  HPI CP - Swayne  Cards - [facility] , felker, transplant   74 y.o. 53 PY ex smoker (quit '95) presents for FU of COPD.  PMH ischemic cardiomyopathy, status post heart transplant in 1996, on immunosuppressants,ascending aortic aneurysm followed at [facility], hypertension, hyperlipidemia, heparin-induced thrombocytopenia    Significant tests/ events  03/2012 treated as HCAP  CT angio chest showed Emphysema with interstitial thickening, most compatible with interstitial pulmonary edema. Small right and trace left pleural effusion. pulm HTN & 4.1 cm ascending aortic aneurysm below site of anastomosis.  Spirometry 03/2012 showed moderate airway obstruction with fev 1 of 46%, FVC 68%, ratio 50  04/26/2012 ONO on 2.5 L O2 shows desatn for 3h 13 m (46% of night) - Increased to 3L O2  11/29/2012 Completed pulm rehab - FEV1 49%    12/21/2013 Dyspnea attributed to [facility] Underwent extensive evaluation at [facility]  CT neg PE , VQ very low prob  RHC RA 2, PCWP 5, PA 44/21 ,mean 32, CI 1.7 --> given volume, With iNO improved CI from 1.6 to 2.3  CT chest/abd/pelvis neg for malignancy  PFTs - FVC 76%, FEV1 52% , ratio 53, DLCO 20%   Echo 03/2014 RVSP 61 (on sildenafil), nml LV fn, LVH LHC at [facility] 03/2014 showed progression - med mx  07/19/14  Pt assistance was denied for spiriva, got samples for Breo Missed sildenafil x3 days & felt it Had periods of AF-RVR over christmas Medical management of CA recommended at [facility] 127 lbs at home Continues to complain of dyspnea, he is more compliant with oxygen now although this has limited his activity desatn on Ra on a few steps - checked - needs 2L cont O2 on walking, pulse ok at rest  01/17/2015 Follow up : COPD  Patient returns for six-month follow-up. Patient says over the last 6 months that his breathing seems to be declining with more shortness of breath with  activity. Patient has moderate to severe COPD and is maintained on BREO and Spiriva. Patient is followed at [facility] for ischemic cardiomyopathy and is status post heart transplant approximately 20 years ago. He has known pulmonary hypertension and is on Revatio . Patient had a repeat echo 11/13/2014 Results were reviewed in care everywhere.-Showed worsening pulmonary artery pressures. RVSP increased from 61-96.  Patient was supposed to be on continuous flow oxygen at 2 L He admits that he takes his oxygen off intermittently with walks to see if his oxygen drops, which it does. He also uses pulsing oxygen with activity. O2 saturations dropped considerably down into the low 80s with any type of walking on pulsing oxygen. Patient needs 3 L of continuous oxygen to keep oxygen levels above 90%. Walking patient knees 6 L of oxygen to keep his oxygen above 90%. Patient has Lasix but says he rarely uses this. Weight is up about 6 pounds with lower extremity edema. Chest x-ray today shows some increased interstitial markings, questionable edema. He denies any increased cough, congestion, fever, chest pain, orthopnea. He denies any calf pain, redness. >>>lasix x 2 d then As needed    02/28/2015 Follow up : COPD, Pulmonary HTN and O2 dependent  Pt returns for 1 month follow up .  Last ov with more DOE, desats with pulsing O2 .  He was started on Lasix 65m x 2 days then As needed  Changed over to continous flow O2 at 3l rest and 6 l act  He says he is better . Feels lasix has helped .  Feels he only needs about 3l/m to keep sats >88-90% .  Ankle edema is better. Wants smaller O2 tank , it is too heavy.  Has ov with [place] in [place].  Had 2 teeth pulled, has partial . Has not been eating as well.  No chest pain, orthopnea, edema or fever.    Review of Systems Constitutional:   No  weight loss, night sweats,  Fevers, chills, + fatigue, or  lassitude.  HEENT:   No headaches,   Difficulty swallowing,  Tooth/dental problems, or  Sore throat,                No sneezing, itching, ear ache, nasal congestion, post nasal drip,   CV:  No chest pain,  Orthopnea, PND,   anasarca, dizziness, palpitations, syncope.   GI  No heartburn, indigestion, abdominal pain, nausea, vomiting, diarrhea, change in bowel habits, loss of appetite, bloody stools.   Resp:    No chest wall deformity  Skin: no rash or lesions.  GU: no dysuria, change in color of urine, no urgency or frequency.  No flank pain, no hematuria   MS:  No joint pain or swelling.  No decreased range of motion.  No back pain.  Psych:  No change in mood or affect. No depression or anxiety.  No memory loss.         Objective:   Physical Exam GEN: A/Ox3; pleasant , NAD,  Obese   HEENT:  [place]/AT,  EACs-clear, TMs-wnl, NOSE-clear, THROAT-clear, no lesions, no postnasal drip or exudate noted.   NECK:  Supple w/ fair ROM; no JVD; normal carotid impulses w/o bruits; no thyromegaly or nodules palpated; no lymphadenopathy.  RESP  Decreased BS in bases no accessory muscle use, no dullness to percussion  CARD:  RRR, no m/r/g  , tr-1 + peripheral edema, pulses intact, no cyanosis or clubbing.  GI:   Soft & nt; nml bowel sounds; no organomegaly or masses detected.  Musco: Warm bil, no deformities or joint swelling noted.   Neuro: alert, no focal deficits noted.    Skin: Warm, no lesions or rashes         Assessment & Plan:

## 2015-03-01 LAB — BRAIN NATRIURETIC PEPTIDE: Brain Natriuretic Peptide: 178.8 pg/mL — ABNORMAL HIGH (ref 0.0–100.0)

## 2015-03-04 ENCOUNTER — Telehealth: Payer: Self-pay | Admitting: Adult Health

## 2015-03-04 NOTE — Telephone Encounter (Signed)
Result Note     Labs are ok     klidney fxn is improved , K+ ok     Cot w/ ov recs     Please contact office for sooner follow up if symptoms do not improve or worsen or seek emergency care    ---  ATC PT, NA. Line rang numerous times and no answer, no VM. WCB

## 2015-03-04 NOTE — Progress Notes (Signed)
Reviewed & agree with plan  

## 2015-03-04 NOTE — Progress Notes (Signed)
Quick Note:  LVM for pt to return call. ______

## 2015-03-05 NOTE — Telephone Encounter (Signed)
Results have been explained to patient, pt expressed understanding. Nothing further needed.

## 2015-03-14 ENCOUNTER — Other Ambulatory Visit: Payer: Self-pay | Admitting: Pulmonary Disease

## 2015-03-14 HISTORY — PX: OTHER SURGICAL HISTORY: SHX169

## 2015-03-31 ENCOUNTER — Other Ambulatory Visit: Payer: Self-pay | Admitting: Pulmonary Disease

## 2015-04-04 ENCOUNTER — Telehealth: Payer: Self-pay | Admitting: Pulmonary Disease

## 2015-04-04 NOTE — Telephone Encounter (Signed)
Will forward to [place] to f/u on when she returns tomorrow 9/23

## 2015-04-05 ENCOUNTER — Other Ambulatory Visit: Payer: Self-pay | Admitting: *Deleted

## 2015-04-05 MED ORDER — FLUTICASONE FUROATE-VILANTEROL 100-25 MCG/INH IN AEPB: 1.0000 | INHALATION_SPRAY | Freq: Every day | RESPIRATORY_TRACT | Status: DC

## 2015-04-05 MED ORDER — TIOTROPIUM BROMIDE MONOHYDRATE 18 MCG IN CAPS: ORAL_CAPSULE | RESPIRATORY_TRACT | Status: DC

## 2015-04-08 NOTE — Telephone Encounter (Signed)
Pt cb, informed pt patient assistance forms are complete and ready for pick up

## 2015-04-08 NOTE — Telephone Encounter (Signed)
Patient assistance forms have been completed. Left at front to be picked up by patient. Attempted to contact patient to notify him that they are ready, no answer.  WCB

## 2015-04-10 ENCOUNTER — Telehealth: Payer: Self-pay | Admitting: Pulmonary Disease

## 2015-04-10 MED ORDER — TIOTROPIUM BROMIDE MONOHYDRATE 18 MCG IN CAPS: ORAL_CAPSULE | RESPIRATORY_TRACT | Status: DC

## 2015-04-10 NOTE — Telephone Encounter (Signed)
Spoke with pt. He is referring to his Spiriva prescription. The patient assistance program would not accept the prescription we gave him since it did not come directly from our office. A new prescription will need to be faxed to the them with our office fax cover sheet. Prescription has been printed and placed in RA's look at. Will route to [place] to follow up on.

## 2015-04-12 NOTE — Telephone Encounter (Signed)
Prescription has been printed and placed in Dr. Bari Mantis box to be signed. Will hold in my box until completed Will close encounter once done.

## 2015-04-18 NOTE — Telephone Encounter (Signed)
Rx has been signed and faxed to number provided. Nothing further needed.

## 2015-04-24 ENCOUNTER — Telehealth: Payer: Self-pay | Admitting: Pulmonary Disease

## 2015-04-24 NOTE — Telephone Encounter (Signed)
Called BI cares, verified spiriva rx.  Nothing further needed at this time.

## 2015-05-02 ENCOUNTER — Telehealth: Payer: Self-pay | Admitting: Pulmonary Disease

## 2015-05-02 ENCOUNTER — Ambulatory Visit: Payer: Medicare Other | Admitting: Pulmonary Disease

## 2015-05-02 NOTE — Telephone Encounter (Signed)
Samples have been left for pick up. Pt is aware. Nothing further was needed.

## 2015-05-13 ENCOUNTER — Other Ambulatory Visit: Payer: Self-pay | Admitting: Pulmonary Disease

## 2015-06-11 ENCOUNTER — Other Ambulatory Visit: Payer: Self-pay | Admitting: Pulmonary Disease

## 2015-06-12 ENCOUNTER — Other Ambulatory Visit: Payer: Self-pay | Admitting: Pulmonary Disease

## 2015-07-02 ENCOUNTER — Telehealth: Payer: Self-pay | Admitting: Pulmonary Disease

## 2015-07-02 NOTE — Telephone Encounter (Signed)
Awaiting Dr. Bari Mantis return. Will hold in my box until he gets back.

## 2015-07-16 MED ORDER — TIOTROPIUM BROMIDE MONOHYDRATE 18 MCG IN CAPS: ORAL_CAPSULE | RESPIRATORY_TRACT | Status: DC

## 2015-07-16 NOTE — Telephone Encounter (Signed)
Forms have been signed, Rx for Spiriva in Dr. Bari Mantis box for his signature.   Will mail forms once we have received signature on RX. Will hold in box until done.

## 2015-07-19 NOTE — Telephone Encounter (Signed)
Forms have been completed and mailed. Called and left detailed message advising patient that forms have been mailed. Copy of forms placed in Alva's scan folder to be scanned into patient's chart. Nothing further needed.

## 2015-08-05 ENCOUNTER — Telehealth: Payer: Self-pay | Admitting: Pulmonary Disease

## 2015-08-05 NOTE — Telephone Encounter (Signed)
ATC pt line rings busy [facility]

## 2015-08-06 NOTE — Telephone Encounter (Signed)
lmtcb x1 for pt.

## 2015-08-06 NOTE — Telephone Encounter (Signed)
Pt returning call.Luke Jacobson ° °

## 2015-08-06 NOTE — Telephone Encounter (Signed)
Called and spoke to pt. Pt stated he was notified by [facility] pt assistance that they faxed [place] over a form that needed to be completed for pt to continue with receiving pt assistance Spiriva. Advised pt that I would look and see if we received the form. Checked RA's look-ats, folders, and the front folders and the form has not been received. ATC pt back to inform him we have not received the form and will need it refaxed. WCB.

## 2015-08-07 NOTE — Telephone Encounter (Signed)
LMTCB

## 2015-08-07 NOTE — Telephone Encounter (Signed)
Patient Returned call [phone]

## 2015-08-07 NOTE — Telephone Encounter (Signed)
lmtcb x2 for pt. 

## 2015-08-08 NOTE — Telephone Encounter (Signed)
Called [employer] and spoke with [place].  She stated that the application was rejected because patient did not submit his proof of income with the paperwork.  Called and spoke with patient, he says that he will speak with his Advocate and decide what to do about the income part on the form and will call me back to let me know what they tell him.  Will await call back from patient Will hold in my box until completed.

## 2015-08-13 NOTE — Telephone Encounter (Signed)
Attempted to contact pt. Line was busy. Will need to try him back earlier.

## 2015-08-14 NOTE — Telephone Encounter (Signed)
Spoke with the pt  He states that he mailed in his proof of income last wk to [employer]  Nothing further needed

## 2015-08-16 ENCOUNTER — Telehealth: Payer: Self-pay | Admitting: Pulmonary Disease

## 2015-08-16 NOTE — Telephone Encounter (Signed)
lmtcb X1 for pt. Per previous ov notes, our office faxed in [place] paperwork on 07/19/15, and BI was waiting to receive pt's proof of income.  Forms have been scanned into pt's chart (08/13/15)

## 2015-08-16 NOTE — Telephone Encounter (Signed)
Pt calling back [phone]

## 2015-08-16 NOTE — Telephone Encounter (Signed)
Spoke with pt, states that BI is saying that they do not have forms from our office.  I advised that these were faxed almost a month ago, and we have the forms scanned in his chart if we need to refax these.  Pt states he will call BI to see what exactly is needed from our office, and will call us back.  Will await call.

## 2015-08-19 NOTE — Telephone Encounter (Signed)
lmtcb X1 for pt

## 2015-08-20 NOTE — Telephone Encounter (Signed)
lmtcb x2 for pt. 

## 2015-08-20 NOTE — Telephone Encounter (Signed)
Pt returned call and reported that he has spoken with his [employer] advocate who was going to reach out to [facility] this morning about the patient assistance forms/status.  Pt stated that when his advocate reaches out to him, he will call the office back.  Will await pt's call.

## 2015-08-21 NOTE — Telephone Encounter (Signed)
Patient Returned call  [phone]

## 2015-08-21 NOTE — Telephone Encounter (Signed)
Awaiting call back from patient.

## 2015-08-21 NOTE — Telephone Encounter (Signed)
OK

## 2015-08-21 NOTE — Telephone Encounter (Signed)
Called spoke with pt. He will pick up spiriva resp in [place] office. Aware he will need to be shown how to use this. Nothing further needed

## 2015-08-21 NOTE — Telephone Encounter (Signed)
LMTCB x1 for pt.  

## 2015-08-21 NOTE — Telephone Encounter (Signed)
ot returning call.Luke Jacobson

## 2015-08-21 NOTE — Telephone Encounter (Signed)
Spoke with the pt  He states that he was approved for the Spiriva Handihaler  However, med will take 7-10 days to be shipped to him  He is asking for sample, but all we have are the Spiriva Respimat inhalers  Is it okay with you to give the Respimat?  Please advise, thanks!

## 2015-08-29 ENCOUNTER — Telehealth: Payer: Self-pay | Admitting: Pulmonary Disease

## 2015-08-29 NOTE — Telephone Encounter (Signed)
Return call from pt assist.Luke Jacobson

## 2015-08-29 NOTE — Telephone Encounter (Signed)
Attempted to call Portia. Line was busy. Will try back.

## 2015-08-29 NOTE — Telephone Encounter (Signed)
Spoke with Truddie Crumble with [employer], following up on pt's BI cares 2017 forms.  I advised that our forms have been faxed, and per 08/16/15 phone note pt called our office on 2/8 saying his application has been approved.   Portia 3-way called BI cares foundation, states that forms were denied d/t lack of proof of income from the patient.  Per our records, we have documentation of income but no proof of income from patient.  Line between [place] and myself was ended.  Wcb.

## 2015-08-30 NOTE — Telephone Encounter (Signed)
Spoke with Luke Jacobson at [facility], per Luke Jacobson BI cares needs an updated application form faxed to [facility] from our office.  A form has been faxed to our office and is in RA's cubby for signature.  Will forward to michelle to follow up on.

## 2015-09-03 NOTE — Telephone Encounter (Signed)
Sharyn Lull please advise when this has been completed.  Thanks!

## 2015-09-04 NOTE — Telephone Encounter (Signed)
Portia with [employer] called back, CB [phone].  She states this has not been approved per patient assistance program for Spiriva Handihaler.  CSR rep told her that he has def not been improved and they have not recd anything since January. Asking for paperwork to be faxed again.

## 2015-09-04 NOTE — Telephone Encounter (Signed)
Called and spoke with Portia at [facility].  Advised her that patient's patient assistance has been approved on 08/21/15 and patient should receive medication in 7-10 days:   Rosana Berger, CMA at 08/21/2015 12:02 PM     Status: Signed       Expand All Collapse All   Spoke with the pt  He states that he was approved for the Spiriva Handihaler  However, med will take 7-10 days to be shipped to him  He is asking for sample, but all we have are the Spiriva Respimat inhalers  Is it okay with you to give the Respimat?  Please advise, thanks!       Called and spoke with patient, he says that he spoke with BI and they advised him that he will be getting his medications in a couple of days.  Patient says that he has enough medication to last him until Friday.  Advised patient to call me back and let me know if he does not receive his medication.

## 2015-09-09 ENCOUNTER — Telehealth: Payer: Self-pay | Admitting: Pulmonary Disease

## 2015-09-09 NOTE — Telephone Encounter (Signed)
lmtcb X1 for pt  

## 2015-09-10 NOTE — Telephone Encounter (Signed)
Forms have been faxed again to [place].  Fax Confirmation shows fax went through. Called and spoke with patient, advised him that I have re-faxed the forms, with his financial documentation and Rx.   Patient says that he will call them in a couple of days to see if they are processing it and will call me back to let me know. Will hold in my box until this is resolved.

## 2015-09-10 NOTE — Telephone Encounter (Signed)
Pt returning call.Luke Jacobson ° °

## 2015-09-10 NOTE — Telephone Encounter (Signed)
lmtcb X2 for pt.  

## 2015-09-10 NOTE — Telephone Encounter (Signed)
Luke Jacobson - See phone note 08/29/15 - Do you still have these patient assistance forms?  If so, they need to be refaxed per phone note on 08/29/15.

## 2015-09-13 NOTE — Telephone Encounter (Signed)
Forms have been sent back to [place] again by [employer] requesting another signature from Dr. Elsworth Soho. Placed on Dr. Bari Mantis desk for his signature. Will fax back after signed. Hold in my box until resolved.

## 2015-09-17 ENCOUNTER — Telehealth: Payer: Self-pay | Admitting: *Deleted

## 2015-09-17 NOTE — Telephone Encounter (Signed)
Form has been signed and faxed back. Awaiting response. Will close this encounter once I have received response.

## 2015-09-17 NOTE — Telephone Encounter (Signed)
Dr. Elsworth Soho, do you still have this form in your look at folder? Have not received signed form.

## 2015-09-17 NOTE — Telephone Encounter (Signed)
done

## 2015-09-17 NOTE — Telephone Encounter (Signed)
Pt wife calling wanting to let Dr. Elsworth Soho know that pt has appt tomorrow. He is not always forth coming with information She wanted to let Dr. Elsworth Soho know that when pt walks across the room his O2 level will drop int he 70's even on oxygen. After he rest for a few minutes he does recover into 90's. She just wanted to be sure we are aware FYI for Dr. Elsworth Soho.

## 2015-09-18 ENCOUNTER — Encounter: Payer: Self-pay | Admitting: Pulmonary Disease

## 2015-09-18 ENCOUNTER — Ambulatory Visit (INDEPENDENT_AMBULATORY_CARE_PROVIDER_SITE_OTHER): Payer: Medicare Other | Admitting: Pulmonary Disease

## 2015-09-18 VITALS — BP 132/82 | HR 82 | Ht 67.0 in | Wt 133.6 lb

## 2015-09-18 DIAGNOSIS — R0902 Hypoxemia: Secondary | ICD-10-CM | POA: Diagnosis not present

## 2015-09-18 DIAGNOSIS — I272 Other secondary pulmonary hypertension: Secondary | ICD-10-CM

## 2015-09-18 DIAGNOSIS — J849 Interstitial pulmonary disease, unspecified: Secondary | ICD-10-CM | POA: Insufficient documentation

## 2015-09-18 DIAGNOSIS — J449 Chronic obstructive pulmonary disease, unspecified: Secondary | ICD-10-CM

## 2015-09-18 MED ORDER — ALBUTEROL SULFATE HFA 108 (90 BASE) MCG/ACT IN AERS: 2.0000 | INHALATION_SPRAY | Freq: Four times a day (QID) | RESPIRATORY_TRACT | Status: AC | PRN

## 2015-09-18 MED ORDER — FLUTICASONE FUROATE-VILANTEROL 100-25 MCG/INH IN AEPB: 1.0000 | INHALATION_SPRAY | Freq: Every day | RESPIRATORY_TRACT | Status: DC

## 2015-09-18 NOTE — Patient Instructions (Signed)
Lasix 40 q am & 2 q pm x 5 days  - target wt 128 lbs Check BMET in 1 week OK for sildenafil instead of adcirca Keep O2 satn 88% & above

## 2015-09-18 NOTE — Assessment & Plan Note (Signed)
Keep O2 satn 88% & above

## 2015-09-18 NOTE — Assessment & Plan Note (Signed)
Lasix 40 q am & 2 q pm x 5 days  - target wt 128 lbs Check BMET in 1 week OK for sildenafil instead of adcirca

## 2015-09-18 NOTE — Assessment & Plan Note (Signed)
Breo/spiriva papers filled out

## 2015-09-18 NOTE — Progress Notes (Signed)
Subjective:    Patient ID: Luke Oh., male    DOB: July 10, 1941, 75 y.o.   MRN: [id number]  HPI  CP - Swayne  Cards - [facility] , felker, transplant  [place] - victor test  75 y.o. 68 PY ex smoker (quit '95) presents for FU of COPD.  PMH ischemic cardiomyopathy, status post heart transplant in 1996, on immunosuppressants,ascending aortic aneurysm followed at [facility], hypertension, hyperlipidemia, heparin-induced thrombocytopenia    09/18/2015  Chief Complaint  Patient presents with  . Follow-up    Breathing is not doing well, a lot of SOB, unable to do anything due to DOE, uses 6L at home with exertion o2 drops to high 70's low 80's.  CAT score: 23    Wife called - desatn on walking He has increasing dyspnea over the past few months requiring increase of oxygen up to 6 L on exertion. He had 2 cardiac stents placed after left heart cath. After right heart cath, he was diagnosed with pulmonary hypertension-initially placed on sildenafil- this was changed to the letairis and adcirca in 06/2015 [name] test) He feels that his pedal edema has worsened and overall dyspnea has worsened after starting this and wonders if he can go back to sildenafil  His imaging seemed to suggest interstitial lung disease in 05/1215 He has moderate to severe COPD and is maintained on BREO and Spiriva.  He is now more compliant with oxygen He  needs 3 L of continuous oxygen to keep oxygen levels above 90%. Walking patient knees 6 L of oxygen to keep his oxygen above 90%. He  has Lasix but says he rarely uses this. Weight is up about 6 pounds with lower extremity edema- he aims for 130 pounds  I reviewed his extensive [facility] records in care everywhere  Significant tests/ events  12/2013 Dyspnea attributed to [facility] Underwent extensive evaluation at [facility]  CT neg PE , VQ very low prob  RHC RA 2, PCWP 5, PA 44/21 ,mean 32, CI 1.7 --> given volume, With iNO improved CI from 1.6 to 2.3    Echo 03/2014 RVSP 61 (on  sildenafil), nml LV fn, LVH  05/2015 CT chest is very atypical. He has emphysema with apical and upper lobe predominence. He has increased reticular formation in the lungs which is not typical UIP but is most consistent with some degree of ILD  Spirometry 03/2012 fev 1 of 46%, FVC 68%, ratio 50  04/26/2012 ONO on 2.5 L O2 shows desatn for 3h 89 m (46% of night) - Increased to 3L O2  11/29/2012 Completed pulm rehab - FEV1 49%  PFTs 12/2013 - FVC 76%, FEV1 52% , ratio 53, DLCO 20%   Past Medical History  Diagnosis Date  . Hypertension   . Shortness of breath   . Hypercholesteremia   . HIT (heparin-induced thrombocytopenia) ([place])   . COPD (chronic obstructive pulmonary disease) ([place])   . AAA (abdominal aortic aneurysm) ([place])   . Arrhythmia has had episodes of fast heart rate     Review of Systems neg for any significant sore throat, dysphagia, itching, sneezing, nasal congestion or excess/ purulent secretions, fever, chills, sweats, unintended wt loss, pleuritic or exertional cp, hempoptysis, orthopnea pnd or change in chronic leg swelling.  Also denies presyncope, palpitations, heartburn, abdominal pain, nausea, vomiting, diarrhea or change in bowel or urinary habits, dysuria,hematuria, rash, arthralgias, visual complaints, headache, numbness weakness or ataxia.     Objective:   Physical Exam  Gen. Pleasant, thin, in no  distress, normal affect ENT - no lesions, no post nasal drip Neck: No JVD, no thyromegaly, no carotid bruits Lungs: no use of accessory muscles, no dullness to percussion, clear without rales or rhonchi  Cardiovascular: Rhythm regular, heart sounds  normal, no murmurs or gallops, 2+ peripheral edema Abdomen: soft and non-tender, no hepatosplenomegaly, BS normal. Musculoskeletal: No deformities, no cyanosis or clubbing Neuro:  alert, non focal        Assessment & Plan:

## 2015-09-24 NOTE — Telephone Encounter (Signed)
Left message for patient to call back for update as to whether or not he heard from Patient Assistance yet. Awaiting call back.

## 2015-09-25 ENCOUNTER — Telehealth: Payer: Self-pay | Admitting: Pulmonary Disease

## 2015-09-25 NOTE — Telephone Encounter (Signed)
Sorry computer shut down on me right in the midst of my typing , anyway, he was just calling to say thank you for the follow-up call and that he had spoken to insurance company and that he would let you know the out come.Hillery Hunter'

## 2015-09-26 ENCOUNTER — Telehealth: Payer: Self-pay | Admitting: Pulmonary Disease

## 2015-09-26 NOTE — Telephone Encounter (Signed)
LMTCB x1

## 2015-09-27 NOTE — Telephone Encounter (Signed)
Pt called back and made aware of below. He verbalized understanding and had no questions.

## 2015-09-27 NOTE — Telephone Encounter (Signed)
Received another fax from [employer] requesting that the forms be faxed to them again with another signature.  I am worried that the forms are going through on the fax, but the signature is blacked out.. The signature line on the form where Dr. Bari Mantis signature was is blacked out.   Will have Dr. Elsworth Soho sign the forms again, then MAIL to them instead of fax. Attempted to call patient to notified him, left message for him to call back.

## 2015-09-27 NOTE — Telephone Encounter (Signed)
LMTCB x2  

## 2015-09-30 NOTE — Telephone Encounter (Signed)
LMTCB and will close per triage protocol

## 2015-10-09 ENCOUNTER — Telehealth: Payer: Self-pay | Admitting: Pulmonary Disease

## 2015-10-09 NOTE — Telephone Encounter (Signed)
Spoke with [name] with [employer] ([employer]) which is a pt advocacy network to assist pts with pt assistance programs.   She is calling to check on the status of paperwork for Spiriva Handihaler and would like the below information regarding what paperwork was mailed and where it was mailed to.   Per [name], they have been working on this paperwork for a while and would like this taken care of ASAP. Would like paperwork also faxed to below # to expedite process.   Sharyn Lull, please advise.  Thank you.

## 2015-10-09 NOTE — Telephone Encounter (Signed)
error

## 2015-10-09 NOTE — Telephone Encounter (Signed)
[phone] Elon Alas, States they need this faxed back instead of mailed back Per Sharyn Lull this was mailed, angela wants to know if all paper work was mailed and if she mailed this to [facility] or the patient assistance program, this needs to be returned to [facility] NOT patient assistance [name] stresses that this needed to be handle ASAP due to patient waiting for almost a month   please faxed to [phone]   or   mailed to  [place]

## 2015-10-09 NOTE — Telephone Encounter (Signed)
These documents have been faxed 4 times to Fax number on form: [phone] and mailed 1 time to address on form [id number], [place] [address].   Attempted to call Luke Jacobson back and left message for her to call me back.  Not sure what more I can do to get this information sent as I have tried 5 times already. Awaiting call back to discuss with Luke Jacobson.

## 2015-10-09 NOTE — Telephone Encounter (Signed)
I have received fax and given it to [facility] for RA to sign since he is here today

## 2015-10-10 NOTE — Telephone Encounter (Signed)
Called and spoke with [name] at [facility].  She stated that she spoke with Hassan Rowan last night from [employer] and they confirmed that they received the paperwork, but stated that they did not receive a copy of the [place] letter.  Victoriano Lain that I had faxed everything that was sent to me.  I asked her for a phone number for the appeals dept so I could contact them.  She gave me the number that she calls [phone] Opt 6.  I called that number and spoke with Sheena at [employer].  Sheena advised me that she received the paperwork, but she did not see the appeals letter.  Asked Sheena where I could fax the paperwork so I can get confirmation that they received everything since this will be my 6th attempt at getting all of these documents to them.  She said to fax all the paperwork with appeals letter to [phone], use coversheet with 3 identifiers for patient.  She said that I could contact them at [phone] Opt 6 in 1 week to confirm receipt of fax.  She said that it takes up to 5 days to process all of their faxes.    Faxed paperwork, including Appeal Letter and coversheet as requested with Patient's name, DOB and SSN on cover as 3 identifiers.   Will call back in 1 week to confirm receipt and status. Hold in my box, I will close this encounter once I have completed it.

## 2015-10-16 NOTE — Telephone Encounter (Signed)
Spoke with Luke Jacobson she has reminder set up to check/follow up on this with [employer] on Thursday 10/17/15 and will update [place] as she gets further information. Luke Jacobson stated if all 14 pages that were sent on 09/12/14/17 and 10/09/15 were not completed and faxed back all at one time with proof of income to Patient Assistance then request will be denied again.    Pt is aware we should have approval/denial by the end of the week as Thursday 10/17/15 will be exactly 1 week since paperwork was faxed in. Will forward to [place] per her recent documentation.

## 2015-10-16 NOTE — Telephone Encounter (Signed)
Pt calling for update on this.Luke Jacobson

## 2015-10-18 NOTE — Telephone Encounter (Signed)
lmtcb for [name] with [employer] to follow up on patient assistance request.

## 2015-10-18 NOTE — Telephone Encounter (Signed)
Per [name] @ [facility], she has not heard from [employer] regarding pt assistance application so she is going to call them to follow up. She will call us back once she has spoken to them.

## 2015-10-18 NOTE — Telephone Encounter (Signed)
Luke Jacobson from [facility] cb [phone]

## 2015-10-18 NOTE — Telephone Encounter (Signed)
[place] said she just spoke with [employer] and they are processing this, it takes 5-7 days to process and they are on the 6th day.  She will call them back on Monday and will let us know what she finds out. CB [phone].

## 2015-10-23 NOTE — Telephone Encounter (Signed)
[name] returning call and can be reached @ the same #.Hillery Hunter

## 2015-10-23 NOTE — Telephone Encounter (Signed)
Luke Jacobson called and spoke with [place] at [facility].  She advised Luke Jacobson that they have no paperwork.  Luke Jacobson advised her that the paperwork has been submitted through our office 6 times via fax, 1 time through mail and that their office has submitted the paperwork 2 times already.  She requested to speak with a supervisor at [employer] [facility], but [place] advised her that there was no one available.  Not sure what to do at this point.  Dr. Elsworth Soho, should we switch patient to another inhaler that is not through [employer]?  Patient is currently getting Breo through Patient Assistance.  Luke Jacobson says that she can process patient assistance for patient with any other company.  We have been trying to get this approved since July 23, 2015.    Dr. Elsworth Soho, please advise.

## 2015-10-23 NOTE — Telephone Encounter (Signed)
Called and spoke with [name], she said that she is scheduled to contact [employer] today to get update on this patient.  She said that she would contact us after lunch today (central time). Awaiting call back from [place] with update.

## 2015-10-24 NOTE — Telephone Encounter (Signed)
Pl let pt know & let him decide We have done our part

## 2015-10-24 NOTE — Telephone Encounter (Addendum)
Left message for patient to call back. Can stay on Breo and Spiriva, or can just go on Anoro.  (Anoro is a combination of Spiriva/Breo per RA) Discuss with patient if he wants to switch to Anoro, if so, we can send paperwork for Anoro to be processed through patient assistance which is not through [employer], it is through [place]. Will contact [name] once I have spoken with patient.

## 2015-10-28 NOTE — Telephone Encounter (Signed)
Pt returned call. Informed him of the options with his medication, pt has decided to change to Anoro. Pt states he just refilled the Breo and Spiriva so will be covered for a few weeks with his medication. Pt also stated he will retreive the pt assistance forms online for the Anoro and call before sending them to the office so we can anticipate receiving the forms.   Called and informed [name] of the change in medication. [name] states they will contact the pt to see if he is interested with pt assistance through [facility].  Will forward to [place] as [place].

## 2015-10-29 NOTE — Telephone Encounter (Signed)
Katie-[facility] is needing the Spirivia needs new rx faxed [phone]       Phone # [phone] Ask for a Pharmacy

## 2015-10-30 NOTE — Telephone Encounter (Signed)
Per message below, it looks like pt has stopped breo and spiriva and is switching to anoro. Called number below-(this number is to [facility] patient assistance, not [facility]), made representative aware that pt is no longer using spiriva at this time.  Pt will still be enrolled in program through the end of the year, but if he decides to go back on Spiriva he will need a new rx sent to the below verified fax #.  Forwarding back to [place] to follow up on Anoro pt assistance

## 2015-11-01 NOTE — Telephone Encounter (Signed)
Spoke with pt, states that he was having trouble printing Anoro paperwork from his home computer, so he called [place] and these have been mailed to him.  Pt states he expects to get these early next week, and will mail these forms to our office at [facility] attention.    Forwarding to [place] to look out for.

## 2015-11-01 NOTE — Telephone Encounter (Signed)
Pt returning call.Luke Jacobson'

## 2015-11-01 NOTE — Telephone Encounter (Signed)
Called and left detailed message on patient's voicemail advising him to call me back to let me know if he has sent in his Patient assistance forms for Anoro so I can get this expedited for him.  Awaiting call back from patient.

## 2015-11-05 NOTE — Telephone Encounter (Signed)
Called and spoke with patient, he states that he was paying for [employer]'s services ($50 per month) and he has cancelled this service, so any calls from [facility] [name]) we are not authorized to talk to them any longer.  Patient has been notified that we received letter from [place] approving his patient assistance for [place].  Gave information to patient regarding contact information to have Spiriva delivered.  Will place letter in scan folder to be scanned into patient's chart.  Nothing further needed.

## 2015-11-07 ENCOUNTER — Ambulatory Visit: Payer: Medicare Other | Admitting: Pulmonary Disease

## 2015-11-10 ENCOUNTER — Other Ambulatory Visit: Payer: Self-pay | Admitting: Pulmonary Disease

## 2015-11-11 ENCOUNTER — Other Ambulatory Visit: Payer: Self-pay | Admitting: Pulmonary Disease

## 2015-11-12 ENCOUNTER — Telehealth: Payer: Self-pay | Admitting: Pulmonary Disease

## 2015-11-12 MED ORDER — FLUTICASONE FUROATE-VILANTEROL 100-25 MCG/INH IN AEPB: 1.0000 | INHALATION_SPRAY | Freq: Every day | RESPIRATORY_TRACT | Status: DC

## 2015-11-12 NOTE — Telephone Encounter (Signed)
Spoke with pt, states he would like the letter we received from [facility] cares- would like this mailed to him- verified address on file.  This letter has not yet been scanned into pt's chart.  Forwarding to [place] at her request to have this mailed to pt once it is scanned into pt's chart.  Pt also requesting a 30 day supply of breo be sent to [facility] until his shipment of spiriva is sent from [facility] cares.  This has been sent.

## 2015-11-14 NOTE — Telephone Encounter (Signed)
Sharyn Lull please advise if this has been completed

## 2015-11-18 NOTE — Telephone Encounter (Signed)
Copy of letter from [facility] has been mailed to patient. Nothing further needed.

## 2015-12-05 ENCOUNTER — Emergency Department (HOSPITAL_COMMUNITY): Payer: Medicare Other

## 2015-12-05 ENCOUNTER — Encounter (HOSPITAL_COMMUNITY): Payer: Self-pay | Admitting: *Deleted

## 2015-12-05 ENCOUNTER — Inpatient Hospital Stay (HOSPITAL_COMMUNITY)
Admission: EM | Admit: 2015-12-05 | Discharge: 2015-12-08 | DRG: 190 | Disposition: A | Discharge status: Home Health | Payer: Medicare Other | Attending: Internal Medicine | Admitting: Internal Medicine

## 2015-12-05 DIAGNOSIS — I272 Other secondary pulmonary hypertension: Secondary | ICD-10-CM | POA: Diagnosis present

## 2015-12-05 DIAGNOSIS — Z888 Allergy status to other drugs, medicaments and biological substances status: Secondary | ICD-10-CM

## 2015-12-05 DIAGNOSIS — Z7902 Long term (current) use of antithrombotics/antiplatelets: Secondary | ICD-10-CM

## 2015-12-05 DIAGNOSIS — J440 Chronic obstructive pulmonary disease with acute lower respiratory infection: Secondary | ICD-10-CM | POA: Diagnosis not present

## 2015-12-05 DIAGNOSIS — J069 Acute upper respiratory infection, unspecified: Secondary | ICD-10-CM

## 2015-12-05 DIAGNOSIS — R0902 Hypoxemia: Secondary | ICD-10-CM | POA: Diagnosis not present

## 2015-12-05 DIAGNOSIS — J387 Other diseases of larynx: Secondary | ICD-10-CM | POA: Diagnosis present

## 2015-12-05 DIAGNOSIS — Z9981 Dependence on supplemental oxygen: Secondary | ICD-10-CM

## 2015-12-05 DIAGNOSIS — J029 Acute pharyngitis, unspecified: Secondary | ICD-10-CM

## 2015-12-05 DIAGNOSIS — R0602 Shortness of breath: Secondary | ICD-10-CM | POA: Diagnosis not present

## 2015-12-05 DIAGNOSIS — J0430 Supraglottitis, unspecified, without obstruction: Secondary | ICD-10-CM

## 2015-12-05 DIAGNOSIS — J189 Pneumonia, unspecified organism: Secondary | ICD-10-CM | POA: Diagnosis not present

## 2015-12-05 DIAGNOSIS — J9610 Chronic respiratory failure, unspecified whether with hypoxia or hypercapnia: Secondary | ICD-10-CM | POA: Diagnosis present

## 2015-12-05 DIAGNOSIS — Z8261 Family history of arthritis: Secondary | ICD-10-CM

## 2015-12-05 DIAGNOSIS — I129 Hypertensive chronic kidney disease with stage 1 through stage 4 chronic kidney disease, or unspecified chronic kidney disease: Secondary | ICD-10-CM | POA: Diagnosis present

## 2015-12-05 DIAGNOSIS — J9611 Chronic respiratory failure with hypoxia: Secondary | ICD-10-CM | POA: Diagnosis present

## 2015-12-05 DIAGNOSIS — Z941 Heart transplant status: Secondary | ICD-10-CM | POA: Diagnosis not present

## 2015-12-05 DIAGNOSIS — I25811 Atherosclerosis of native coronary artery of transplanted heart without angina pectoris: Secondary | ICD-10-CM | POA: Diagnosis present

## 2015-12-05 DIAGNOSIS — E7800 Pure hypercholesterolemia, unspecified: Secondary | ICD-10-CM | POA: Diagnosis present

## 2015-12-05 DIAGNOSIS — Z955 Presence of coronary angioplasty implant and graft: Secondary | ICD-10-CM

## 2015-12-05 DIAGNOSIS — N179 Acute kidney failure, unspecified: Secondary | ICD-10-CM

## 2015-12-05 DIAGNOSIS — Z7982 Long term (current) use of aspirin: Secondary | ICD-10-CM

## 2015-12-05 DIAGNOSIS — I2510 Atherosclerotic heart disease of native coronary artery without angina pectoris: Secondary | ICD-10-CM | POA: Diagnosis present

## 2015-12-05 DIAGNOSIS — Z79899 Other long term (current) drug therapy: Secondary | ICD-10-CM

## 2015-12-05 DIAGNOSIS — N183 Chronic kidney disease, stage 3 (moderate): Secondary | ICD-10-CM | POA: Diagnosis present

## 2015-12-05 DIAGNOSIS — I10 Essential (primary) hypertension: Secondary | ICD-10-CM | POA: Diagnosis present

## 2015-12-05 DIAGNOSIS — Z809 Family history of malignant neoplasm, unspecified: Secondary | ICD-10-CM

## 2015-12-05 DIAGNOSIS — Z8249 Family history of ischemic heart disease and other diseases of the circulatory system: Secondary | ICD-10-CM

## 2015-12-05 DIAGNOSIS — I714 Abdominal aortic aneurysm, without rupture: Secondary | ICD-10-CM | POA: Diagnosis present

## 2015-12-05 LAB — CBC WITH DIFFERENTIAL/PLATELET
BASOS PCT: 0 %
Basophils Absolute: 0 10*3/uL (ref 0.0–0.1)
EOS ABS: 0 10*3/uL (ref 0.0–0.7)
Eosinophils Relative: 0 %
HEMATOCRIT: 41 % (ref 39.0–52.0)
Hemoglobin: 13.8 g/dL (ref 13.0–17.0)
Lymphocytes Relative: 10 %
Lymphs Abs: 0.7 10*3/uL (ref 0.7–4.0)
MCH: 30.4 pg (ref 26.0–34.0)
MCHC: 33.7 g/dL (ref 30.0–36.0)
MCV: 90.3 fL (ref 78.0–100.0)
MONO ABS: 0.8 10*3/uL (ref 0.1–1.0)
MONOS PCT: 11 %
Neutro Abs: 5.7 10*3/uL (ref 1.7–7.7)
Neutrophils Relative %: 79 %
Platelets: 103 10*3/uL — ABNORMAL LOW (ref 150–400)
RBC: 4.54 MIL/uL (ref 4.22–5.81)
RDW: 14.1 % (ref 11.5–15.5)
WBC: 7.2 10*3/uL (ref 4.0–10.5)

## 2015-12-05 LAB — BASIC METABOLIC PANEL
Anion gap: 7 (ref 5–15)
BUN: 37 mg/dL — ABNORMAL HIGH (ref 6–20)
CALCIUM: 8.9 mg/dL (ref 8.9–10.3)
CO2: 26 mmol/L (ref 22–32)
CREATININE: 1.49 mg/dL — AB (ref 0.61–1.24)
Chloride: 104 mmol/L (ref 101–111)
GFR calc non Af Amer: 45 mL/min — ABNORMAL LOW (ref >60)
GFR, EST AFRICAN AMERICAN: 52 mL/min — AB (ref >60)
Glucose, Bld: 111 mg/dL — ABNORMAL HIGH (ref 65–99)
Potassium: 4.2 mmol/L (ref 3.5–5.1)
SODIUM: 137 mmol/L (ref 135–145)

## 2015-12-05 LAB — URINE MICROSCOPIC-ADD ON

## 2015-12-05 LAB — URINALYSIS, ROUTINE W REFLEX MICROSCOPIC
BILIRUBIN URINE: NEGATIVE
Glucose, UA: NEGATIVE mg/dL
HGB URINE DIPSTICK: NEGATIVE
KETONES UR: NEGATIVE mg/dL
Leukocytes, UA: NEGATIVE
NITRITE: NEGATIVE
PH: 6 (ref 5.0–8.0)
Protein, ur: 30 mg/dL — AB
Specific Gravity, Urine: 1.024 (ref 1.005–1.030)

## 2015-12-05 LAB — I-STAT TROPONIN, ED: TROPONIN I, POC: 0.02 ng/mL (ref 0.00–0.08)

## 2015-12-05 LAB — LACTIC ACID, PLASMA
LACTIC ACID, VENOUS: 0.9 mmol/L (ref 0.5–2.0)
Lactic Acid, Venous: 0.9 mmol/L (ref 0.5–2.0)

## 2015-12-05 LAB — BRAIN NATRIURETIC PEPTIDE: B Natriuretic Peptide: 195.1 pg/mL — ABNORMAL HIGH (ref 0.0–100.0)

## 2015-12-05 MED ORDER — CYCLOSPORINE MODIFIED (NEORAL) 25 MG PO CAPS
75.0000 mg | ORAL_CAPSULE | Freq: Two times a day (BID) | ORAL | Status: DC
  Administered 2015-12-05 – 2015-12-08 (×6): 75 mg via ORAL
  Filled 2015-12-05 (×7): qty 3

## 2015-12-05 MED ORDER — IOPAMIDOL (ISOVUE-370) INJECTION 76%: 75.0000 mL | Freq: Once | INTRAVENOUS | Status: DC | PRN

## 2015-12-05 MED ORDER — LEVOFLOXACIN IN D5W 750 MG/150ML IV SOLN: 750.0000 mg | INTRAVENOUS | Status: DC

## 2015-12-05 MED ORDER — CLOPIDOGREL BISULFATE 75 MG PO TABS
75.0000 mg | ORAL_TABLET | Freq: Every day | ORAL | Status: DC
  Administered 2015-12-06 – 2015-12-08 (×3): 75 mg via ORAL
  Filled 2015-12-05 (×3): qty 1

## 2015-12-05 MED ORDER — SODIUM CHLORIDE 0.9 % IV BOLUS (SEPSIS)
500.0000 mL | Freq: Once | INTRAVENOUS | Status: AC
  Administered 2015-12-05: 500 mL via INTRAVENOUS

## 2015-12-05 MED ORDER — SIMVASTATIN 40 MG PO TABS
40.0000 mg | ORAL_TABLET | Freq: Every day | ORAL | Status: DC
Start: 2015-12-06 — End: 2015-12-08
  Administered 2015-12-06 – 2015-12-08 (×3): 40 mg via ORAL
  Filled 2015-12-05 (×3): qty 1

## 2015-12-05 MED ORDER — FLUTICASONE FUROATE-VILANTEROL 100-25 MCG/INH IN AEPB
1.0000 | INHALATION_SPRAY | Freq: Every day | RESPIRATORY_TRACT | Status: DC
  Administered 2015-12-06 – 2015-12-08 (×3): 1 via RESPIRATORY_TRACT
  Filled 2015-12-05: qty 28

## 2015-12-05 MED ORDER — ALBUTEROL SULFATE (2.5 MG/3ML) 0.083% IN NEBU
2.5000 mg | INHALATION_SOLUTION | RESPIRATORY_TRACT | Status: DC | PRN
  Administered 2015-12-05: 2.5 mg via RESPIRATORY_TRACT
  Filled 2015-12-05: qty 3

## 2015-12-05 MED ORDER — TADALAFIL (PAH) 20 MG PO TABS: 40.0000 mg | ORAL_TABLET | Freq: Every day | ORAL | Status: DC

## 2015-12-05 MED ORDER — CLOPIDOGREL BISULFATE 75 MG PO TABS: 75.0000 mg | ORAL_TABLET | Freq: Every day | ORAL | Status: DC

## 2015-12-05 MED ORDER — ACETAMINOPHEN 325 MG PO TABS
650.0000 mg | ORAL_TABLET | Freq: Four times a day (QID) | ORAL | Status: DC | PRN
  Administered 2015-12-05 – 2015-12-08 (×3): 650 mg via ORAL
  Filled 2015-12-05 (×3): qty 2

## 2015-12-05 MED ORDER — GUAIFENESIN ER 600 MG PO TB12
600.0000 mg | ORAL_TABLET | Freq: Two times a day (BID) | ORAL | Status: DC
  Administered 2015-12-05 – 2015-12-08 (×6): 600 mg via ORAL
  Filled 2015-12-05 (×6): qty 1

## 2015-12-05 MED ORDER — ASPIRIN EC 81 MG PO TBEC
81.0000 mg | DELAYED_RELEASE_TABLET | Freq: Every day | ORAL | Status: DC
  Administered 2015-12-05 – 2015-12-07 (×3): 81 mg via ORAL
  Filled 2015-12-05 (×3): qty 1

## 2015-12-05 MED ORDER — ADULT MULTIVITAMIN W/MINERALS CH
1.0000 | ORAL_TABLET | Freq: Every day | ORAL | Status: DC
  Administered 2015-12-06 – 2015-12-08 (×3): 1 via ORAL
  Filled 2015-12-05 (×6): qty 1

## 2015-12-05 MED ORDER — TEMAZEPAM 15 MG PO CAPS
30.0000 mg | ORAL_CAPSULE | Freq: Every evening | ORAL | Status: DC | PRN
  Administered 2015-12-05 – 2015-12-07 (×3): 30 mg via ORAL
  Filled 2015-12-05 (×3): qty 2

## 2015-12-05 MED ORDER — LORATADINE 10 MG PO TABS
10.0000 mg | ORAL_TABLET | Freq: Every day | ORAL | Status: DC
  Administered 2015-12-06 – 2015-12-08 (×3): 10 mg via ORAL
  Filled 2015-12-05 (×3): qty 1

## 2015-12-05 MED ORDER — MAGNESIUM OXIDE 400 (241.3 MG) MG PO TABS
400.0000 mg | ORAL_TABLET | Freq: Every day | ORAL | Status: DC
  Administered 2015-12-06 – 2015-12-08 (×12): 400 mg via ORAL
  Filled 2015-12-05 (×12): qty 1

## 2015-12-05 MED ORDER — PANTOPRAZOLE SODIUM 40 MG PO TBEC
40.0000 mg | DELAYED_RELEASE_TABLET | Freq: Every day | ORAL | Status: DC
  Administered 2015-12-06 – 2015-12-08 (×3): 40 mg via ORAL
  Filled 2015-12-05 (×3): qty 1

## 2015-12-05 MED ORDER — MYCOPHENOLATE MOFETIL 250 MG PO CAPS
1500.0000 mg | ORAL_CAPSULE | Freq: Two times a day (BID) | ORAL | Status: DC
  Administered 2015-12-05 – 2015-12-08 (×6): 1500 mg via ORAL
  Filled 2015-12-05 (×7): qty 6

## 2015-12-05 MED ORDER — SODIUM CHLORIDE 0.9 % IV SOLN
INTRAVENOUS | Status: AC
  Administered 2015-12-05: 23:00:00 via INTRAVENOUS

## 2015-12-05 MED ORDER — LEVOFLOXACIN IN D5W 750 MG/150ML IV SOLN
750.0000 mg | Freq: Once | INTRAVENOUS | Status: AC
  Administered 2015-12-05: 750 mg via INTRAVENOUS
  Filled 2015-12-05: qty 150

## 2015-12-05 MED ORDER — IPRATROPIUM-ALBUTEROL 0.5-2.5 (3) MG/3ML IN SOLN
3.0000 mL | Freq: Four times a day (QID) | RESPIRATORY_TRACT | Status: DC
  Administered 2015-12-06 – 2015-12-08 (×10): 3 mL via RESPIRATORY_TRACT
  Filled 2015-12-05 (×10): qty 3

## 2015-12-05 NOTE — ED Notes (Signed)
Called respiratory spoke with daniel, will come evaluate. spo2 in low 90's at 7 liters 02.

## 2015-12-05 NOTE — ED Notes (Signed)
Pt is aware we need a urine sample. Pt was provided a urinal and will notify when he is able to void.

## 2015-12-05 NOTE — ED Notes (Signed)
Pt states he has had neck pain, headache and difficulty swallowing x5 days. Strep test negative. Saw PCP for this. Pt ran out of O2 today (on home 6L [place]) and was hypoxic at PCP. PCP is worried about sore throat. Sent for diagnostic testing. Alert and oriented.

## 2015-12-05 NOTE — ED Notes (Signed)
Calling respiratory for evaluation.

## 2015-12-05 NOTE — H&P (Signed)
History and Physical    Luke Jacobson. CZY:[id number] DOB: Mar 19, 1941 DOA: 12/05/2015  PCP: Gara Kroner, MD  Heart Transplant team at [facility]  Patient coming from: Home  Chief Complaint: Increased sputum production, sore throat, decreased PO intake, recurrent fever  HPI: Luke Jacobson. is a 75 y.o. gentleman with a history of heart transplant in 1996 on immunosuppressant therapy, COPD on oxygen 6L [place] at baseline, HTN, dyslipidemia, and HIT who reports a one week history of recurrent fever, sore throat, cough productive of white/tan sputum (no hemoptysis), and malaise with decreased PO intake.  He was evaluated in the outpatient setting and given a prescription for azithromycin.  Symptoms have persisted.  He was re-evaluated today and found to be hypoxic, O2 sats 85-86%, on 6L [place].  He was referred to the ED for further evaluation.  ED Course: O2 sats improved on 7L [place].  Chest xray shows interstitial prominence, differential includes edema, atypical infection, or interstitial lung disease.  He does not appear septic, but he has increased work of breathing with increased O2 requirement.  The patient has received IV levaquin.  Hospitalist asked to admit.  Review of Systems: No recent travel  No tick bites.  No sick contacts.  Light-headedness but no syncope.  No dysuria.  Intermittent diarrhea.  No nausea or vomiting.  As per HPI otherwise 10 point review of systems negative.    Past Medical History  Diagnosis Date  . Hypertension   . Shortness of breath   . Hypercholesteremia   . HIT (heparin-induced thrombocytopenia) ([place])   . COPD (chronic obstructive pulmonary disease) ([place])   . AAA (abdominal aortic aneurysm) ([place])   . Arrhythmia has had episodes of fast heart rate    Past Surgical History  Procedure Laterality Date  . Heart transplant    . Heart stents  Sept 2016    2 stents     reports that he quit smoking about 21 years ago. His smoking use included Cigarettes. He  has a 30 pack-year smoking history. He has never used smokeless tobacco. He reports that he drinks alcohol. He reports that he does not use illicit drugs.  He is married.  He has one adult child.  He does not need cane or walker for ambulation at baseline.  Allergies  Allergen Reactions  . Ambrisentan Other (See Comments), Itching and Swelling  . Heparin     Made white blood count go down. HIT    Family History  Problem Relation Age of Onset  . Heart attack Father   . Heart disease Mother   . Rheum arthritis Mother   . Rheum arthritis Paternal Grandfather   . Cancer Paternal Grandfather     Prior to Admission medications   Medication Sig Start Date End Date Taking? Authorizing Provider  acetaminophen (TYLENOL) 500 MG tablet Take 500-1,000 mg by mouth daily.    Yes Historical Provider, MD  ADCIRCA 20 MG TABS Take 40 mg by mouth daily.    Yes Historical Provider, MD  albuterol (PROAIR HFA) 108 (90 Base) MCG/ACT inhaler Inhale 2 puffs into the lungs every 6 (six) hours as needed. 09/18/15  Yes Rigoberto Noel, MD  aspirin EC 81 MG tablet Take 81 mg by mouth at bedtime.    Yes Historical Provider, MD  azithromycin (ZITHROMAX) 250 MG tablet Take 250-500 mg by mouth daily. Take 546ms on day 1 then 2580m daily for 4 days 12/02/15  Yes Historical Provider, MD  clopidogrel (PLAVIX) 75  MG tablet Take 75 mg by mouth daily. 10/17/15  Yes Historical Provider, MD  Coenzyme Q10 (CO Q10 PO) Take 500 mg by mouth daily.   Yes Historical Provider, MD  cycloSPORINE modified (NEORAL) 25 MG capsule Take 75 mg by mouth 2 (two) times daily.    Yes Historical Provider, MD  fexofenadine (ALLEGRA) 180 MG tablet Take 180 mg by mouth daily.   Yes Historical Provider, MD  fluticasone furoate-vilanterol (BREO ELLIPTA) 100-25 MCG/INH AEPB Inhale 1 puff into the lungs daily. 11/12/15  Yes Rigoberto Noel, MD  furosemide (LASIX) 20 MG tablet Take 40 mg by mouth daily as needed for fluid or edema.    Yes Historical Provider, MD    losartan (COZAAR) 50 MG tablet Take 50 mg by mouth daily with supper. 12/02/15  Yes Historical Provider, MD  magnesium oxide (MAG-OX) 400 MG tablet Take 400 mg by mouth 5 (five) times daily.    Yes Historical Provider, MD  Multiple Vitamin (MULTIVITAMIN) tablet Take 1 tablet by mouth daily.   Yes Historical Provider, MD  mycophenolate (CELLCEPT) 500 MG tablet Take 1,500 mg by mouth 2 (two) times daily.   Yes Historical Provider, MD  Omega-3 Fatty Acids (FISH OIL) 1000 MG CAPS Take 1 capsule by mouth every evening.   Yes Historical Provider, MD  omeprazole (PRILOSEC) 40 MG capsule Take 40 mg by mouth daily.   Yes Historical Provider, MD  simvastatin (ZOCOR) 40 MG tablet Take 40 mg by mouth daily.   Yes Historical Provider, MD  temazepam (RESTORIL) 15 MG capsule Take 30 mg by mouth at bedtime as needed. For sleep   Yes Historical Provider, MD  tiotropium (SPIRIVA HANDIHALER) 18 MCG inhalation capsule INHALE CONTENTS OF 1 CAPSULE VIA HANDIHALER ONCE DAILY AS DIRECTED 07/16/15  Yes Rigoberto Noel, MD    Physical Exam: Filed Vitals:   12/05/15 1723 12/05/15 1724 12/05/15 1915 12/05/15 2029  BP:   142/81   Pulse: 80  86 81  Temp: 100.2 F (37.9 C)  101.1 F (38.4 C)   TempSrc: Oral  Oral   Resp: _0 Height:  _1  (1.702 m)    Weight:  57.153 kg (126 lb)    SpO2:   91% 93%      Constitutional: NAD, calm, ill-appearing with increased work of breathing, pursed lips Filed Vitals:   12/05/15 1723 12/05/15 1724 12/05/15 1915 12/05/15 2029  BP:   142/81   Pulse: 80  86 81  Temp: 100.2 F (37.9 C)  101.1 F (38.4 C)   TempSrc: Oral  Oral   Resp: _2 Height:  _3  (1.702 m)    Weight:  57.153 kg (126 lb)    SpO2:   91% 93%   Eyes: PERRL, lids and conjunctivae normal ENMT: Mucous membranes are slightly dry. Posterior pharynx clear of any exudate or lesions. Neck: normal, supple, no masses, no thyromegaly.  Right sided cervical LAD.  No supraclavicular lymph nodes  palpated. Respiratory: Diffuse intermittent ronchi.  No wheezing.  No crackles.  Increased work of breathing but no accessory muscle use.  Cardiovascular: Regular rate and rhythm, no murmurs / rubs / gallops. No extremity edema. 2+ pedal pulses.  Abdomen: no tenderness, no masses palpated. No hepatosplenomegaly. Bowel sounds positive.  Musculoskeletal: no clubbing / cyanosis. No joint deformity upper and lower extremities. Good ROM, no contractures. Normal muscle tone.  Skin: no rashes, lesions, ulcers. No induration Neurologic: CN 2-12 grossly intact. Sensation  intact, Strength 5/5 in all 4.  Psychiatric: Normal judgment and insight. Alert and oriented x 3. Normal mood.    Labs on Admission: I have personally reviewed following labs and imaging studies  CBC:  Recent Labs Lab 12/05/15 1725  WBC 7.2  NEUTROABS 5.7  HGB 13.8  HCT 41.0  MCV 90.3  PLT 034*   Basic Metabolic Panel:  Recent Labs Lab 12/05/15 1725  NA 137  K 4.2  CL 104  CO2 26  GLUCOSE 111*  BUN 37*  CREATININE 1.49*  CALCIUM 8.9   Urine analysis:    Component Value Date/Time   COLORURINE YELLOW 12/05/2015 1933   APPEARANCEUR CLEAR 12/05/2015 1933   LABSPEC 1.024 12/05/2015 1933   PHURINE 6.0 12/05/2015 1933   GLUCOSEU NEGATIVE 12/05/2015 1933   HGBUR NEGATIVE 12/05/2015 [place] NEGATIVE 12/05/2015 [place] NEGATIVE 12/05/2015 1933   PROTEINUR 30* 12/05/2015 1933   UROBILINOGEN 1.0 03/10/2012 1603   NITRITE NEGATIVE 12/05/2015 1933   LEUKOCYTESUR NEGATIVE 12/05/2015 1933   Sepsis Labs:  First lactic acid level normal  Radiological Exams on Admission: Ct Soft Tissue Neck W Contrast  12/05/2015  CLINICAL DATA:  Neck pain, headache, and difficulty swallowing for 5 days. Fever. Assess for retropharyngeal abscess. EXAM: CT NECK WITH CONTRAST TECHNIQUE: Multidetector CT imaging of the neck was performed using the standard protocol following the bolus administration of intravenous  contrast. CONTRAST:  75 mL Isovue 300 COMPARISON:  None. FINDINGS: Pharynx and larynx: Examination limited by respiratory motion artifact and metallic dental streak artifact. No tonsillar asymmetry, pharyngeal mass, or retropharyngeal fluid collection identified. Appearance of mild supraglottic/hypopharyngeal soft tissue thickening diffusely with slight thickening of the epiglottis. Airway is widely patent. Salivary glands: Salivary and parotid glands are unremarkable within limitations of artifact. Thyroid: Unremarkable. Lymph nodes: Scattered subcentimeter anterior cervical lymph nodes bilaterally including a 9 mm left level IV lymph node. Vascular: Major vascular structures of the neck appear patent. Mild right and moderate left carotid bifurcation atherosclerosis without evidence of significant stenosis. Limited intracranial: Unremarkable. Visualized orbits: Suspected bilateral cataract extraction, incompletely visualized. Mastoids and visualized paranasal sinuses: Clear. Skeleton: Advanced multilevel disc degeneration in the lower cervical spine. Upper chest: Visualized lung apices are grossly clear with evidence of emphysema. IMPRESSION: 1. Motion degraded examination. No evidence of retropharyngeal abscess. 2. Suggestion of mild supraglottic/hypopharyngeal swelling which may reflect upper respiratory infection/ supraglottitis. 3. Scattered subcentimeter cervical lymph nodes, nonspecific but most likely reactive. Electronically Signed   By: Logan Bores M.D.   On: 12/05/2015 19:11   Dg Chest Port 1 View  12/05/2015  CLINICAL DATA:  Neck pain with headache and difficulty swallowing for 5 days. Fever and cough. History of heart transplant. EXAM: PORTABLE CHEST 1 VIEW COMPARISON:  01/17/2015 and 06/13/2013. FINDINGS: 1654 hours. The heart size and mediastinal contours are stable status post median sternotomy. There is aortic atherosclerosis. Compared with the prior studies, there is increased interstitial  prominence throughout the lungs. Blunting of both costophrenic angles is unchanged. There is no confluent airspace opacity or pneumothorax. Fractures of the upper sternotomy wires is unchanged. No acute osseous findings are seen. IMPRESSION: Progressive interstitial prominence in the lungs may reflect edema, atypical infection or developing interstitial lung disease. Short term radiographic follow up recommended. If persistent, high-resolution chest CT may be helpful. Electronically Signed   By: Richardean Sale M.D.   On: 12/05/2015 17:11    EKG: Independently reviewed. NSR, no acute ST segment changes.  Assessment/Plan Principal Problem:  Hypoxia Active Problems:   CAD (coronary artery disease)   H/O heart transplant ([place])   Hypertension   Pulmonary hypertension (H[facility])   CAP (community acquired pneumonia)   URI (upper respiratory infection)   AKI (acute kidney injury) ([place])  Acute hypoxia with URI, early CAP, supraglottitis on CT neck --Admit for observation --Empiric IV levaquin --Blood cultures pending --Will check respiratory virus panel, urine legionella and streptococcal antigens --Respiratory therapy, wean O2 to baseline of 6L as tolerated.  Mucinex BID, IS, flutter valve.  Scheduled nebulizer treatments. --Sputum culture if the patient can give a sample --Persistent complaints of sore throat, difficulty swallowing.  May need to consider ENT consult if no improvement with initial therapies.  Steroids deferred for now.  History of heart transplant --Continue cyclosporine, cellcept  Mild AKI --Hydrate with NS, hold ARB and lasix for now.  Repeat BMP in the AM.  History of CAD --Continue aspirin, plavix, cellcept  DVT prophylaxis: SCDs (history of HIT) Code Status: FULL Family Communication: Patient alone at time of admission (wife had already gone home) Disposition Plan: Anticipate discharge to home when ready Consults called: None Admission status: Observation,  telemetry   Eber Jones MD [facility]  If 7PM-7AM, please contact night-coverage [email or web address] Password [email or web address]  12/05/2015, 8:30 PM

## 2015-12-05 NOTE — ED Provider Notes (Signed)
CSN: [id number]     Arrival date & time 12/05/15  1629 History   First MD Initiated Contact with Patient 12/05/15 1631     Chief Complaint  Patient presents with  . Shortness of Breath    Luke Jacobson. is a 75 y.o. male with a history of a previous heart transplant in 1997 at [facility], COPD, AAA, and hypertension who presents to the emergency department from urgent care complaining of fevers and a sore throat ongoing for the past week. He was seen in urgent care last week and had negative rapid strep test. He is been on 2 rounds of antibiotics. He's been on amoxicillin and then starting 2 days ago azithromycin. He also had a second negative strep test. He said no improvement of his symptoms. He is continued to have fevers. He reports maximum temperature 101.4 yesterday. She reports pain with swallowing. No changes to his voice. He also complains of increased shortness of breath. He is on 6 L via nasal cannula at home. He reports increased productive cough. [facility] reports the patient had oxygen saturation around 86% on his 6 L. He denies chest pain, palpitations, leg swelling, drooling, changes to his voice or rashes.   The history is provided by the patient. No language interpreter was used.    Past Medical History  Diagnosis Date  . Hypertension   . Shortness of breath   . Hypercholesteremia   . HIT (heparin-induced thrombocytopenia) ([place])   . COPD (chronic obstructive pulmonary disease) ([place])   . AAA (abdominal aortic aneurysm) ([place])   . Arrhythmia has had episodes of fast heart rate   Past Surgical History  Procedure Laterality Date  . Heart transplant    . Heart stents  Sept 2016    2 stents   Family History  Problem Relation Age of Onset  . Heart attack Father   . Heart disease Mother   . Rheum arthritis Mother   . Rheum arthritis Paternal Grandfather   . Cancer Paternal Grandfather    Social History  Substance Use Topics  . Smoking status: Former Smoker -- 1.00  packs/day for 30 years    Types: Cigarettes    Quit date: 03/10/1994  . Smokeless tobacco: Never Used  . Alcohol Use: Yes     Comment: social    Review of Systems  Constitutional: Positive for fever and fatigue.  HENT: Positive for sore throat. Negative for congestion, ear pain, rhinorrhea and voice change.   Eyes: Negative for visual disturbance.  Respiratory: Positive for cough and shortness of breath. Negative for chest tightness and wheezing.   Cardiovascular: Negative for chest pain, palpitations and leg swelling.  Gastrointestinal: Negative for nausea, vomiting, abdominal pain and diarrhea.  Genitourinary: Negative for dysuria and difficulty urinating.  Musculoskeletal: Negative for back pain and neck pain.  Skin: Negative for rash.  Neurological: Positive for headaches.      Allergies  Ambrisentan and Heparin  Home Medications   Prior to Admission medications   Medication Sig Start Date End Date Taking? Authorizing Provider  acetaminophen (TYLENOL) 500 MG tablet Take 500-1,000 mg by mouth daily.    Yes Historical Provider, MD  ADCIRCA 20 MG TABS Take 40 mg by mouth daily.    Yes Historical Provider, MD  albuterol (PROAIR HFA) 108 (90 Base) MCG/ACT inhaler Inhale 2 puffs into the lungs every 6 (six) hours as needed. 09/18/15  Yes Rigoberto Noel, MD  aspirin EC 81 MG tablet Take 81 mg by mouth  at bedtime.    Yes Historical Provider, MD  azithromycin (ZITHROMAX) 250 MG tablet Take 250-500 mg by mouth daily. Take 548ms on day 1 then 2540m daily for 4 days 12/02/15  Yes Historical Provider, MD  clopidogrel (PLAVIX) 75 MG tablet Take 75 mg by mouth daily. 10/17/15  Yes Historical Provider, MD  Coenzyme Q10 (CO Q10 PO) Take 500 mg by mouth daily.   Yes Historical Provider, MD  cycloSPORINE modified (NEORAL) 25 MG capsule Take 75 mg by mouth 2 (two) times daily.    Yes Historical Provider, MD  fexofenadine (ALLEGRA) 180 MG tablet Take 180 mg by mouth daily.   Yes Historical Provider,  MD  fluticasone furoate-vilanterol (BREO ELLIPTA) 100-25 MCG/INH AEPB Inhale 1 puff into the lungs daily. 11/12/15  Yes RaRigoberto NoelMD  furosemide (LASIX) 20 MG tablet Take 40 mg by mouth daily as needed for fluid or edema.    Yes Historical Provider, MD  losartan (COZAAR) 50 MG tablet Take 50 mg by mouth daily with supper. 12/02/15  Yes Historical Provider, MD  magnesium oxide (MAG-OX) 400 MG tablet Take 400 mg by mouth 5 (five) times daily.    Yes Historical Provider, MD  Multiple Vitamin (MULTIVITAMIN) tablet Take 1 tablet by mouth daily.   Yes Historical Provider, MD  mycophenolate (CELLCEPT) 500 MG tablet Take 1,500 mg by mouth 2 (two) times daily.   Yes Historical Provider, MD  Omega-3 Fatty Acids (FISH OIL) 1000 MG CAPS Take 1 capsule by mouth every evening.   Yes Historical Provider, MD  omeprazole (PRILOSEC) 40 MG capsule Take 40 mg by mouth daily.   Yes Historical Provider, MD  simvastatin (ZOCOR) 40 MG tablet Take 40 mg by mouth daily.   Yes Historical Provider, MD  temazepam (RESTORIL) 15 MG capsule Take 30 mg by mouth at bedtime as needed. For sleep   Yes Historical Provider, MD  tiotropium (SPIRIVA HANDIHALER) 18 MCG inhalation capsule INHALE CONTENTS OF 1 CAPSULE VIA HANDIHALER ONCE DAILY AS DIRECTED 07/16/15  Yes RaRigoberto NoelMD   BP 128/66 mmHg  Pulse 78  Temp(Src) 100.5 F (38.1 C) (Oral)  Resp 26  Ht _0  (1.702 m)  Wt 57.879 kg  BMI 19.98 kg/m2  SpO2 93% Physical Exam  Constitutional: He appears well-developed and well-nourished. No distress.  HENT:  Head: Normocephalic and atraumatic.  Mouth/Throat: Oropharynx is clear and moist.  No tonsillar hypertrophy or exudates. Mucous membranes are slightly dry. No peritonsillar abscess. Uvula is midline without edema. Tongue protrusion is normal. No trismus. No drooling. Speech is clear and coherent.  Eyes: Conjunctivae are normal. Pupils are equal, round, and reactive to light. Right eye exhibits no discharge. Left eye  exhibits no discharge.  Neck: Normal range of motion. Neck supple. No tracheal deviation present.  Cardiovascular: Normal rate, regular rhythm, normal heart sounds and intact distal pulses.  Exam reveals no gallop and no friction rub.   No murmur heard. Pulmonary/Chest: Breath sounds normal. No stridor. No respiratory distress. He has no wheezes. He has no rales.  Increased work of breathing with respiration around 30. Lungs clear to auscultation bilaterally.  Abdominal: Soft. He exhibits no distension. There is no tenderness. There is no guarding.  Musculoskeletal: He exhibits no edema or tenderness.  No lower extremity edema or tenderness.  Lymphadenopathy:    He has no cervical adenopathy.  Neurological: He is alert. Coordination normal.  Skin: Skin is warm and dry. No rash noted. He is not diaphoretic. No erythema.  No pallor.  Psychiatric: He has a normal mood and affect. His behavior is normal.  Nursing note and vitals reviewed.   ED Course  Procedures (including critical care time) Labs Review Labs Reviewed  BASIC METABOLIC PANEL - Abnormal; Notable for the following:    Glucose, Bld 111 (*)    BUN 37 (*)    Creatinine, Ser 1.49 (*)    GFR calc non Af Amer 45 (*)    GFR calc Af Amer 52 (*)    All other components within normal limits  BRAIN NATRIURETIC PEPTIDE - Abnormal; Notable for the following:    B Natriuretic Peptide 195.1 (*)    All other components within normal limits  CBC WITH DIFFERENTIAL/PLATELET - Abnormal; Notable for the following:    Platelets 103 (*)    All other components within normal limits  URINALYSIS, ROUTINE W REFLEX MICROSCOPIC (NOT AT [facility]) - Abnormal; Notable for the following:    Protein, ur 30 (*)    All other components within normal limits  URINE MICROSCOPIC-ADD ON - Abnormal; Notable for the following:    Squamous Epithelial / LPF 0-5 (*)    Bacteria, UA RARE (*)    All other components within normal limits  CULTURE, BLOOD (ROUTINE X 2)   CULTURE, BLOOD (ROUTINE X 2)  CULTURE, EXPECTORATED SPUTUM-ASSESSMENT  GRAM STAIN  RESPIRATORY PANEL BY PCR  LACTIC ACID, PLASMA  LACTIC ACID, PLASMA  STREP PNEUMONIAE URINARY ANTIGEN  LEGIONELLA PNEUMOPHILA SEROGP 1 UR AG  CBC  BASIC METABOLIC PANEL  I-STAT TROPOININ, ED    Imaging Review Ct Soft Tissue Neck W Contrast  12/05/2015  CLINICAL DATA:  Neck pain, headache, and difficulty swallowing for 5 days. Fever. Assess for retropharyngeal abscess. EXAM: CT NECK WITH CONTRAST TECHNIQUE: Multidetector CT imaging of the neck was performed using the standard protocol following the bolus administration of intravenous contrast. CONTRAST:  75 mL Isovue 300 COMPARISON:  None. FINDINGS: Pharynx and larynx: Examination limited by respiratory motion artifact and metallic dental streak artifact. No tonsillar asymmetry, pharyngeal mass, or retropharyngeal fluid collection identified. Appearance of mild supraglottic/hypopharyngeal soft tissue thickening diffusely with slight thickening of the epiglottis. Airway is widely patent. Salivary glands: Salivary and parotid glands are unremarkable within limitations of artifact. Thyroid: Unremarkable. Lymph nodes: Scattered subcentimeter anterior cervical lymph nodes bilaterally including a 9 mm left level IV lymph node. Vascular: Major vascular structures of the neck appear patent. Mild right and moderate left carotid bifurcation atherosclerosis without evidence of significant stenosis. Limited intracranial: Unremarkable. Visualized orbits: Suspected bilateral cataract extraction, incompletely visualized. Mastoids and visualized paranasal sinuses: Clear. Skeleton: Advanced multilevel disc degeneration in the lower cervical spine. Upper chest: Visualized lung apices are grossly clear with evidence of emphysema. IMPRESSION: 1. Motion degraded examination. No evidence of retropharyngeal abscess. 2. Suggestion of mild supraglottic/hypopharyngeal swelling which may reflect  upper respiratory infection/ supraglottitis. 3. Scattered subcentimeter cervical lymph nodes, nonspecific but most likely reactive. Electronically Signed   By: Logan Bores M.D.   On: 12/05/2015 19:11   Dg Chest Port 1 View  12/05/2015  CLINICAL DATA:  Neck pain with headache and difficulty swallowing for 5 days. Fever and cough. History of heart transplant. EXAM: PORTABLE CHEST 1 VIEW COMPARISON:  01/17/2015 and 06/13/2013. FINDINGS: 1654 hours. The heart size and mediastinal contours are stable status post median sternotomy. There is aortic atherosclerosis. Compared with the prior studies, there is increased interstitial prominence throughout the lungs. Blunting of both costophrenic angles is unchanged. There is no confluent airspace  opacity or pneumothorax. Fractures of the upper sternotomy wires is unchanged. No acute osseous findings are seen. IMPRESSION: Progressive interstitial prominence in the lungs may reflect edema, atypical infection or developing interstitial lung disease. Short term radiographic follow up recommended. If persistent, high-resolution chest CT may be helpful. Electronically Signed   By: Richardean Sale M.D.   On: 12/05/2015 17:11   I have personally reviewed and evaluated these images and lab results as part of my medical decision-making.   EKG Interpretation   Date/Time:  Thursday Dec 05 2015 17:34:23 EDT Ventricular Rate:  84 PR Interval:  152 QRS Duration: 84 QT Interval:  377 QTC Calculation: 446 R Axis:   72 Text Interpretation:  Sinus rhythm Probable left atrial enlargement  Anterior infarct, old Baseline wander in lead(s) V2 Confirmed by ZAMMIT   MD, JOSEPH [phone]) on 12/05/2015 5:38:58 PM      Filed Vitals:   12/05/15 2029 12/05/15 2141 12/05/15 2205 12/05/15 2259  BP:  121/73 128/66   Pulse: 81 82 78   Temp:  99.3 F (37.4 C) 100.5 F (38.1 C)   TempSrc:  Oral Oral   Resp: _0 Height:   _1  (1.702 m)   Weight:   57.879 kg   SpO2: 93% 94%  95% 93%     MDM   Meds given in ED:  Medications  pantoprazole (PROTONIX) EC tablet 40 mg (not administered)  simvastatin (ZOCOR) tablet 40 mg (not administered)  fluticasone furoate-vilanterol (BREO ELLIPTA) 100-25 MCG/INH 1 puff (not administered)  multivitamin with minerals tablet 1 tablet (not administered)  aspirin EC tablet 81 mg (81 mg Oral Given 12/05/15 2244)  cycloSPORINE modified (NEORAL) capsule 75 mg (75 mg Oral Given 12/05/15 2242)  loratadine (CLARITIN) tablet 10 mg (not administered)  magnesium oxide (MAG-OX) tablet 400 mg (not administered)  mycophenolate (CELLCEPT) capsule 1,500 mg (1,500 mg Oral Given 12/05/15 2245)  temazepam (RESTORIL) capsule 30 mg (30 mg Oral Given 12/05/15 2238)  0.9 %  sodium chloride infusion ( Intravenous New Bag/Given 12/05/15 2232)  ipratropium-albuterol (DUONEB) 0.5-2.5 (3) MG/3ML nebulizer solution 3 mL (3 mLs Nebulization Not Given 12/05/15 2258)  albuterol (PROVENTIL) (2.5 MG/3ML) 0.083% nebulizer solution 2.5 mg (2.5 mg Nebulization Given 12/05/15 2258)  guaiFENesin (MUCINEX) 12 hr tablet 600 mg (600 mg Oral Given 12/05/15 2244)  acetaminophen (TYLENOL) tablet 650 mg (650 mg Oral Given 12/05/15 2241)  levofloxacin (LEVAQUIN) IVPB 750 mg (not administered)  clopidogrel (PLAVIX) tablet 75 mg (not administered)  sodium chloride 0.9 % bolus 500 mL (0 mLs Intravenous Stopped 12/05/15 1829)  levofloxacin (LEVAQUIN) IVPB 750 mg (0 mg Intravenous Stopped 12/05/15 2142)    Current Discharge Medication List      Final diagnoses:  CAP (community acquired pneumonia)  SOB (shortness of breath)  Sore throat    This is a 75 y.o. male with a history of a previous heart transplant in 1997 at [facility], COPD, AAA, and hypertension who presents to the emergency department from urgent care complaining of fevers and a sore throat ongoing for the past week. He was seen in urgent care last week and had negative rapid strep test. He is been on 2 rounds of antibiotics.  He's been on amoxicillin and then starting 2 days ago azithromycin. He also had a second negative strep test. He said no improvement of his symptoms. He is continued to have fevers. He reports maximum temperature 101.4 yesterday. She reports pain with swallowing. No changes to his voice. He also  complains of increased shortness of breath. He is on 6 L via nasal cannula at home. He reports increased productive cough. [facility] reports the patient had oxygen saturation around 86% on his 6 L.  On exam the patient's third is clear. No tonsillar hypertrophy or exudates. Uvula is midline without edema. No trismus. No drooling. Lactic acid is within normal limits. BMP reveals a mildly elevated creatinine 1.49. This is elevated from his baseline. No leukocytosis. CT soft tissue of his neck with contrast revealed no retropharyngeal abscess. It does show some mild supraglottic swelling which may reflect an upper respiratory infection/supraglottitis. Chest x-ray shows progressive interstitial prominence in the lungs which may reflect edema, atypical infection or developing interstitial lung disease. We suspect the patient likely has pneumonia versus URI and as the patient is currently on oxygen he has low reserve and this has contributed to his SOB. Will start on IV levaqin and admit to medicine.  I consulted with Dr. Eulas Post who accepted the patient for admission  This patient was discussed with and evaluated by Dr. Roderic Palau who agrees with assessment and plan.   Waynetta Pean, PA-C 12/06/15 [id number]  Milton Ferguson, MD 12/07/15 [phone]

## 2015-12-05 NOTE — Progress Notes (Signed)
Pt instructed on Flutter valve.  Pt demonstrated 10 good breaths.  RT will continue to monitor as needed.

## 2015-12-05 NOTE — ED Notes (Signed)
Patient transported to CT 

## 2015-12-06 DIAGNOSIS — Z955 Presence of coronary angioplasty implant and graft: Secondary | ICD-10-CM | POA: Diagnosis not present

## 2015-12-06 DIAGNOSIS — J9611 Chronic respiratory failure with hypoxia: Secondary | ICD-10-CM | POA: Diagnosis not present

## 2015-12-06 DIAGNOSIS — J441 Chronic obstructive pulmonary disease with (acute) exacerbation: Secondary | ICD-10-CM

## 2015-12-06 DIAGNOSIS — I25811 Atherosclerosis of native coronary artery of transplanted heart without angina pectoris: Secondary | ICD-10-CM | POA: Diagnosis present

## 2015-12-06 DIAGNOSIS — J040 Acute laryngitis: Secondary | ICD-10-CM | POA: Diagnosis not present

## 2015-12-06 DIAGNOSIS — J387 Other diseases of larynx: Secondary | ICD-10-CM | POA: Diagnosis present

## 2015-12-06 DIAGNOSIS — J189 Pneumonia, unspecified organism: Secondary | ICD-10-CM | POA: Diagnosis present

## 2015-12-06 DIAGNOSIS — R0602 Shortness of breath: Secondary | ICD-10-CM | POA: Diagnosis present

## 2015-12-06 DIAGNOSIS — N183 Chronic kidney disease, stage 3 (moderate): Secondary | ICD-10-CM | POA: Diagnosis present

## 2015-12-06 DIAGNOSIS — Z8249 Family history of ischemic heart disease and other diseases of the circulatory system: Secondary | ICD-10-CM | POA: Diagnosis not present

## 2015-12-06 DIAGNOSIS — N179 Acute kidney failure, unspecified: Secondary | ICD-10-CM | POA: Diagnosis present

## 2015-12-06 DIAGNOSIS — Z7902 Long term (current) use of antithrombotics/antiplatelets: Secondary | ICD-10-CM | POA: Diagnosis not present

## 2015-12-06 DIAGNOSIS — E7800 Pure hypercholesterolemia, unspecified: Secondary | ICD-10-CM | POA: Diagnosis present

## 2015-12-06 DIAGNOSIS — Z8261 Family history of arthritis: Secondary | ICD-10-CM | POA: Diagnosis not present

## 2015-12-06 DIAGNOSIS — Z7982 Long term (current) use of aspirin: Secondary | ICD-10-CM | POA: Diagnosis not present

## 2015-12-06 DIAGNOSIS — Z941 Heart transplant status: Secondary | ICD-10-CM | POA: Diagnosis not present

## 2015-12-06 DIAGNOSIS — J440 Chronic obstructive pulmonary disease with acute lower respiratory infection: Secondary | ICD-10-CM | POA: Diagnosis present

## 2015-12-06 DIAGNOSIS — I129 Hypertensive chronic kidney disease with stage 1 through stage 4 chronic kidney disease, or unspecified chronic kidney disease: Secondary | ICD-10-CM | POA: Diagnosis present

## 2015-12-06 DIAGNOSIS — Z888 Allergy status to other drugs, medicaments and biological substances status: Secondary | ICD-10-CM | POA: Diagnosis not present

## 2015-12-06 DIAGNOSIS — Z809 Family history of malignant neoplasm, unspecified: Secondary | ICD-10-CM | POA: Diagnosis not present

## 2015-12-06 DIAGNOSIS — Z9981 Dependence on supplemental oxygen: Secondary | ICD-10-CM | POA: Diagnosis not present

## 2015-12-06 DIAGNOSIS — R0902 Hypoxemia: Secondary | ICD-10-CM | POA: Diagnosis not present

## 2015-12-06 DIAGNOSIS — Z79899 Other long term (current) drug therapy: Secondary | ICD-10-CM | POA: Diagnosis not present

## 2015-12-06 DIAGNOSIS — I272 Other secondary pulmonary hypertension: Secondary | ICD-10-CM | POA: Diagnosis present

## 2015-12-06 DIAGNOSIS — I714 Abdominal aortic aneurysm, without rupture: Secondary | ICD-10-CM | POA: Diagnosis present

## 2015-12-06 LAB — RESPIRATORY PANEL BY PCR
ADENOVIRUS-RVPPCR: NOT DETECTED
Bordetella pertussis: NOT DETECTED
CHLAMYDOPHILA PNEUMONIAE-RVPPCR: NOT DETECTED
CORONAVIRUS 229E-RVPPCR: NOT DETECTED
CORONAVIRUS HKU1-RVPPCR: NOT DETECTED
CORONAVIRUS NL63-RVPPCR: NOT DETECTED
Coronavirus OC43: NOT DETECTED
INFLUENZA A H1 2009-RVPPR: NOT DETECTED
INFLUENZA A H3-RVPPCR: NOT DETECTED
INFLUENZA A-RVPPCR: NOT DETECTED
INFLUENZA B-RVPPCR: NOT DETECTED
Influenza A H1: NOT DETECTED
MYCOPLASMA PNEUMONIAE-RVPPCR: NOT DETECTED
Metapneumovirus: NOT DETECTED
PARAINFLUENZA VIRUS 4-RVPPCR: NOT DETECTED
Parainfluenza Virus 1: NOT DETECTED
Parainfluenza Virus 2: NOT DETECTED
Parainfluenza Virus 3: NOT DETECTED
Respiratory Syncytial Virus: NOT DETECTED
Rhinovirus / Enterovirus: NOT DETECTED

## 2015-12-06 LAB — EXPECTORATED SPUTUM ASSESSMENT W REFEX TO RESP CULTURE

## 2015-12-06 LAB — CBC
HCT: 36.4 % — ABNORMAL LOW (ref 39.0–52.0)
HEMOGLOBIN: 11.5 g/dL — AB (ref 13.0–17.0)
MCH: 29.5 pg (ref 26.0–34.0)
MCHC: 31.6 g/dL (ref 30.0–36.0)
MCV: 93.3 fL (ref 78.0–100.0)
Platelets: 84 10*3/uL — ABNORMAL LOW (ref 150–400)
RBC: 3.9 MIL/uL — AB (ref 4.22–5.81)
RDW: 14.5 % (ref 11.5–15.5)
WBC: 5.8 10*3/uL (ref 4.0–10.5)

## 2015-12-06 LAB — BASIC METABOLIC PANEL
Anion gap: 6 (ref 5–15)
BUN: 38 mg/dL — ABNORMAL HIGH (ref 6–20)
CHLORIDE: 106 mmol/L (ref 101–111)
CO2: 25 mmol/L (ref 22–32)
Calcium: 8.5 mg/dL — ABNORMAL LOW (ref 8.9–10.3)
Creatinine, Ser: 1.45 mg/dL — ABNORMAL HIGH (ref 0.61–1.24)
GFR calc Af Amer: 54 mL/min — ABNORMAL LOW (ref >60)
GFR calc non Af Amer: 46 mL/min — ABNORMAL LOW (ref >60)
Glucose, Bld: 122 mg/dL — ABNORMAL HIGH (ref 65–99)
POTASSIUM: 4.1 mmol/L (ref 3.5–5.1)
SODIUM: 137 mmol/L (ref 135–145)

## 2015-12-06 LAB — STREP PNEUMONIAE URINARY ANTIGEN: STREP PNEUMO URINARY ANTIGEN: NEGATIVE

## 2015-12-06 LAB — RAPID STREP SCREEN (MED CTR MEBANE ONLY): Streptococcus, Group A Screen (Direct): NEGATIVE

## 2015-12-06 LAB — EXPECTORATED SPUTUM ASSESSMENT W GRAM STAIN, RFLX TO RESP C

## 2015-12-06 MED ORDER — LEVOFLOXACIN IN D5W 750 MG/150ML IV SOLN: 750.0000 mg | INTRAVENOUS | Status: DC

## 2015-12-06 MED ORDER — TADALAFIL (PAH) 20 MG PO TABS
40.0000 mg | ORAL_TABLET | Freq: Every day | ORAL | Status: DC
Start: 2015-12-06 — End: 2015-12-08
  Administered 2015-12-06 – 2015-12-08 (×3): 40 mg via ORAL

## 2015-12-06 MED ORDER — VANCOMYCIN HCL IN DEXTROSE 750-5 MG/150ML-% IV SOLN
750.0000 mg | Freq: Every day | INTRAVENOUS | Status: DC
  Administered 2015-12-06: 750 mg via INTRAVENOUS
  Filled 2015-12-06: qty 150

## 2015-12-06 MED ORDER — ENSURE ENLIVE PO LIQD
237.0000 mL | Freq: Two times a day (BID) | ORAL | Status: DC
  Administered 2015-12-06 – 2015-12-07 (×2): 237 mL via ORAL

## 2015-12-06 MED ORDER — SODIUM CHLORIDE 0.9 % IV SOLN
3.0000 g | Freq: Four times a day (QID) | INTRAVENOUS | Status: DC
  Administered 2015-12-06 – 2015-12-08 (×8): 3 g via INTRAVENOUS
  Filled 2015-12-06 (×10): qty 3

## 2015-12-06 NOTE — Consult Note (Signed)
Reason for Consult:sore throat Referring Physician: hospitalist  Brenton Joines. is an 75 y.o. male.  HPI: hx of one week of sore throat. He has hx of immunocompromise secondary to heart trans[plant. He has some feeling like the food sticks. He has the discomfort low in the neck. He has change in voice. He has been seen by ID and recommended a look at glottis for possible fungus.   Past Medical History  Diagnosis Date  . Hypertension   . Shortness of breath   . Hypercholesteremia   . HIT (heparin-induced thrombocytopenia) ([place])   . COPD (chronic obstructive pulmonary disease) ([place])   . AAA (abdominal aortic aneurysm) ([place])   . Arrhythmia has had episodes of fast heart rate    Past Surgical History  Procedure Laterality Date  . Heart transplant    . Heart stents  Sept 2016    2 stents    Family History  Problem Relation Age of Onset  . Heart attack Father   . Heart disease Mother   . Rheum arthritis Mother   . Rheum arthritis Paternal Grandfather   . Cancer Paternal Grandfather     Social History:  reports that he quit smoking about 21 years ago. His smoking use included Cigarettes. He has a 30 pack-year smoking history. He has never used smokeless tobacco. He reports that he drinks alcohol. He reports that he does not use illicit drugs.  Allergies:  Allergies  Allergen Reactions  . Ambrisentan Other (See Comments), Itching and Swelling  . Heparin     Made white blood count go down. HIT    Medications: I have reviewed the patient's current medications.  Results for orders placed or performed during the hospital encounter of 12/05/15 (from the past 48 hour(s))  Basic metabolic panel     Status: Abnormal   Collection Time: 12/05/15  5:25 PM  Result Value Ref Range   Sodium 137 135 - 145 mmol/L   Potassium 4.2 3.5 - 5.1 mmol/L   Chloride 104 101 - 111 mmol/L   CO2 26 22 - 32 mmol/L   Glucose, Bld 111 (H) 65 - 99 mg/dL   BUN 37 (H) 6 - 20 mg/dL   Creatinine, Ser  1.49 (H) 0.61 - 1.24 mg/dL   Calcium 8.9 8.9 - 10.3 mg/dL   GFR calc non Af Amer 45 (L) >60 mL/min   GFR calc Af Amer 52 (L) >60 mL/min    Comment: (NOTE) The eGFR has been calculated using the CKD EPI equation. This calculation has not been validated in all clinical situations. eGFR's persistently <60 mL/min signify possible Chronic Kidney Disease.    Anion gap 7 5 - 15  Brain natriuretic peptide     Status: Abnormal   Collection Time: 12/05/15  5:25 PM  Result Value Ref Range   B Natriuretic Peptide 195.1 (H) 0.0 - 100.0 pg/mL  CBC with Differential     Status: Abnormal   Collection Time: 12/05/15  5:25 PM  Result Value Ref Range   WBC 7.2 4.0 - 10.5 K/uL   RBC 4.54 4.22 - 5.81 MIL/uL   Hemoglobin 13.8 13.0 - 17.0 g/dL   HCT 41.0 39.0 - 52.0 %   MCV 90.3 78.0 - 100.0 fL   MCH 30.4 26.0 - 34.0 pg   MCHC 33.7 30.0 - 36.0 g/dL   RDW 14.1 11.5 - 15.5 %   Platelets 103 (L) 150 - 400 K/uL    Comment: REPEATED TO VERIFY SPECIMEN CHECKED  FOR CLOTS PLATELET COUNT CONFIRMED BY SMEAR    Neutrophils Relative % 79 %   Neutro Abs 5.7 1.7 - 7.7 K/uL   Lymphocytes Relative 10 %   Lymphs Abs 0.7 0.7 - 4.0 K/uL   Monocytes Relative 11 %   Monocytes Absolute 0.8 0.1 - 1.0 K/uL   Eosinophils Relative 0 %   Eosinophils Absolute 0.0 0.0 - 0.7 K/uL   Basophils Relative 0 %   Basophils Absolute 0.0 0.0 - 0.1 K/uL  I-stat troponin, ED     Status: None   Collection Time: 12/05/15  5:31 PM  Result Value Ref Range   Troponin i, poc 0.02 0.00 - 0.08 ng/mL   Comment 3            Comment: Due to the release kinetics of cTnI, a negative result within the first hours of the onset of symptoms does not rule out myocardial infarction with certainty. If myocardial infarction is still suspected, repeat the test at appropriate intervals.   Urinalysis, Routine w reflex microscopic     Status: Abnormal   Collection Time: 12/05/15  7:33 PM  Result Value Ref Range   Color, Urine YELLOW YELLOW    APPearance CLEAR CLEAR   Specific Gravity, Urine 1.024 1.005 - 1.030   pH 6.0 5.0 - 8.0   Glucose, UA NEGATIVE NEGATIVE mg/dL   Hgb urine dipstick NEGATIVE NEGATIVE   Bilirubin Urine NEGATIVE NEGATIVE   Ketones, ur NEGATIVE NEGATIVE mg/dL   Protein, ur 30 (A) NEGATIVE mg/dL   Nitrite NEGATIVE NEGATIVE   Leukocytes, UA NEGATIVE NEGATIVE  Urine microscopic-add on     Status: Abnormal   Collection Time: 12/05/15  7:33 PM  Result Value Ref Range   Squamous Epithelial / LPF 0-5 (A) NONE SEEN   WBC, UA 0-5 0 - 5 WBC/hpf   RBC / HPF 0-5 0 - 5 RBC/hpf   Bacteria, UA RARE (A) NONE SEEN  Strep pneumoniae urinary antigen     Status: None   Collection Time: 12/05/15  7:33 PM  Result Value Ref Range   Strep Pneumo Urinary Antigen NEGATIVE NEGATIVE    Comment:        Infection due to S. pneumoniae cannot be absolutely ruled out since the antigen present may be below the detection limit of the test. Performed at [facility]   Lactic acid, plasma     Status: None   Collection Time: 12/05/15  7:51 PM  Result Value Ref Range   Lactic Acid, Venous 0.9 0.5 - 2.0 mmol/L  Blood culture (routine x 2)     Status: None (Preliminary result)   Collection Time: 12/05/15  9:49 PM  Result Value Ref Range   Specimen Description RIGHT ANTECUBITAL    Special Requests BOTTLES DRAWN AEROBIC AND ANAEROBIC 5CC    Culture      NO GROWTH < 24 HOURS Performed at [facility]    Report Status PENDING   Blood culture (routine x 2)     Status: None (Preliminary result)   Collection Time: 12/05/15  9:49 PM  Result Value Ref Range   Specimen Description BLOOD RIGHT HAND    Special Requests BOTTLES DRAWN AEROBIC ONLY 5CC    Culture      NO GROWTH < 24 HOURS Performed at [facility]    Report Status PENDING   Lactic acid, plasma     Status: None   Collection Time: 12/05/15 10:35 PM  Result Value Ref Range  Lactic Acid, Venous 0.9 0.5 - 2.0 mmol/L  Respiratory Panel by PCR      Status: None   Collection Time: 12/05/15 11:55 PM  Result Value Ref Range   Adenovirus NOT DETECTED NOT DETECTED   Coronavirus 229E NOT DETECTED NOT DETECTED   Coronavirus HKU1 NOT DETECTED NOT DETECTED   Coronavirus NL63 NOT DETECTED NOT DETECTED   Coronavirus OC43 NOT DETECTED NOT DETECTED   Metapneumovirus NOT DETECTED NOT DETECTED   Rhinovirus / Enterovirus NOT DETECTED NOT DETECTED   Influenza A NOT DETECTED NOT DETECTED   Influenza A H1 NOT DETECTED NOT DETECTED   Influenza A H1 2009 NOT DETECTED NOT DETECTED   Influenza A H3 NOT DETECTED NOT DETECTED   Influenza B NOT DETECTED NOT DETECTED   Parainfluenza Virus 1 NOT DETECTED NOT DETECTED   Parainfluenza Virus 2 NOT DETECTED NOT DETECTED   Parainfluenza Virus 3 NOT DETECTED NOT DETECTED   Parainfluenza Virus 4 NOT DETECTED NOT DETECTED   Respiratory Syncytial Virus NOT DETECTED NOT DETECTED   Bordetella pertussis NOT DETECTED NOT DETECTED   Chlamydophila pneumoniae NOT DETECTED NOT DETECTED   Mycoplasma pneumoniae NOT DETECTED NOT DETECTED  Culture, sputum-assessment     Status: None   Collection Time: 12/06/15  1:34 AM  Result Value Ref Range   Specimen Description SPUTUM    Special Requests Immunocompromised    Sputum evaluation      THIS SPECIMEN IS ACCEPTABLE. RESPIRATORY CULTURE REPORT TO FOLLOW.   Report Status 12/06/2015 FINAL   Culture, respiratory (NON-Expectorated)     Status: None (Preliminary result)   Collection Time: 12/06/15  1:34 AM  Result Value Ref Range   Specimen Description SPUTUM    Special Requests NONE    Gram Stain      ABUNDANT WBC PRESENT, PREDOMINANTLY PMN MODERATE GRAM NEGATIVE RODS FEW GRAM POSITIVE COCCI IN PAIRS    Culture PENDING    Report Status PENDING   CBC     Status: Abnormal   Collection Time: 12/06/15  4:54 AM  Result Value Ref Range   WBC 5.8 4.0 - 10.5 K/uL   RBC 3.90 (L) 4.22 - 5.81 MIL/uL   Hemoglobin 11.5 (L) 13.0 - 17.0 g/dL   HCT 36.4 (L) 39.0 - 52.0 %   MCV 93.3  78.0 - 100.0 fL   MCH 29.5 26.0 - 34.0 pg   MCHC 31.6 30.0 - 36.0 g/dL   RDW 14.5 11.5 - 15.5 %   Platelets 84 (L) 150 - 400 K/uL    Comment: CONSISTENT WITH PREVIOUS RESULT  Basic metabolic panel     Status: Abnormal   Collection Time: 12/06/15  4:54 AM  Result Value Ref Range   Sodium 137 135 - 145 mmol/L   Potassium 4.1 3.5 - 5.1 mmol/L   Chloride 106 101 - 111 mmol/L   CO2 25 22 - 32 mmol/L   Glucose, Bld 122 (H) 65 - 99 mg/dL   BUN 38 (H) 6 - 20 mg/dL   Creatinine, Ser 1.45 (H) 0.61 - 1.24 mg/dL   Calcium 8.5 (L) 8.9 - 10.3 mg/dL   GFR calc non Af Amer 46 (L) >60 mL/min   GFR calc Af Amer 54 (L) >60 mL/min    Comment: (NOTE) The eGFR has been calculated using the CKD EPI equation. This calculation has not been validated in all clinical situations. eGFR's persistently <60 mL/min signify possible Chronic Kidney Disease.    Anion gap 6 5 - 15  Rapid strep screen (not  at [facility])     Status: None   Collection Time: 12/06/15 11:53 AM  Result Value Ref Range   Streptococcus, Group A Screen (Direct) NEGATIVE NEGATIVE    Comment: (NOTE) A Rapid Antigen test may result negative if the antigen level in the sample is below the detection level of this test. The FDA has not cleared this test as a stand-alone test therefore the rapid antigen negative result has reflexed to a Group A Strep culture.     Ct Soft Tissue Neck W Contrast  12/05/2015  CLINICAL DATA:  Neck pain, headache, and difficulty swallowing for 5 days. Fever. Assess for retropharyngeal abscess. EXAM: CT NECK WITH CONTRAST TECHNIQUE: Multidetector CT imaging of the neck was performed using the standard protocol following the bolus administration of intravenous contrast. CONTRAST:  75 mL Isovue 300 COMPARISON:  None. FINDINGS: Pharynx and larynx: Examination limited by respiratory motion artifact and metallic dental streak artifact. No tonsillar asymmetry, pharyngeal mass, or retropharyngeal fluid collection identified.  Appearance of mild supraglottic/hypopharyngeal soft tissue thickening diffusely with slight thickening of the epiglottis. Airway is widely patent. Salivary glands: Salivary and parotid glands are unremarkable within limitations of artifact. Thyroid: Unremarkable. Lymph nodes: Scattered subcentimeter anterior cervical lymph nodes bilaterally including a 9 mm left level IV lymph node. Vascular: Major vascular structures of the neck appear patent. Mild right and moderate left carotid bifurcation atherosclerosis without evidence of significant stenosis. Limited intracranial: Unremarkable. Visualized orbits: Suspected bilateral cataract extraction, incompletely visualized. Mastoids and visualized paranasal sinuses: Clear. Skeleton: Advanced multilevel disc degeneration in the lower cervical spine. Upper chest: Visualized lung apices are grossly clear with evidence of emphysema. IMPRESSION: 1. Motion degraded examination. No evidence of retropharyngeal abscess. 2. Suggestion of mild supraglottic/hypopharyngeal swelling which may reflect upper respiratory infection/ supraglottitis. 3. Scattered subcentimeter cervical lymph nodes, nonspecific but most likely reactive. Electronically Signed   By: Logan Bores M.D.   On: 12/05/2015 19:11   Dg Chest Port 1 View  12/05/2015  CLINICAL DATA:  Neck pain with headache and difficulty swallowing for 5 days. Fever and cough. History of heart transplant. EXAM: PORTABLE CHEST 1 VIEW COMPARISON:  01/17/2015 and 06/13/2013. FINDINGS: 1654 hours. The heart size and mediastinal contours are stable status post median sternotomy. There is aortic atherosclerosis. Compared with the prior studies, there is increased interstitial prominence throughout the lungs. Blunting of both costophrenic angles is unchanged. There is no confluent airspace opacity or pneumothorax. Fractures of the upper sternotomy wires is unchanged. No acute osseous findings are seen. IMPRESSION: Progressive interstitial  prominence in the lungs may reflect edema, atypical infection or developing interstitial lung disease. Short term radiographic follow up recommended. If persistent, high-resolution chest CT may be helpful. Electronically Signed   By: Richardean Sale M.D.   On: 12/05/2015 17:11    ROS Blood pressure 119/64, pulse 75, temperature 98.1 F (36.7 C), temperature source Oral, resp. rate 18, height _0  (1.702 m), weight 57.879 kg (127 lb 9.6 oz), SpO2 95 %. Physical Exam  Constitutional: He appears well-developed and well-nourished.  Eyes: Pupils are equal, round, and reactive to light.    Assessment/Plan: Sore throat- He has some possible esophageal problem but I first need to evaluate the pharynx. His ct scan indicates some possible supraglottic swelling.  I called 4 hours ago for the fibroptic scope with light source and arrived to still not have the proper equipment. This situation happens EVERYTIME I come to this hospital. I can never get the ENT equipment so I cannot  take care of the patient. I have requested again for the proper equipment now.   Melissa Montane 12/06/2015, 5:06 PM

## 2015-12-06 NOTE — Progress Notes (Signed)
PROGRESS NOTE        PATIENT DETAILS Name: Luke Jacobson. Age: 75 y.o. Sex: male Date of Birth: 1941-03-22 Admit Date: 12/05/2015 Admitting Physician Lily Kocher, MD ZOX:[id number],WUJWJ W, MD  Brief Narrative: Patient is a 75 y.o. male cardiac transplantation in 1996 (follows at [facility]) on immunosuppressant therapy (Cellcept/Cyclosporin), COPD, Pul HTN, chronic hypoxic resp failure on home O2 6-7 L/m admitted with 7-10 day hx of fever, sore throat and cough. Has completed a course of amoxicillin and zithromax as outpatient with no relief. On admission found to be febrile, CT Neck suggestive of supra glottitis. Subsequently admitted with empiric Abx.   Subjective: Essentially unchanged this am.   Assessment/Plan: Principal Problem: Acute Laryngitis/Supra glottitis:ongoing for the past 4-10 days-failed outpatient Abx (amoxicillin and Zithromax)-essentially unchanged since admission. Appears non toxic, not in any distress or stridor. Spoke with Dr Tommy Medal, recommends starting Unasyn, spoke with ENT- Dr Thurnell Lose will evaluate with a direct layrngoscopic exam-given recent Abx tx/immunosuppression at risk for thrush/atypical infections. Monitor on IV Unasyn and await ENT eval. Await resp virus panel, strep throat. Follow blood cultures.   Active Problems: COPD: not wheezing-moving air well-appears stable-continue Bronchodilator regimen  Pul HTN: continue Tadalafil-Recent [facility] at [facility] 4/11-showed RVSP at 72 mmHg  Chronic Hypoxic Resp Failure: continue O2 supplementation-on 6-7 L/m at home.    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 Continue ASA/Plavix, statin.Recent Echo at [facility] 4/11 showed preserved EF with severe LVH  CKD stage 3: creatinine close to usual baseline (reviewed labs in care everywhere), follow   DVT Prophylaxis: Prophylactic Lovenox or Heparin or SCD's Full dose anticoagulation with  Heparin/Lovenox/Coumadin/Eliquis/Xarelto/Pradaxa  Code Status: Full code or DNR  Family Communication: None at bedside  Disposition Plan: Remain inpatient-requires 1-2 more days of additional hospitalization  Antimicrobial agents: IV Unasyn 5/26>> IV Vancomycin 5/25>>5/26 IV Levaquin 5/25>>5/26  Procedures: None  CONSULTS:  ENT  Time spent: 25 minutes-Greater than 50% of this time was spent in counseling, explanation of diagnosis, planning of further management, and coordination of care.  MEDICATIONS: Anti-infectives    Start     Dose/Rate Route Frequency Ordered Stop   12/07/15 2000  levofloxacin (LEVAQUIN) IVPB 750 mg  Status:  Discontinued     750 mg 100 mL/hr over 90 Minutes Intravenous Every 48 hours 12/06/15 0619 12/06/15 1037   12/06/15 2100  levofloxacin (LEVAQUIN) IVPB 750 mg  Status:  Discontinued     750 mg 100 mL/hr over 90 Minutes Intravenous Every 24 hours 12/05/15 2204 12/06/15 0619   12/06/15 1200  Ampicillin-Sulbactam (UNASYN) 3 g in sodium chloride 0.9 % 100 mL IVPB     3 g 100 mL/hr over 60 Minutes Intravenous Every 6 hours 12/06/15 1053     12/06/15 0245  vancomycin (VANCOCIN) IVPB 750 mg/150 ml premix  Status:  Discontinued     750 mg 150 mL/hr over 60 Minutes Intravenous Daily at bedtime 12/06/15 0234 12/06/15 1037   12/05/15 2000  levofloxacin (LEVAQUIN) IVPB 750 mg     750 mg 100 mL/hr over 90 Minutes Intravenous  Once 12/05/15 1949 12/05/15 2142      Scheduled Meds: . ampicillin-sulbactam (UNASYN) IV  3 g Intravenous Q6H  . aspirin EC  81 mg Oral QHS  . clopidogrel  75 mg Oral Daily  . cycloSPORINE modified  75 mg Oral BID  .  feeding supplement (ENSURE ENLIVE)  237 mL Oral BID BM  . fluticasone furoate-vilanterol  1 puff Inhalation Daily  . guaiFENesin  600 mg Oral BID  . ipratropium-albuterol  3 mL Nebulization QID  . loratadine  10 mg Oral Daily  . magnesium oxide  400 mg Oral 5 X Daily  . multivitamin with minerals  1 tablet Oral  Daily  . mycophenolate  1,500 mg Oral BID  . pantoprazole  40 mg Oral Daily  . simvastatin  40 mg Oral Daily  . Tadalafil (PAH)  40 mg Oral Daily   Continuous Infusions:  PRN Meds:.acetaminophen, albuterol, temazepam   PHYSICAL EXAM: Vital signs: Filed Vitals:   12/05/15 2259 12/06/15 0506 12/06/15 0723 12/06/15 0724  BP:  100/70    Pulse:  70    Temp:  98.4 F (36.9 C)    TempSrc:  Oral    Resp:  22    Height:      Weight:      SpO2: 93% 98% 96% 96%   Filed Weights   12/05/15 1724 12/05/15 2205  Weight: 57.153 kg (126 lb) 57.879 kg (127 lb 9.6 oz)   Body mass index is 19.98 kg/(m^2).   Gen Exam: Awake and alert with clear speech. Not in any distress Neck: Supple, No JVD.   Chest: B/L Clear.   CVS: S1 S2 Regular, no murmurs.  Abdomen: soft, BS +, non tender, non distended.  Extremities: no edema, lower extremities warm to touch. Neurologic: Non Focal.   Skin: No Rash or lesions   Wounds: N/A.    LABORATORY DATA: CBC:  Recent Labs Lab 12/05/15 1725 12/06/15 0454  WBC 7.2 5.8  NEUTROABS 5.7  --   HGB 13.8 11.5*  HCT 41.0 36.4*  MCV 90.3 93.3  PLT 103* 84*    Basic Metabolic Panel:  Recent Labs Lab 12/05/15 1725 12/06/15 0454  NA 137 137  K 4.2 4.1  CL 104 106  CO2 26 25  GLUCOSE 111* 122*  BUN 37* 38*  CREATININE 1.49* 1.45*  CALCIUM 8.9 8.5*    GFR: Estimated Creatinine Clearance: 37.2 mL/min (by C-G formula based on Cr of 1.45).  Liver Function Tests: No results for input(s): AST, ALT, ALKPHOS, BILITOT, PROT, ALBUMIN in the last 168 hours. No results for input(s): LIPASE, AMYLASE in the last 168 hours. No results for input(s): AMMONIA in the last 168 hours.  Coagulation Profile: No results for input(s): INR, PROTIME in the last 168 hours.  Cardiac Enzymes: No results for input(s): CKTOTAL, CKMB, CKMBINDEX, TROPONINI in the last 168 hours.  BNP (last 3 results) No results for input(s): PROBNP in the last 8760 hours.  HbA1C: No  results for input(s): HGBA1C in the last 72 hours.  CBG: No results for input(s): GLUCAP in the last 168 hours.  Lipid Profile: No results for input(s): CHOL, HDL, LDLCALC, TRIG, CHOLHDL, LDLDIRECT in the last 72 hours.  Thyroid Function Tests: No results for input(s): TSH, T4TOTAL, FREET4, T3FREE, THYROIDAB in the last 72 hours.  Anemia Panel: No results for input(s): VITAMINB12, FOLATE, FERRITIN, TIBC, IRON, RETICCTPCT in the last 72 hours.  Urine analysis:    Component Value Date/Time   COLORURINE YELLOW 12/05/2015 1933   APPEARANCEUR CLEAR 12/05/2015 1933   LABSPEC 1.024 12/05/2015 1933   PHURINE 6.0 12/05/2015 1933   GLUCOSEU NEGATIVE 12/05/2015 1933   HGBUR NEGATIVE 12/05/2015 [place] NEGATIVE 12/05/2015 [place] NEGATIVE 12/05/2015 1933   PROTEINUR 30* 12/05/2015 1933  UROBILINOGEN 1.0 03/10/2012 1603   NITRITE NEGATIVE 12/05/2015 1933   LEUKOCYTESUR NEGATIVE 12/05/2015 1933    Sepsis Labs: Lactic Acid, Venous    Component Value Date/Time   LATICACIDVEN 0.9 12/05/2015 2235    MICROBIOLOGY: Recent Results (from the past 240 hour(s))  Culture, sputum-assessment     Status: None   Collection Time: 12/06/15  1:34 AM  Result Value Ref Range Status   Specimen Description SPUTUM  Final   Special Requests Immunocompromised  Final   Sputum evaluation   Final    THIS SPECIMEN IS ACCEPTABLE. RESPIRATORY CULTURE REPORT TO FOLLOW.   Report Status 12/06/2015 FINAL  Final    RADIOLOGY STUDIES/RESULTS: Ct Soft Tissue Neck W Contrast  12/05/2015  CLINICAL DATA:  Neck pain, headache, and difficulty swallowing for 5 days. Fever. Assess for retropharyngeal abscess. EXAM: CT NECK WITH CONTRAST TECHNIQUE: Multidetector CT imaging of the neck was performed using the standard protocol following the bolus administration of intravenous contrast. CONTRAST:  75 mL Isovue 300 COMPARISON:  None. FINDINGS: Pharynx and larynx: Examination limited by respiratory motion  artifact and metallic dental streak artifact. No tonsillar asymmetry, pharyngeal mass, or retropharyngeal fluid collection identified. Appearance of mild supraglottic/hypopharyngeal soft tissue thickening diffusely with slight thickening of the epiglottis. Airway is widely patent. Salivary glands: Salivary and parotid glands are unremarkable within limitations of artifact. Thyroid: Unremarkable. Lymph nodes: Scattered subcentimeter anterior cervical lymph nodes bilaterally including a 9 mm left level IV lymph node. Vascular: Major vascular structures of the neck appear patent. Mild right and moderate left carotid bifurcation atherosclerosis without evidence of significant stenosis. Limited intracranial: Unremarkable. Visualized orbits: Suspected bilateral cataract extraction, incompletely visualized. Mastoids and visualized paranasal sinuses: Clear. Skeleton: Advanced multilevel disc degeneration in the lower cervical spine. Upper chest: Visualized lung apices are grossly clear with evidence of emphysema. IMPRESSION: 1. Motion degraded examination. No evidence of retropharyngeal abscess. 2. Suggestion of mild supraglottic/hypopharyngeal swelling which may reflect upper respiratory infection/ supraglottitis. 3. Scattered subcentimeter cervical lymph nodes, nonspecific but most likely reactive. Electronically Signed   By: Logan Bores M.D.   On: 12/05/2015 19:11   Dg Chest Port 1 View  12/05/2015  CLINICAL DATA:  Neck pain with headache and difficulty swallowing for 5 days. Fever and cough. History of heart transplant. EXAM: PORTABLE CHEST 1 VIEW COMPARISON:  01/17/2015 and 06/13/2013. FINDINGS: 1654 hours. The heart size and mediastinal contours are stable status post median sternotomy. There is aortic atherosclerosis. Compared with the prior studies, there is increased interstitial prominence throughout the lungs. Blunting of both costophrenic angles is unchanged. There is no confluent airspace opacity or  pneumothorax. Fractures of the upper sternotomy wires is unchanged. No acute osseous findings are seen. IMPRESSION: Progressive interstitial prominence in the lungs may reflect edema, atypical infection or developing interstitial lung disease. Short term radiographic follow up recommended. If persistent, high-resolution chest CT may be helpful. Electronically Signed   By: Richardean Sale M.D.   On: 12/05/2015 17:11       Oren Binet, MD  [facility] Pager:[phone]  If 7PM-7AM, please contact night-coverage [email or web address] Password [facility] 12/06/2015, 11:34 AM

## 2015-12-06 NOTE — Care Management Obs Status (Signed)
[place] NOTIFICATION   Patient Details  Name: Luke Jacobson. MRN: [id number] Date of Birth: 10-11-1940   Medicare Observation Status Notification Given:  Yes    Dessa Phi, RN 12/06/2015, 1:12 PM

## 2015-12-06 NOTE — Progress Notes (Addendum)
Subjective: Here for scope  Objective: Vital signs in last 24 hours: Temp:  [98.1 F (36.7 C)-100.5 F (38.1 C)] 98.1 F (36.7 C) (05/26 1413) Pulse Rate:  [70-82] 75 (05/26 1413) Resp:  [18-26] 18 (05/26 1413) BP: (100-128)/(64-73) 119/64 mmHg (05/26 1413) SpO2:  [93 %-98 %] 94 % (05/26 1921) Weight:  [57.879 kg (127 lb 9.6 oz)] 57.879 kg (127 lb 9.6 oz) (05/25 2205) Last BM Date: 12/05/15  Intake/Output from previous day: 05/25 0701 - 05/26 0700 In: 871.7 [I.V.:721.7; IV Piggyback:150] Out: 300 [Urine:300] Intake/Output this shift:    FOE- the nose has some crusting in ant aspect bilaterally. the nasopharynx and oroparynx looks clear. the larynx is very irritated. the epiglottis has exudate/ulceration but not much swelling. the arytenoids are erythematous and also exudate especially on the right. there is thickening of the right TVc and erythema. Cords seems to move normal . no airway problem. Oc/op without erythema or lesions.   Lab Results:   Recent Labs  12/05/15 1725 12/06/15 0454  WBC 7.2 5.8  HGB 13.8 11.5*  HCT 41.0 36.4*  PLT 103* 84*   BMET  Recent Labs  12/05/15 1725 12/06/15 0454  NA 137 137  K 4.2 4.1  CL 104 106  CO2 26 25  GLUCOSE 111* 122*  BUN 37* 38*  CREATININE 1.49* 1.45*  CALCIUM 8.9 8.5*   PT/INR No results for input(s): LABPROT, INR in the last 72 hours. ABG No results for input(s): PHART, HCO3 in the last 72 hours.  Invalid input(s): PCO2, PO2  Studies/Results: Ct Soft Tissue Neck W Contrast  12/05/2015  CLINICAL DATA:  Neck pain, headache, and difficulty swallowing for 5 days. Fever. Assess for retropharyngeal abscess. EXAM: CT NECK WITH CONTRAST TECHNIQUE: Multidetector CT imaging of the neck was performed using the standard protocol following the bolus administration of intravenous contrast. CONTRAST:  75 mL Isovue 300 COMPARISON:  None. FINDINGS: Pharynx and larynx: Examination limited by respiratory motion artifact and  metallic dental streak artifact. No tonsillar asymmetry, pharyngeal mass, or retropharyngeal fluid collection identified. Appearance of mild supraglottic/hypopharyngeal soft tissue thickening diffusely with slight thickening of the epiglottis. Airway is widely patent. Salivary glands: Salivary and parotid glands are unremarkable within limitations of artifact. Thyroid: Unremarkable. Lymph nodes: Scattered subcentimeter anterior cervical lymph nodes bilaterally including a 9 mm left level IV lymph node. Vascular: Major vascular structures of the neck appear patent. Mild right and moderate left carotid bifurcation atherosclerosis without evidence of significant stenosis. Limited intracranial: Unremarkable. Visualized orbits: Suspected bilateral cataract extraction, incompletely visualized. Mastoids and visualized paranasal sinuses: Clear. Skeleton: Advanced multilevel disc degeneration in the lower cervical spine. Upper chest: Visualized lung apices are grossly clear with evidence of emphysema. IMPRESSION: 1. Motion degraded examination. No evidence of retropharyngeal abscess. 2. Suggestion of mild supraglottic/hypopharyngeal swelling which may reflect upper respiratory infection/ supraglottitis. 3. Scattered subcentimeter cervical lymph nodes, nonspecific but most likely reactive. Electronically Signed   By: Logan Bores M.D.   On: 12/05/2015 19:11   Dg Chest Port 1 View  12/05/2015  CLINICAL DATA:  Neck pain with headache and difficulty swallowing for 5 days. Fever and cough. History of heart transplant. EXAM: PORTABLE CHEST 1 VIEW COMPARISON:  01/17/2015 and 06/13/2013. FINDINGS: 1654 hours. The heart size and mediastinal contours are stable status post median sternotomy. There is aortic atherosclerosis. Compared with the prior studies, there is increased interstitial prominence throughout the lungs. Blunting of both costophrenic angles is unchanged. There is no confluent airspace opacity or pneumothorax.  Fractures of the upper sternotomy wires is unchanged. No acute osseous findings are seen. IMPRESSION: Progressive interstitial prominence in the lungs may reflect edema, atypical infection or developing interstitial lung disease. Short term radiographic follow up recommended. If persistent, high-resolution chest CT may be helpful. Electronically Signed   By: Richardean Sale M.D.   On: 12/05/2015 17:11    Anti-infectives: Anti-infectives    Start     Dose/Rate Route Frequency Ordered Stop   12/07/15 2000  levofloxacin (LEVAQUIN) IVPB 750 mg  Status:  Discontinued     750 mg 100 mL/hr over 90 Minutes Intravenous Every 48 hours 12/06/15 0619 12/06/15 1037   12/06/15 2100  levofloxacin (LEVAQUIN) IVPB 750 mg  Status:  Discontinued     750 mg 100 mL/hr over 90 Minutes Intravenous Every 24 hours 12/05/15 2204 12/06/15 0619   12/06/15 1200  Ampicillin-Sulbactam (UNASYN) 3 g in sodium chloride 0.9 % 100 mL IVPB     3 g 100 mL/hr over 60 Minutes Intravenous Every 6 hours 12/06/15 1053     12/06/15 0245  vancomycin (VANCOCIN) IVPB 750 mg/150 ml premix  Status:  Discontinued     750 mg 150 mL/hr over 60 Minutes Intravenous Daily at bedtime 12/06/15 0234 12/06/15 1037   12/05/15 2000  levofloxacin (LEVAQUIN) IVPB 750 mg     750 mg 100 mL/hr over 90 Minutes Intravenous  Once 12/05/15 1949 12/05/15 2142      Assessment/Plan: s/p * No surgery found * he has supraglottis and it does not appear to be consistent with fungal. It is ulcerative so viral is consideration but certain bacterial of many sources is also consistent. there is a very remote chance this is neoplasm but I doubt that given presentation. Only recommendation is continue current care.  I did finally get the scope light source by calling the [facility]. He was very helpful. It took 8 hours for the patient care to finally occur.   LOS: 0 days    Melissa Montane 12/06/2015

## 2015-12-06 NOTE — Progress Notes (Signed)
Pharmacy Antibiotic Note  Luke Jacobson. is a 75 y.o. male with hx heart transplant in 1996 admitted on 12/05/2015 with increased sputum production, sore throat, decreased PO intake, recurrent fever.  Pharmacy has been consulted for Unasyn dosing for laryngitis/supra glottitis.  Plan:  Unasyn 3g IV q6h  Monitor renal function and adjust as needed  Follow up culture data  Height: _0  (170.2 cm) Weight: 127 lb 9.6 oz (57.879 kg) IBW/kg (Calculated) : 66.1  Temp (24hrs), Avg:99.9 F (37.7 C), Min:98.4 F (36.9 C), Max:101.1 F (38.4 C)   Recent Labs Lab 12/05/15 1725 12/05/15 1951 12/05/15 2235 12/06/15 0454  WBC 7.2  --   --  5.8  CREATININE 1.49*  --   --  1.45*  LATICACIDVEN  --  0.9 0.9  --     Estimated Creatinine Clearance: 37.2 mL/min (by C-G formula based on Cr of 1.45).    Allergies  Allergen Reactions  . Ambrisentan Other (See Comments), Itching and Swelling  . Heparin     Made white blood count go down. HIT    Antimicrobials this admission: 5/25 >> Levaquin >> 5/26 5/26 >> Vanc >> 5/26 5/26 >> Unasyn >>  Dose adjustments this admission: -  Microbiology results: 5/25 BCx: sent 5/25 Sputum: sent 5/25 resp panel PCR: sent  5/25 strep pneumo ur ag: neg 5/25 legionella ur ag: IP 5/26 Rapid strep screen:  Thank you for allowing pharmacy to be a part of this patient's care.  Peggyann Juba, PharmD, BCPS Pager: [phone] 12/06/2015 10:58 AM

## 2015-12-06 NOTE — Care Management Note (Signed)
Case Management Note  Patient Details  Name: Luke Jacobson. MRN: [id number] Date of Birth: 1940-08-21  Subjective/Objective: 75 y/o m admitted w/hypoxia. From home. Has [facility] home 02-has travel tanks.PT cons-await recc.                   Action/Plan:d/c plan home.   Expected Discharge Date:   (unknown)               Expected Discharge Plan:  [place]  In-House Referral:     Discharge planning Services  CM Consult  Post Acute Care Choice:  Durable Medical Equipment (Active w/[facility] home 02-has travel tanks.) Choice offered to:     DME Arranged:    DME Agency:     HH Arranged:    HH Agency:     Status of Service:  In process, will continue to follow  Medicare Important Message Given:    Date Medicare IM Given:    Medicare IM give by:    Date Additional Medicare IM Given:    Additional Medicare Important Message give by:     If discussed at [place] of Stay Meetings, dates discussed:    Additional Comments:  Dessa Phi, RN 12/06/2015, 1:15 PM

## 2015-12-06 NOTE — Progress Notes (Signed)
Pharmacy Antibiotic Note  Luke Jacobson. is a 75 y.o. male admitted on 12/05/2015 with pneumonia.  Pharmacy has been consulted for Levofloxacin, vancomycin dosing.  Plan: Vancomycin 758m  IV every 24 hours.  Goal trough 15-20 mcg/mL.  Levofloxacin 7584miv q48hr  Height: _0  (170.2 cm) Weight: 127 lb 9.6 oz (57.879 kg) IBW/kg (Calculated) : 66.1  Temp (24hrs), Avg:99.9 F (37.7 C), Min:98.4 F (36.9 C), Max:101.1 F (38.4 C)   Recent Labs Lab 12/05/15 1725 12/05/15 1951 12/05/15 2235 12/06/15 0454  WBC 7.2  --   --  5.8  CREATININE 1.49*  --   --  1.45*  LATICACIDVEN  --  0.9 0.9  --     Estimated Creatinine Clearance: 37.2 mL/min (by C-G formula based on Cr of 1.45).    Allergies  Allergen Reactions  . Ambrisentan Other (See Comments), Itching and Swelling  . Heparin     Made white blood count go down. HIT    Antimicrobials this admission: Vancomycin 12/06/2015 >> Levofloxacin 12/05/2015 >>  Dose adjustments this admission: -  Microbiology results: Pending  Thank you for allowing pharmacy to be a part of this patient's care.  GrNani Skillernrowford 12/06/2015 6:21 AM

## 2015-12-06 NOTE — Progress Notes (Addendum)
Initial Nutrition Assessment  DOCUMENTATION CODES:   Severe malnutrition in context of chronic illness  INTERVENTION:  -Ensure Enlive po BID, each supplement provides 350 kcal and 20 grams of protein -RD to continue to monitor  NUTRITION DIAGNOSIS:   Malnutrition related to chronic illness as evidenced by severe depletion of body fat, severe depletion of muscle mass.  GOAL:   Patient will meet greater than or equal to 90% of their needs  MONITOR:   PO intake, Supplement acceptance, Labs, I & O's, Skin  REASON FOR ASSESSMENT:   Malnutrition Screening Tool    ASSESSMENT:   Luke Jacobson. is a 75 y.o. gentleman with a history of heart transplant in 1996 on immunosuppressant therapy, COPD on oxygen 6L [place] at baseline, HTN, dyslipidemia, and HIT who reports a one week history of recurrent fever, sore throat, cough productive of white/tan sputum (no hemoptysis), and malaise with decreased PO intake. He was evaluated in the outpatient setting and given a prescription for azithromycin. Symptoms have persisted.  Spoke with Luke Jacobson at bedside.  He endorses poor appetite for 8 days PTA r/t sore throat.  States that his throat was so sore "I wasn't sure if food would get down."  During this time span he didn't eat solid foods, but drank soda, boost, and ate chicken noodle soup. Endorses that he lost a few pounds during this time span as well, but nothing substantial. Per chart he exhibits a 6#/4.5% insignificant wt loss in 2 months. Wt has been stable for 3 years otherwise. He was in the process of ordering a tray during my visit, throat is feeling a little better, and appetite is improving. If unable to tolerate, SLP eval would be appropriate. Was likely not meeting his needs with sore throat PTA. Will monitor PO intake during stay.  Nutrition-Focused physical exam completed. Findings are severe fat depletion, severe muscle depletion, and no edema.   Labs and medications  reviewed: BUN 38, Cr 1.45, EGFR 46; Multivitamin with minerals, MgO 466m PO 5x daily   Diet Order:  Diet Heart Room service appropriate?: Yes; Fluid consistency:: Thin  Skin:  Reviewed, no issues  Last BM:  5/25  Height:   Ht Readings from Last 1 Encounters:  12/05/15 _0  (1.702 m)    Weight:   Wt Readings from Last 1 Encounters:  12/05/15 127 lb 9.6 oz (57.879 kg)    Ideal Body Weight:  67.72 kg  BMI:  Body mass index is 19.98 kg/(m^2).  Estimated Nutritional Needs:   Kcal:  1450-1750 calories  Protein:  55-70 grams  Fluid:  >/= 1.5L  EDUCATION NEEDS:   No education needs identified at this time  WSatira Anis Kerria Sapien, MS, RD LDN Inpatient Clinical Dietitian Pager [phone]

## 2015-12-07 DIAGNOSIS — R0902 Hypoxemia: Secondary | ICD-10-CM

## 2015-12-07 LAB — BASIC METABOLIC PANEL
ANION GAP: 5 (ref 5–15)
BUN: 38 mg/dL — AB (ref 6–20)
CALCIUM: 8.6 mg/dL — AB (ref 8.9–10.3)
CO2: 27 mmol/L (ref 22–32)
Chloride: 107 mmol/L (ref 101–111)
Creatinine, Ser: 1.48 mg/dL — ABNORMAL HIGH (ref 0.61–1.24)
GFR calc Af Amer: 52 mL/min — ABNORMAL LOW (ref >60)
GFR calc non Af Amer: 45 mL/min — ABNORMAL LOW (ref >60)
GLUCOSE: 104 mg/dL — AB (ref 65–99)
Potassium: 4.2 mmol/L (ref 3.5–5.1)
Sodium: 139 mmol/L (ref 135–145)

## 2015-12-07 LAB — CBC
HEMATOCRIT: 37.9 % — AB (ref 39.0–52.0)
Hemoglobin: 11.9 g/dL — ABNORMAL LOW (ref 13.0–17.0)
MCH: 29.2 pg (ref 26.0–34.0)
MCHC: 31.4 g/dL (ref 30.0–36.0)
MCV: 93.1 fL (ref 78.0–100.0)
PLATELETS: 85 10*3/uL — AB (ref 150–400)
RBC: 4.07 MIL/uL — ABNORMAL LOW (ref 4.22–5.81)
RDW: 14.1 % (ref 11.5–15.5)
WBC: 5 10*3/uL (ref 4.0–10.5)

## 2015-12-07 MED ORDER — HEPARIN SODIUM (PORCINE) 5000 UNIT/ML IJ SOLN: 5000.0000 [IU] | Freq: Three times a day (TID) | INTRAMUSCULAR | Status: DC

## 2015-12-07 MED ORDER — FONDAPARINUX SODIUM 2.5 MG/0.5ML ~~LOC~~ SOLN
2.5000 mg | Freq: Every day | SUBCUTANEOUS | Status: DC
  Administered 2015-12-07 – 2015-12-08 (×2): 2.5 mg via SUBCUTANEOUS
  Filled 2015-12-07 (×3): qty 0.5

## 2015-12-07 MED ORDER — SALINE SPRAY 0.65 % NA SOLN
1.0000 | NASAL | Status: DC | PRN
  Administered 2015-12-07: 1 via NASAL
  Filled 2015-12-07: qty 44

## 2015-12-07 NOTE — Evaluation (Signed)
Physical Therapy Evaluation Patient Details Name: Luke Jacobson. MRN: [id number] DOB: 04-Jul-1941 Today's Date: 12/07/2015   History of Present Illness  75 y.o. male cardiac transplantation in 1996 (follows at [facility]) on immunosuppressant therapy (Cellcept/Cyclosporin), COPD, Pul HTN, chronic hypoxic resp failure on home O2 6-7 L/m admitted for acute ulcerative supraglottitis  Clinical Impression  Pt admitted with above diagnosis. Pt currently with functional limitations due to the deficits listed below (see PT Problem List).  Pt will benefit from skilled PT to increase their independence and safety with mobility to allow discharge to the venue listed below.   Pt reports ambulating without assistive device and does not require breaks at baseline, and typically on 6L O2 at baseline.  Pt requiring increased rest breaks and increased oxygen supplementation during activity at this time (see below).  Pt declines HHPT as he feels he will improve as his supraglottitis improves.     Follow Up Recommendations Home health PT (pt politely declines)    Equipment Recommendations  None recommended by PT    Recommendations for Other Services       Precautions / Restrictions Precautions Precaution Comments: monitor sats, chronic O2 Restrictions Weight Bearing Restrictions: No      Mobility  Bed Mobility               General bed mobility comments: pt up in recliner on arrival  Transfers Overall transfer level: Needs assistance Equipment used: None Transfers: Sit to/from Stand Sit to Stand: Min guard         General transfer comment: pt requires UE assist and leans posteriorly against recliner to self assist upright  Ambulation/Gait Ambulation/Gait assistance: Min guard Ambulation Distance (Feet): 240 Feet Assistive device: None (pushed IV pole) Gait Pattern/deviations: Step-through pattern;Decreased stride length     General Gait Details: slow pace, reports weakness,  multiple short standing rest breaks for breathing, SpO2 mostly 84-86% on 8L however dropped to 77% upon returning to room so increased O2 briefly to 10L for recovery and pt on 8L O2 at 90% upon leaving room  Stairs            Wheelchair Mobility    Modified Rankin (Stroke Patients Only)       Balance                                             Pertinent Vitals/Pain Pain Assessment: No/denies pain    Home Living Family/patient expects to be discharged to:: Private residence Living Arrangements: Spouse/significant other   Type of Home: House       Home Layout: Able to live on main level with bedroom/bathroom Home Equipment: None Additional Comments: chronic 6L O2 [place]    Prior Function Level of Independence: Independent               Hand Dominance        Extremity/Trunk Assessment               Lower Extremity Assessment: Generalized weakness         Communication   Communication: No difficulties  Cognition Arousal/Alertness: Awake/alert Behavior During Therapy: WFL for tasks assessed/performed Overall Cognitive Status: Within Functional Limits for tasks assessed                      General Comments      Exercises  Assessment/Plan    PT Assessment Patient needs continued PT services  PT Diagnosis Difficulty walking   PT Problem List Decreased strength;Decreased activity tolerance;Decreased mobility;Cardiopulmonary status limiting activity  PT Treatment Interventions DME instruction;Gait training;Functional mobility training;Patient/family education;Therapeutic activities;Therapeutic exercise   PT Goals (Current goals can be found in the Care Plan section) Acute Rehab PT Goals PT Goal Formulation: With patient Time For Goal Achievement: 12/14/15 Potential to Achieve Goals: Good    Frequency Min 3X/week   Barriers to discharge        Co-evaluation               End of Session Equipment  Utilized During Treatment: Oxygen Activity Tolerance: Patient limited by fatigue Patient left: in chair;with call bell/phone within reach;with nursing/sitter in room           Time: 1349-1409 PT Time Calculation (min) (ACUTE ONLY): 20 min   Charges:   PT Evaluation $PT Eval Low Complexity: 1 Procedure     PT G Codes:        Dustin Burrill,KATHrine E 12/07/2015, 2:25 PM Carmelia Bake, PT, DPT 12/07/2015 Pager: [phone]

## 2015-12-07 NOTE — Progress Notes (Signed)
CCMD alerted pt. Had 16 beat run of SVT. Pt. Asleep and stable. MD made aware, will continue to monitor and carry out any new orders

## 2015-12-07 NOTE — Progress Notes (Signed)
PROGRESS NOTE        PATIENT DETAILS Name: Luke Jacobson. Age: 75 y.o. Sex: male Date of Birth: 13-Apr-1941 Admit Date: 12/05/2015 Admitting Physician Lily Kocher, MD OMQ:[id number] W, MD  Brief Narrative:  Patient is a 75 y.o. male cardiac transplantation in 1996 (follows at [facility]) on immunosuppressant therapy (Cellcept/Cyclosporin), COPD, Pul HTN, chronic hypoxic resp failure on home O2 6-7 L/m admitted with 7-10 day hx of fever, sore throat and cough. Has completed a course of amoxicillin and zithromax as outpatient with no relief. On admission found to be febrile, CT Neck suggestive of supra glottitis. Subsequently admitted with empiric Abx.   Subjective:  Patient in bed, denies any fever or chills, no headache, no chest or abdominal pain. Shortness of breath has slightly improved. No throat pain.  Assessment/Plan:  Acute ulcerative Supra glottitis: ongoing for the past 4-10 days- currently on Unasyn, case discussed by previous M.D. with ID physician Dr. Drucilla Schmidt. Also seen by ENT. Laryngoscopy showed a small nonspecific ulcer on supraglottis, per ENT continue antibiotics and supportive care, clinically better. Continue to monitor. Need outpatient ENT follow-up for biopsy results.   Advanced baseline COPD: not wheezing-moving air well-appears stable-continue Bronchodilator regimen, continue oxygen. Uses 6-7 L oxygen at home.  Pul HTN: continue Tadalafil-Recent Echo at [facility] 4/11-showed RVSP at 72 mmHg.  Chronic Hypoxic Resp Failure: continue O2 supplementation-on 6-7 L/m at home.   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 Continue ASA/Plavix, statin.Recent Echo at [facility] 4/11 showed preserved EF with severe LVH  CKD stage 3: creatinine close to usual baseline (reviewed labs in care everywhere), follow    DVT Prophylaxis: Heparin   Code Status: Full code   Family Communication: None at  bedside  Disposition Plan: Remain inpatient-requires 1-2 more days of additional hospitalization  Antimicrobial agents: IV Unasyn 5/26>> IV Vancomycin 5/25>>5/26 IV Levaquin 5/25>>5/26  Procedures: Laryngoscopy showing a small ulcer on the supraglottis  CONSULTS:  ENT  Time spent: 25 minutes-Greater than 50% of this time was spent in counseling, explanation of diagnosis, planning of further management, and coordination of care.  MEDICATIONS: Anti-infectives    Start     Dose/Rate Route Frequency Ordered Stop   12/07/15 2000  levofloxacin (LEVAQUIN) IVPB 750 mg  Status:  Discontinued     750 mg 100 mL/hr over 90 Minutes Intravenous Every 48 hours 12/06/15 0619 12/06/15 1037   12/06/15 2100  levofloxacin (LEVAQUIN) IVPB 750 mg  Status:  Discontinued     750 mg 100 mL/hr over 90 Minutes Intravenous Every 24 hours 12/05/15 2204 12/06/15 0619   12/06/15 1200  Ampicillin-Sulbactam (UNASYN) 3 g in sodium chloride 0.9 % 100 mL IVPB     3 g 100 mL/hr over 60 Minutes Intravenous Every 6 hours 12/06/15 1053     12/06/15 0245  vancomycin (VANCOCIN) IVPB 750 mg/150 ml premix  Status:  Discontinued     750 mg 150 mL/hr over 60 Minutes Intravenous Daily at bedtime 12/06/15 0234 12/06/15 1037   12/05/15 2000  levofloxacin (LEVAQUIN) IVPB 750 mg     750 mg 100 mL/hr over 90 Minutes Intravenous  Once 12/05/15 1949 12/05/15 2142      Scheduled Meds: . ampicillin-sulbactam (UNASYN) IV  3 g Intravenous Q6H  . aspirin EC  81 mg Oral QHS  . clopidogrel  75 mg Oral Daily  .  cycloSPORINE modified  75 mg Oral BID  . feeding supplement (ENSURE ENLIVE)  237 mL Oral BID BM  . fluticasone furoate-vilanterol  1 puff Inhalation Daily  . guaiFENesin  600 mg Oral BID  . ipratropium-albuterol  3 mL Nebulization QID  . loratadine  10 mg Oral Daily  . magnesium oxide  400 mg Oral 5 X Daily  . multivitamin with minerals  1 tablet Oral Daily  . mycophenolate  1,500 mg Oral BID  . pantoprazole  40 mg  Oral Daily  . simvastatin  40 mg Oral Daily  . Tadalafil (PAH)  40 mg Oral Daily   Continuous Infusions:  PRN Meds:.acetaminophen, albuterol, sodium chloride, temazepam   PHYSICAL EXAM: Vital signs: Filed Vitals:   12/06/15 2042 12/07/15 0522 12/07/15 0731 12/07/15 0732  BP: 128/74 138/69    Pulse: 79 74    Temp: 98.2 F (36.8 C) 98.5 F (36.9 C)    TempSrc: Oral Oral    Resp: 22 22    Height:      Weight:      SpO2: 95% 92% 93% 93%   Filed Weights   12/05/15 1724 12/05/15 2205  Weight: 57.153 kg (126 lb) 57.879 kg (127 lb 9.6 oz)   Body mass index is 19.98 kg/(m^2).   Gen Exam: Awake and alert with clear speech. Not in any distress Neck: Supple, No JVD.   Chest: B/L Clear.   CVS: S1 S2 Regular, no murmurs.  Abdomen: soft, BS +, non tender, non distended.  Extremities: no edema, lower extremities warm to touch. Neurologic: Non Focal.   Skin: No Rash or lesions   Wounds: N/A.    LABORATORY DATA: CBC:  Recent Labs Lab 12/05/15 1725 12/06/15 0454 12/07/15 0525  WBC 7.2 5.8 5.0  NEUTROABS 5.7  --   --   HGB 13.8 11.5* 11.9*  HCT 41.0 36.4* 37.9*  MCV 90.3 93.3 93.1  PLT 103* 84* 85*    Basic Metabolic Panel:  Recent Labs Lab 12/05/15 1725 12/06/15 0454 12/07/15 0525  NA 137 137 139  K 4.2 4.1 4.2  CL 104 106 107  CO2 _0 GLUCOSE 111* 122* 104*  BUN 37* 38* 38*  CREATININE 1.49* 1.45* 1.48*  CALCIUM 8.9 8.5* 8.6*    GFR: Estimated Creatinine Clearance: 36.4 mL/min (by C-G formula based on Cr of 1.48).  Liver Function Tests: No results for input(s): AST, ALT, ALKPHOS, BILITOT, PROT, ALBUMIN in the last 168 hours. No results for input(s): LIPASE, AMYLASE in the last 168 hours. No results for input(s): AMMONIA in the last 168 hours.  Coagulation Profile: No results for input(s): INR, PROTIME in the last 168 hours.  Cardiac Enzymes: No results for input(s): CKTOTAL, CKMB, CKMBINDEX, TROPONINI in the last 168 hours.  BNP (last 3  results) No results for input(s): PROBNP in the last 8760 hours.  HbA1C: No results for input(s): HGBA1C in the last 72 hours.  CBG: No results for input(s): GLUCAP in the last 168 hours.  Lipid Profile: No results for input(s): CHOL, HDL, LDLCALC, TRIG, CHOLHDL, LDLDIRECT in the last 72 hours.  Thyroid Function Tests: No results for input(s): TSH, T4TOTAL, FREET4, T3FREE, THYROIDAB in the last 72 hours.  Anemia Panel: No results for input(s): VITAMINB12, FOLATE, FERRITIN, TIBC, IRON, RETICCTPCT in the last 72 hours.  Urine analysis:    Component Value Date/Time   COLORURINE YELLOW 12/05/2015 1933   APPEARANCEUR CLEAR 12/05/2015 1933   LABSPEC 1.024 12/05/2015 1933   PHURINE  6.0 12/05/2015 1933   GLUCOSEU NEGATIVE 12/05/2015 1933   HGBUR NEGATIVE 12/05/2015 1933   BILIRUBINUR NEGATIVE 12/05/2015 [place] NEGATIVE 12/05/2015 1933   PROTEINUR 30* 12/05/2015 1933   UROBILINOGEN 1.0 03/10/2012 1603   NITRITE NEGATIVE 12/05/2015 1933   LEUKOCYTESUR NEGATIVE 12/05/2015 1933    Sepsis Labs: Lactic Acid, Venous    Component Value Date/Time   LATICACIDVEN 0.9 12/05/2015 2235    MICROBIOLOGY: Recent Results (from the past 240 hour(s))  Blood culture (routine x 2)     Status: None (Preliminary result)   Collection Time: 12/05/15  9:49 PM  Result Value Ref Range Status   Specimen Description RIGHT ANTECUBITAL  Final   Special Requests BOTTLES DRAWN AEROBIC AND ANAEROBIC 5CC  Final   Culture   Final    NO GROWTH 2 DAYS Performed at [facility]    Report Status PENDING  Incomplete  Blood culture (routine x 2)     Status: None (Preliminary result)   Collection Time: 12/05/15  9:49 PM  Result Value Ref Range Status   Specimen Description BLOOD RIGHT HAND  Final   Special Requests BOTTLES DRAWN AEROBIC ONLY 5CC  Final   Culture   Final    NO GROWTH 2 DAYS Performed at [facility]    Report Status PENDING  Incomplete  Respiratory Panel by PCR      Status: None   Collection Time: 12/05/15 11:55 PM  Result Value Ref Range Status   Adenovirus NOT DETECTED NOT DETECTED Final   Coronavirus 229E NOT DETECTED NOT DETECTED Final   Coronavirus HKU1 NOT DETECTED NOT DETECTED Final   Coronavirus NL63 NOT DETECTED NOT DETECTED Final   Coronavirus OC43 NOT DETECTED NOT DETECTED Final   Metapneumovirus NOT DETECTED NOT DETECTED Final   Rhinovirus / Enterovirus NOT DETECTED NOT DETECTED Final   Influenza A NOT DETECTED NOT DETECTED Final   Influenza A H1 NOT DETECTED NOT DETECTED Final   Influenza A H1 2009 NOT DETECTED NOT DETECTED Final   Influenza A H3 NOT DETECTED NOT DETECTED Final   Influenza B NOT DETECTED NOT DETECTED Final   Parainfluenza Virus 1 NOT DETECTED NOT DETECTED Final   Parainfluenza Virus 2 NOT DETECTED NOT DETECTED Final   Parainfluenza Virus 3 NOT DETECTED NOT DETECTED Final   Parainfluenza Virus 4 NOT DETECTED NOT DETECTED Final   Respiratory Syncytial Virus NOT DETECTED NOT DETECTED Final   Bordetella pertussis NOT DETECTED NOT DETECTED Final   Chlamydophila pneumoniae NOT DETECTED NOT DETECTED Final   Mycoplasma pneumoniae NOT DETECTED NOT DETECTED Final  Culture, sputum-assessment     Status: None   Collection Time: 12/06/15  1:34 AM  Result Value Ref Range Status   Specimen Description SPUTUM  Final   Special Requests Immunocompromised  Final   Sputum evaluation   Final    THIS SPECIMEN IS ACCEPTABLE. RESPIRATORY CULTURE REPORT TO FOLLOW.   Report Status 12/06/2015 FINAL  Final  Culture, respiratory (NON-Expectorated)     Status: None (Preliminary result)   Collection Time: 12/06/15  1:34 AM  Result Value Ref Range Status   Specimen Description SPUTUM  Final   Special Requests NONE  Final   Gram Stain   Final    ABUNDANT WBC PRESENT, PREDOMINANTLY PMN MODERATE GRAM NEGATIVE RODS FEW GRAM POSITIVE COCCI IN PAIRS    Culture   Final    CULTURE REINCUBATED FOR BETTER GROWTH Performed at [facility]     Report Status PENDING  Incomplete  Rapid strep screen (not at [facility])     Status: None   Collection Time: 12/06/15 11:53 AM  Result Value Ref Range Status   Streptococcus, Group A Screen (Direct) NEGATIVE NEGATIVE Final    Comment: (NOTE) A Rapid Antigen test may result negative if the antigen level in the sample is below the detection level of this test. The FDA has not cleared this test as a stand-alone test therefore the rapid antigen negative result has reflexed to a Group A Strep culture.     RADIOLOGY STUDIES/RESULTS: Ct Soft Tissue Neck W Contrast  12/05/2015  CLINICAL DATA:  Neck pain, headache, and difficulty swallowing for 5 days. Fever. Assess for retropharyngeal abscess. EXAM: CT NECK WITH CONTRAST TECHNIQUE: Multidetector CT imaging of the neck was performed using the standard protocol following the bolus administration of intravenous contrast. CONTRAST:  75 mL Isovue 300 COMPARISON:  None. FINDINGS: Pharynx and larynx: Examination limited by respiratory motion artifact and metallic dental streak artifact. No tonsillar asymmetry, pharyngeal mass, or retropharyngeal fluid collection identified. Appearance of mild supraglottic/hypopharyngeal soft tissue thickening diffusely with slight thickening of the epiglottis. Airway is widely patent. Salivary glands: Salivary and parotid glands are unremarkable within limitations of artifact. Thyroid: Unremarkable. Lymph nodes: Scattered subcentimeter anterior cervical lymph nodes bilaterally including a 9 mm left level IV lymph node. Vascular: Major vascular structures of the neck appear patent. Mild right and moderate left carotid bifurcation atherosclerosis without evidence of significant stenosis. Limited intracranial: Unremarkable. Visualized orbits: Suspected bilateral cataract extraction, incompletely visualized. Mastoids and visualized paranasal sinuses: Clear. Skeleton: Advanced multilevel disc degeneration in the lower cervical spine. Upper  chest: Visualized lung apices are grossly clear with evidence of emphysema. IMPRESSION: 1. Motion degraded examination. No evidence of retropharyngeal abscess. 2. Suggestion of mild supraglottic/hypopharyngeal swelling which may reflect upper respiratory infection/ supraglottitis. 3. Scattered subcentimeter cervical lymph nodes, nonspecific but most likely reactive. Electronically Signed   By: Logan Bores M.D.   On: 12/05/2015 19:11   Dg Chest Port 1 View  12/05/2015  CLINICAL DATA:  Neck pain with headache and difficulty swallowing for 5 days. Fever and cough. History of heart transplant. EXAM: PORTABLE CHEST 1 VIEW COMPARISON:  01/17/2015 and 06/13/2013. FINDINGS: 1654 hours. The heart size and mediastinal contours are stable status post median sternotomy. There is aortic atherosclerosis. Compared with the prior studies, there is increased interstitial prominence throughout the lungs. Blunting of both costophrenic angles is unchanged. There is no confluent airspace opacity or pneumothorax. Fractures of the upper sternotomy wires is unchanged. No acute osseous findings are seen. IMPRESSION: Progressive interstitial prominence in the lungs may reflect edema, atypical infection or developing interstitial lung disease. Short term radiographic follow up recommended. If persistent, high-resolution chest CT may be helpful. Electronically Signed   By: Richardean Sale M.D.   On: 12/05/2015 17:11     LOS: 1 day   Thurnell Lose, MD  [facility] Pager:[phone]  If 7PM-7AM, please contact night-coverage [email or web address] Password [facility] 12/07/2015, 10:19 AM

## 2015-12-07 NOTE — Progress Notes (Signed)
PT Cancellation Note  Patient Details Name: Luke Jacobson. MRN: [id number] DOB: 10-22-1940   Cancelled Treatment:    Reason Eval/Treat Not Completed: Fatigue/lethargy limiting ability to participate (pt just OOB to recliner and requested a break before ambulating)   Inmer Nix,KATHrine E 12/07/2015, 9:51 AM Carmelia Bake, PT, DPT 12/07/2015 Pager: [phone]

## 2015-12-08 LAB — LEGIONELLA PNEUMOPHILA SEROGP 1 UR AG: L. PNEUMOPHILA SEROGP 1 UR AG: NEGATIVE

## 2015-12-08 MED ORDER — AMOXICILLIN-POT CLAVULANATE 875-125 MG PO TABS
1.0000 | ORAL_TABLET | Freq: Two times a day (BID) | ORAL | Status: DC
  Administered 2015-12-08: 1 via ORAL
  Filled 2015-12-08: qty 1

## 2015-12-08 MED ORDER — AMOXICILLIN-POT CLAVULANATE 875-125 MG PO TABS: 1.0000 | ORAL_TABLET | Freq: Two times a day (BID) | ORAL | Status: DC

## 2015-12-08 NOTE — Care Management Note (Addendum)
Case Management Note  Patient Details  Name: Luke Jacobson. MRN: [id number] Date of Birth: 06-04-1941  Subjective/Objective:  Hypoxia, hx heart transplant                   Action/Plan: Discharge Planning: AVS reviewed:  NCM spoke to pt and wife, Luke Jacobson. Spoke to wife and pt used [facility] for [facility]. Has oxygen from [facility]. Sent referral to [facility] for [facility] for scheduled dc today.  PCP Moreen Fowler, DAVID MD  Expected Discharge Date: 12/08/2015             Expected Discharge Plan:  [place]  In-House Referral:  n/a  Discharge planning Services  CM Consult  Post Acute Care Choice:  Durable Medical Equipment (Active w/[facility] home 02-has travel tanks.) Choice offered to:  wife  DME Arranged:  n/a DME Agency:  n/a  HH Arranged:  RN, PT, aide [place] Agency:  [place]   Status of Service:  Complete   Medicare Important Message Given:    Date Medicare IM Given:    Medicare IM give by:    Date Additional Medicare IM Given:    Additional Medicare Important Message give by:     If discussed at [place] of Stay Meetings, dates discussed:    Additional Comments:  Erenest Rasher, RN 12/08/2015, 1:07 PM

## 2015-12-08 NOTE — Progress Notes (Signed)
Continues to be on 8.5l/m [place] and remains mostly 91% to 95%.  Occasionally wild drop to mid 80's but will not sustain.  Temp this AM is 99.1.

## 2015-12-08 NOTE — Discharge Summary (Signed)
Luke Bluemel., is a 75 y.o. male  DOB Mar 12, 1941  MRN [id number].  Admission date:  12/05/2015  Admitting Physician  Lily Kocher, MD  Discharge Date:  12/08/2015   Primary MD  Gara Kroner, MD  Recommendations for primary care physician for things to follow:   Check CBC BMP along with a two-view chest x-ray in 3-5 days.  Outpatient pulmonary and ENT follow-up.   Admission Diagnosis  SOB   Discharge Diagnosis  Acute ulcerative Supra glottitis  Principal Problem:   Hypoxia Active Problems:   CAD (coronary artery disease)   H/O heart transplant ([place])   Hypertension   Pulmonary hypertension (HCC)   CAP (community acquired pneumonia)   URI (upper respiratory infection)   AKI (acute kidney injury) ([place])      Past Medical History  Diagnosis Date  . Hypertension   . Shortness of breath   . Hypercholesteremia   . HIT (heparin-induced thrombocytopenia) ([place])   . COPD (chronic obstructive pulmonary disease) ([place])   . AAA (abdominal aortic aneurysm) ([place])   . Arrhythmia has had episodes of fast heart rate    Past Surgical History  Procedure Laterality Date  . Heart transplant    . Heart stents  Sept 2016    2 stents       HPI  from the history and physical done on the day of admission:    Patient is a 75 y.o. male cardiac transplantation in 1996 (follows at [facility]) on immunosuppressant therapy (Cellcept/Cyclosporin), COPD, Pul HTN, chronic hypoxic resp failure on home O2 6-7 L/m admitted with 7-10 day hx of fever, sore throat and cough. Has completed a course of amoxicillin and zithromax as outpatient with no relief. On admission found to be febrile, CT Neck suggestive of supra glottitis. Subsequently admitted with empiric Abx.    Hospital Course:     Acute ulcerative Supra glottitis: ongoing  for the past 4-10 days- Had failed outpatient antibiotic but improved remarkably on IV Unasyn, case discussed by previous M.D. with ID physician Dr. Drucilla Schmidt who agreed with the management.   He was subsequently seen by ENT. Laryngoscopy showed a small nonspecific ulcer on supraglottis, per ENT continued with antibiotics and supportive care, and tinea to improve clinically and this morning he is feeling back to his baseline and eager to go home, no shortness of breath or throat discomfort, tolerating diet, no stridor. He placed him on 5 more days of oral Augmentin with outpatient follow-up with PCP and ENT. Need outpatient ENT follow-up for biopsy results.   Advanced baseline COPD: not wheezing-moving air well-appears stable-continue Bronchodilator regimen, continue oxygen. Uses 6-7 L oxygen at home. At baseline follow with Dr. Karl Bales primary pulmonologist post discharge.  Pul HTN: continue Tadalafil-Recent Echo at [facility] 4/11-showed RVSP at 72 mmHg.  Chronic Hypoxic Resp Failure: continue O2 supplementation-on 6-7 L/m at home.   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 Continue ASA/Plavix, statin. Recent Echo at [facility] 4/11 showed preserved EF with severe LVH. No  acute issues this admission.  CKD stage 3: creatinine close to usual baseline (reviewed labs in care everywhere), follow     Follow UP  Follow-up Information    Follow up with [facility], MD. Schedule an appointment as soon as possible for a visit in 1 week.   Specialty:  Family Medicine   Contact information:   [address] [place] [address] [phone]       Follow up with Melissa Montane, MD. Schedule an appointment as soon as possible for a visit in 1 week.   Specialty:  Otolaryngology   Contact information:   [address] [place] [address] [phone]       Follow up with Rigoberto Noel., MD. Schedule an appointment as soon as  possible for a visit in 1 week.   Specialty:  Pulmonary Disease   Contact information:   [address] [id number] [phone]        Consults obtained -  ENT  Discharge Condition: Fair  Diet and Activity recommendation: See Discharge Instructions below  Discharge Instructions       Discharge Instructions    Diet - low sodium heart healthy    Complete by:  As directed      Discharge instructions    Complete by:  As directed   Follow with Primary MD Gara Kroner, MD in 7 days   Get CBC, CMP, 2 view Chest X ray checked  by Primary MD next visit.    Activity: As tolerated with Full fall precautions use walker/cane & assistance as needed   Disposition Home    Diet:   Heart Healthy   with feeding assistance and aspiration precautions.  For Heart failure patients - Check your Weight same time everyday, if you gain over 2 pounds, or you develop in leg swelling, experience more shortness of breath or chest pain, call your Primary MD immediately. Follow Cardiac Low Salt Diet and 1.5 lit/day fluid restriction.   On your next visit with your primary care physician please Get Medicines reviewed and adjusted.   Please request your Prim.MD to go over all Hospital Tests and Procedure/Radiological results at the follow up, please get all Hospital records sent to your Prim MD by signing hospital release before you go home.   If you experience worsening of your admission symptoms, develop shortness of breath, life threatening emergency, suicidal or homicidal thoughts you must seek medical attention immediately by calling 911 or calling your MD immediately  if symptoms less severe.  You Must read complete instructions/literature along with all the possible adverse reactions/side effects for all the Medicines you take and that have been prescribed to you. Take any new Medicines after you have completely understood and accpet all the possible adverse reactions/side effects.    Do not drive, operate heavy machinery, perform activities at heights, swimming or participation in water activities or provide baby sitting services if your were admitted for syncope or siezures until you have seen by Primary MD or a Neurologist and advised to do so again.  Do not drive when taking Pain medications.    Do not take more than prescribed Pain, Sleep and Anxiety Medications  Special Instructions: If you have smoked or chewed Tobacco  in the last 2 yrs please stop smoking, stop any regular Alcohol  and or any Recreational drug use.  Wear Seat belts while driving.   Please note  You were cared for by a hospitalist  during your hospital stay. If you have any questions about your discharge medications or the care you received while you were in the hospital after you are discharged, you can call the unit and asked to speak with the hospitalist on call if the hospitalist that took care of you is not available. Once you are discharged, your primary care physician will handle any further medical issues. Please note that NO REFILLS for any discharge medications will be authorized once you are discharged, as it is imperative that you return to your primary care physician (or establish a relationship with a primary care physician if you do not have one) for your aftercare needs so that they can reassess your need for medications and monitor your lab values.     Increase activity slowly    Complete by:  As directed              Discharge Medications       Medication List    STOP taking these medications        azithromycin 250 MG tablet  Commonly known as:  ZITHROMAX      TAKE these medications        acetaminophen 500 MG tablet  Commonly known as:  TYLENOL  Take 500-1,000 mg by mouth daily.     ADCIRCA 20 MG Tabs  Generic drug:  Tadalafil (PAH)  Take 40 mg by mouth daily.     albuterol 108 (90 Base) MCG/ACT inhaler  Commonly known as:  PROAIR HFA  Inhale 2 puffs  into the lungs every 6 (six) hours as needed.     amoxicillin-clavulanate 875-125 MG tablet  Commonly known as:  AUGMENTIN  Take 1 tablet by mouth every 12 (twelve) hours.     aspirin EC 81 MG tablet  Take 81 mg by mouth at bedtime.     clopidogrel 75 MG tablet  Commonly known as:  PLAVIX  Take 75 mg by mouth daily.     CO Q10 PO  Take 500 mg by mouth daily.     cycloSPORINE modified 25 MG capsule  Commonly known as:  NEORAL  Take 75 mg by mouth 2 (two) times daily.     fexofenadine 180 MG tablet  Commonly known as:  ALLEGRA  Take 180 mg by mouth daily.     Fish Oil 1000 MG Caps  Take 1 capsule by mouth every evening.     fluticasone furoate-vilanterol 100-25 MCG/INH Aepb  Commonly known as:  BREO ELLIPTA  Inhale 1 puff into the lungs daily.     furosemide 20 MG tablet  Commonly known as:  LASIX  Take 40 mg by mouth daily as needed for fluid or edema.     losartan 50 MG tablet  Commonly known as:  COZAAR  Take 50 mg by mouth daily with supper.     magnesium oxide 400 MG tablet  Commonly known as:  MAG-OX  Take 400 mg by mouth 5 (five) times daily.     multivitamin tablet  Take 1 tablet by mouth daily.     mycophenolate 500 MG tablet  Commonly known as:  CELLCEPT  Take 1,500 mg by mouth 2 (two) times daily.     omeprazole 40 MG capsule  Commonly known as:  PRILOSEC  Take 40 mg by mouth daily.     simvastatin 40 MG tablet  Commonly known as:  ZOCOR  Take 40 mg by mouth daily.     temazepam 15 MG capsule  Commonly known as:  RESTORIL  Take 30 mg by mouth at bedtime as needed. For sleep     tiotropium 18 MCG inhalation capsule  Commonly known as:  SPIRIVA HANDIHALER  INHALE CONTENTS OF 1 CAPSULE VIA HANDIHALER ONCE DAILY AS DIRECTED        Major procedures and Radiology Reports - PLEASE review detailed and final reports for all details, in brief -   Laryngoscopy showing a small ulcer on the supraglottis   Ct Soft Tissue Neck W  Contrast  12/05/2015  CLINICAL DATA:  Neck pain, headache, and difficulty swallowing for 5 days. Fever. Assess for retropharyngeal abscess. EXAM: CT NECK WITH CONTRAST TECHNIQUE: Multidetector CT imaging of the neck was performed using the standard protocol following the bolus administration of intravenous contrast. CONTRAST:  75 mL Isovue 300 COMPARISON:  None. FINDINGS: Pharynx and larynx: Examination limited by respiratory motion artifact and metallic dental streak artifact. No tonsillar asymmetry, pharyngeal mass, or retropharyngeal fluid collection identified. Appearance of mild supraglottic/hypopharyngeal soft tissue thickening diffusely with slight thickening of the epiglottis. Airway is widely patent. Salivary glands: Salivary and parotid glands are unremarkable within limitations of artifact. Thyroid: Unremarkable. Lymph nodes: Scattered subcentimeter anterior cervical lymph nodes bilaterally including a 9 mm left level IV lymph node. Vascular: Major vascular structures of the neck appear patent. Mild right and moderate left carotid bifurcation atherosclerosis without evidence of significant stenosis. Limited intracranial: Unremarkable. Visualized orbits: Suspected bilateral cataract extraction, incompletely visualized. Mastoids and visualized paranasal sinuses: Clear. Skeleton: Advanced multilevel disc degeneration in the lower cervical spine. Upper chest: Visualized lung apices are grossly clear with evidence of emphysema. IMPRESSION: 1. Motion degraded examination. No evidence of retropharyngeal abscess. 2. Suggestion of mild supraglottic/hypopharyngeal swelling which may reflect upper respiratory infection/ supraglottitis. 3. Scattered subcentimeter cervical lymph nodes, nonspecific but most likely reactive. Electronically Signed   By: Logan Bores M.D.   On: 12/05/2015 19:11   Dg Chest Port 1 View  12/05/2015  CLINICAL DATA:  Neck pain with headache and difficulty swallowing for 5 days. Fever and  cough. History of heart transplant. EXAM: PORTABLE CHEST 1 VIEW COMPARISON:  01/17/2015 and 06/13/2013. FINDINGS: 1654 hours. The heart size and mediastinal contours are stable status post median sternotomy. There is aortic atherosclerosis. Compared with the prior studies, there is increased interstitial prominence throughout the lungs. Blunting of both costophrenic angles is unchanged. There is no confluent airspace opacity or pneumothorax. Fractures of the upper sternotomy wires is unchanged. No acute osseous findings are seen. IMPRESSION: Progressive interstitial prominence in the lungs may reflect edema, atypical infection or developing interstitial lung disease. Short term radiographic follow up recommended. If persistent, high-resolution chest CT may be helpful. Electronically Signed   By: Richardean Sale M.D.   On: 12/05/2015 17:11    Micro Results      Recent Results (from the past 240 hour(s))  Blood culture (routine x 2)     Status: None (Preliminary result)   Collection Time: 12/05/15  9:49 PM  Result Value Ref Range Status   Specimen Description RIGHT ANTECUBITAL  Final   Special Requests BOTTLES DRAWN AEROBIC AND ANAEROBIC 5CC  Final   Culture   Final    NO GROWTH 2 DAYS Performed at [facility]    Report Status PENDING  Incomplete  Blood culture (routine x 2)     Status: None (Preliminary result)   Collection Time: 12/05/15  9:49 PM  Result Value Ref Range Status   Specimen Description BLOOD RIGHT HAND  Final   Special Requests BOTTLES DRAWN AEROBIC ONLY 5CC  Final   Culture   Final    NO GROWTH 2 DAYS Performed at [facility]    Report Status PENDING  Incomplete  Respiratory Panel by PCR     Status: None   Collection Time: 12/05/15 11:55 PM  Result Value Ref Range Status   Adenovirus NOT DETECTED NOT DETECTED Final   Coronavirus 229E NOT DETECTED NOT DETECTED Final   Coronavirus HKU1 NOT DETECTED NOT DETECTED Final   Coronavirus NL63 NOT DETECTED NOT  DETECTED Final   Coronavirus OC43 NOT DETECTED NOT DETECTED Final   Metapneumovirus NOT DETECTED NOT DETECTED Final   Rhinovirus / Enterovirus NOT DETECTED NOT DETECTED Final   Influenza A NOT DETECTED NOT DETECTED Final   Influenza A H1 NOT DETECTED NOT DETECTED Final   Influenza A H1 2009 NOT DETECTED NOT DETECTED Final   Influenza A H3 NOT DETECTED NOT DETECTED Final   Influenza B NOT DETECTED NOT DETECTED Final   Parainfluenza Virus 1 NOT DETECTED NOT DETECTED Final   Parainfluenza Virus 2 NOT DETECTED NOT DETECTED Final   Parainfluenza Virus 3 NOT DETECTED NOT DETECTED Final   Parainfluenza Virus 4 NOT DETECTED NOT DETECTED Final   Respiratory Syncytial Virus NOT DETECTED NOT DETECTED Final   Bordetella pertussis NOT DETECTED NOT DETECTED Final   Chlamydophila pneumoniae NOT DETECTED NOT DETECTED Final   Mycoplasma pneumoniae NOT DETECTED NOT DETECTED Final  Culture, sputum-assessment     Status: None   Collection Time: 12/06/15  1:34 AM  Result Value Ref Range Status   Specimen Description SPUTUM  Final   Special Requests Immunocompromised  Final   Sputum evaluation   Final    THIS SPECIMEN IS ACCEPTABLE. RESPIRATORY CULTURE REPORT TO FOLLOW.   Report Status 12/06/2015 FINAL  Final  Culture, respiratory (NON-Expectorated)     Status: None (Preliminary result)   Collection Time: 12/06/15  1:34 AM  Result Value Ref Range Status   Specimen Description SPUTUM  Final   Special Requests NONE  Final   Gram Stain   Final    ABUNDANT WBC PRESENT, PREDOMINANTLY PMN MODERATE GRAM NEGATIVE RODS FEW GRAM POSITIVE COCCI IN PAIRS    Culture   Final    MODERATE PSEUDOMONAS AERUGINOSA SUSCEPTIBILITIES TO FOLLOW Performed at [facility]    Report Status PENDING  Incomplete  Rapid strep screen (not at [facility])     Status: None   Collection Time: 12/06/15 11:53 AM  Result Value Ref Range Status   Streptococcus, Group A Screen (Direct) NEGATIVE NEGATIVE Final    Comment: (NOTE) A  Rapid Antigen test may result negative if the antigen level in the sample is below the detection level of this test. The FDA has not cleared this test as a stand-alone test therefore the rapid antigen negative result has reflexed to a Group A Strep culture.   Culture, group A strep     Status: None (Preliminary result)   Collection Time: 12/06/15 11:53 AM  Result Value Ref Range Status   Specimen Description THROAT  Final   Special Requests NONE Reflexed from [id number]  Final   Culture   Final    CULTURE REINCUBATED FOR BETTER GROWTH Performed at [facility]    Report Status PENDING  Incomplete       Today   Subjective    Luke Jacobson today has no headache,no chest abdominal pain,no new weakness tingling or numbness, feels much better wants  to go home today.     Objective   Blood pressure 121/69, pulse 73, temperature 99.1 F (37.3 C), temperature source Oral, resp. rate 20, height 5' 7" (1.702 m), weight 57.879 kg (127 lb 9.6 oz), SpO2 97 %.   Intake/Output Summary (Last 24 hours) at 12/08/15 0911 Last data filed at 12/08/15 0500  Gross per 24 hour  Intake   1400 ml  Output   1400 ml  Net      0 ml    Exam Awake Alert, Oriented x 3, No new F.N deficits, Normal affect [place].AT,PERRAL Supple Neck,No JVD, No cervical lymphadenopathy appriciated.  Symmetrical Chest wall movement, Moderate to Good air movement bilaterally, CTAB RRR,No Gallops,Rubs or new Murmurs, No Parasternal Heave +ve B.Sounds, Abd Soft, Non tender, No organomegaly appriciated, No rebound -guarding or rigidity. No Cyanosis, Clubbing or edema, No new Rash or bruise   Data Review   CBC w Diff: Lab Results  Component Value Date   WBC 5.0 12/07/2015   HGB 11.9* 12/07/2015   HCT 37.9* 12/07/2015   PLT 85* 12/07/2015   LYMPHOPCT 10 12/05/2015   MONOPCT 11 12/05/2015   EOSPCT 0 12/05/2015   BASOPCT 0 12/05/2015    CMP: Lab Results  Component Value Date   NA 139 12/07/2015   K 4.2  12/07/2015   CL 107 12/07/2015   CO2 27 12/07/2015   BUN 38* 12/07/2015   CREATININE 1.48* 12/07/2015   CREATININE 1.26* 02/28/2015  .   Total Time in preparing paper work, data evaluation and todays exam - 35 minutes  Thurnell Lose M.D on 12/08/2015 at [facility]   Office  [phone]

## 2015-12-08 NOTE — Progress Notes (Addendum)
Pharmacy Antibiotic Note  Luke Thain. is a 75 y.o. male with hx heart transplant in 1996 admitted on 12/05/2015 with increased sputum production, sore throat, decreased PO intake, recurrent fever.  Unasyn started on admission for laryngitis/supra glottitis. To change abx to augmentin on 5/28 per MD's request.  - Tmax 99.1, wbc wnl, scr relatively stable 1.48 (crcl~36)   Plan: - augmentin 875 mg PO q12h - f/u renal function ____________________________   Height: _0  (170.2 cm) Weight: 127 lb 9.6 oz (57.879 kg) IBW/kg (Calculated) : 66.1  Temp (24hrs), Avg:98.6 F (37 C), Min:97.9 F (36.6 C), Max:99.1 F (37.3 C)   Recent Labs Lab 12/05/15 1725 12/05/15 1951 12/05/15 2235 12/06/15 0454 12/07/15 0525  WBC 7.2  --   --  5.8 5.0  CREATININE 1.49*  --   --  1.45* 1.48*  LATICACIDVEN  --  0.9 0.9  --   --     Estimated Creatinine Clearance: 36.4 mL/min (by C-G formula based on Cr of 1.48).    Allergies  Allergen Reactions  . Ambrisentan Other (See Comments), Itching and Swelling  . Heparin     Made white blood count go down. HIT    Antimicrobials this admission: 5/25 >> Levaquin >> 5/26 5/26 >> Vanc >> 5/26 5/26 >> Unasyn >>5/28 5/28 augmentin (pharyngitis)>>  Dose adjustments this admission: N/a  Microbiology results: 5/25 BCx: ngtd 5/25 Sputum: mod GNR, few GPC pairs, cx IP 5/25 resp panel PCR: neg 5/25 strep pneumo ur ag: neg 5/25 legionella ur ag: IP 5/26 Rapid strep screen: neg  Thank you for allowing pharmacy to be a part of this patient's care.  Dia Sitter, PharmD, BCPS 12/08/2015 7:41 AM

## 2015-12-08 NOTE — Discharge Instructions (Signed)
Follow with Primary MD Gara Kroner, MD in 7 days   Get CBC, CMP, 2 view Chest X ray checked  by Primary MD next visit.    Activity: As tolerated with Full fall precautions use walker/cane & assistance as needed   Disposition Home    Diet:   Heart Healthy   with feeding assistance and aspiration precautions.  For Heart failure patients - Check your Weight same time everyday, if you gain over 2 pounds, or you develop in leg swelling, experience more shortness of breath or chest pain, call your Primary MD immediately. Follow Cardiac Low Salt Diet and 1.5 lit/day fluid restriction.   On your next visit with your primary care physician please Get Medicines reviewed and adjusted.   Please request your Prim.MD to go over all Hospital Tests and Procedure/Radiological results at the follow up, please get all Hospital records sent to your Prim MD by signing hospital release before you go home.   If you experience worsening of your admission symptoms, develop shortness of breath, life threatening emergency, suicidal or homicidal thoughts you must seek medical attention immediately by calling 911 or calling your MD immediately  if symptoms less severe.  You Must read complete instructions/literature along with all the possible adverse reactions/side effects for all the Medicines you take and that have been prescribed to you. Take any new Medicines after you have completely understood and accpet all the possible adverse reactions/side effects.   Do not drive, operate heavy machinery, perform activities at heights, swimming or participation in water activities or provide baby sitting services if your were admitted for syncope or siezures until you have seen by Primary MD or a Neurologist and advised to do so again.  Do not drive when taking Pain medications.    Do not take more than prescribed Pain, Sleep and Anxiety Medications  Special Instructions: If you have smoked or chewed Tobacco  in  the last 2 yrs please stop smoking, stop any regular Alcohol  and or any Recreational drug use.  Wear Seat belts while driving.   Please note  You were cared for by a hospitalist during your hospital stay. If you have any questions about your discharge medications or the care you received while you were in the hospital after you are discharged, you can call the unit and asked to speak with the hospitalist on call if the hospitalist that took care of you is not available. Once you are discharged, your primary care physician will handle any further medical issues. Please note that NO REFILLS for any discharge medications will be authorized once you are discharged, as it is imperative that you return to your primary care physician (or establish a relationship with a primary care physician if you do not have one) for your aftercare needs so that they can reassess your need for medications and monitor your lab values.

## 2015-12-09 LAB — CULTURE, GROUP A STREP (THRC)

## 2015-12-09 LAB — CULTURE, RESPIRATORY W GRAM STAIN

## 2015-12-09 LAB — CULTURE, RESPIRATORY

## 2015-12-10 LAB — CULTURE, BLOOD (ROUTINE X 2)
CULTURE: NO GROWTH
CULTURE: NO GROWTH

## 2015-12-13 ENCOUNTER — Encounter (HOSPITAL_COMMUNITY): Payer: Self-pay

## 2015-12-13 ENCOUNTER — Inpatient Hospital Stay (HOSPITAL_COMMUNITY)
Admission: EM | Admit: 2015-12-13 | Discharge: 2015-12-18 | DRG: 177 | Disposition: A | Discharge status: Home / Self Care | Payer: Medicare Other | Attending: Internal Medicine | Admitting: Internal Medicine

## 2015-12-13 ENCOUNTER — Emergency Department (HOSPITAL_COMMUNITY): Payer: Medicare Other

## 2015-12-13 DIAGNOSIS — Z7902 Long term (current) use of antithrombotics/antiplatelets: Secondary | ICD-10-CM | POA: Diagnosis not present

## 2015-12-13 DIAGNOSIS — Y830 Surgical operation with transplant of whole organ as the cause of abnormal reaction of the patient, or of later complication, without mention of misadventure at the time of the procedure: Secondary | ICD-10-CM | POA: Diagnosis present

## 2015-12-13 DIAGNOSIS — I5032 Chronic diastolic (congestive) heart failure: Secondary | ICD-10-CM | POA: Diagnosis present

## 2015-12-13 DIAGNOSIS — N189 Chronic kidney disease, unspecified: Secondary | ICD-10-CM

## 2015-12-13 DIAGNOSIS — J440 Chronic obstructive pulmonary disease with acute lower respiratory infection: Secondary | ICD-10-CM | POA: Diagnosis present

## 2015-12-13 DIAGNOSIS — Z7951 Long term (current) use of inhaled steroids: Secondary | ICD-10-CM | POA: Diagnosis not present

## 2015-12-13 DIAGNOSIS — K2210 Ulcer of esophagus without bleeding: Secondary | ICD-10-CM | POA: Diagnosis present

## 2015-12-13 DIAGNOSIS — Z8249 Family history of ischemic heart disease and other diseases of the circulatory system: Secondary | ICD-10-CM | POA: Diagnosis not present

## 2015-12-13 DIAGNOSIS — K224 Dyskinesia of esophagus: Secondary | ICD-10-CM | POA: Diagnosis present

## 2015-12-13 DIAGNOSIS — J9811 Atelectasis: Secondary | ICD-10-CM | POA: Diagnosis not present

## 2015-12-13 DIAGNOSIS — I272 Other secondary pulmonary hypertension: Secondary | ICD-10-CM | POA: Diagnosis present

## 2015-12-13 DIAGNOSIS — I351 Nonrheumatic aortic (valve) insufficiency: Secondary | ICD-10-CM | POA: Diagnosis present

## 2015-12-13 DIAGNOSIS — Z9981 Dependence on supplemental oxygen: Secondary | ICD-10-CM | POA: Diagnosis not present

## 2015-12-13 DIAGNOSIS — Z87891 Personal history of nicotine dependence: Secondary | ICD-10-CM

## 2015-12-13 DIAGNOSIS — Z888 Allergy status to other drugs, medicaments and biological substances status: Secondary | ICD-10-CM | POA: Diagnosis not present

## 2015-12-13 DIAGNOSIS — K449 Diaphragmatic hernia without obstruction or gangrene: Secondary | ICD-10-CM | POA: Diagnosis present

## 2015-12-13 DIAGNOSIS — I10 Essential (primary) hypertension: Secondary | ICD-10-CM | POA: Diagnosis present

## 2015-12-13 DIAGNOSIS — I509 Heart failure, unspecified: Secondary | ICD-10-CM | POA: Diagnosis not present

## 2015-12-13 DIAGNOSIS — R066 Hiccough: Secondary | ICD-10-CM

## 2015-12-13 DIAGNOSIS — Y95 Nosocomial condition: Secondary | ICD-10-CM | POA: Diagnosis present

## 2015-12-13 DIAGNOSIS — I2510 Atherosclerotic heart disease of native coronary artery without angina pectoris: Secondary | ICD-10-CM | POA: Diagnosis present

## 2015-12-13 DIAGNOSIS — R1314 Dysphagia, pharyngoesophageal phase: Secondary | ICD-10-CM

## 2015-12-13 DIAGNOSIS — I5033 Acute on chronic diastolic (congestive) heart failure: Secondary | ICD-10-CM | POA: Diagnosis not present

## 2015-12-13 DIAGNOSIS — K210 Gastro-esophageal reflux disease with esophagitis: Secondary | ICD-10-CM | POA: Diagnosis present

## 2015-12-13 DIAGNOSIS — Z7982 Long term (current) use of aspirin: Secondary | ICD-10-CM | POA: Diagnosis not present

## 2015-12-13 DIAGNOSIS — IMO0001 Reserved for inherently not codable concepts without codable children: Secondary | ICD-10-CM | POA: Diagnosis present

## 2015-12-13 DIAGNOSIS — B3781 Candidal esophagitis: Secondary | ICD-10-CM | POA: Diagnosis present

## 2015-12-13 DIAGNOSIS — I25811 Atherosclerosis of native coronary artery of transplanted heart without angina pectoris: Secondary | ICD-10-CM | POA: Diagnosis present

## 2015-12-13 DIAGNOSIS — J189 Pneumonia, unspecified organism: Secondary | ICD-10-CM | POA: Diagnosis present

## 2015-12-13 DIAGNOSIS — Z79899 Other long term (current) drug therapy: Secondary | ICD-10-CM

## 2015-12-13 DIAGNOSIS — R1310 Dysphagia, unspecified: Secondary | ICD-10-CM | POA: Diagnosis not present

## 2015-12-13 DIAGNOSIS — J432 Centrilobular emphysema: Secondary | ICD-10-CM | POA: Diagnosis not present

## 2015-12-13 DIAGNOSIS — J849 Interstitial pulmonary disease, unspecified: Secondary | ICD-10-CM | POA: Diagnosis present

## 2015-12-13 DIAGNOSIS — T86298 Other complications of heart transplant: Secondary | ICD-10-CM | POA: Diagnosis present

## 2015-12-13 DIAGNOSIS — J9621 Acute and chronic respiratory failure with hypoxia: Secondary | ICD-10-CM | POA: Diagnosis present

## 2015-12-13 DIAGNOSIS — Z941 Heart transplant status: Secondary | ICD-10-CM

## 2015-12-13 DIAGNOSIS — J9620 Acute and chronic respiratory failure, unspecified whether with hypoxia or hypercapnia: Secondary | ICD-10-CM | POA: Diagnosis present

## 2015-12-13 DIAGNOSIS — J690 Pneumonitis due to inhalation of food and vomit: Principal | ICD-10-CM | POA: Diagnosis present

## 2015-12-13 DIAGNOSIS — R1110 Vomiting, unspecified: Secondary | ICD-10-CM

## 2015-12-13 DIAGNOSIS — R0602 Shortness of breath: Secondary | ICD-10-CM

## 2015-12-13 DIAGNOSIS — E785 Hyperlipidemia, unspecified: Secondary | ICD-10-CM | POA: Diagnosis present

## 2015-12-13 DIAGNOSIS — N179 Acute kidney failure, unspecified: Secondary | ICD-10-CM | POA: Diagnosis present

## 2015-12-13 DIAGNOSIS — N183 Chronic kidney disease, stage 3 unspecified: Secondary | ICD-10-CM | POA: Diagnosis present

## 2015-12-13 DIAGNOSIS — I130 Hypertensive heart and chronic kidney disease with heart failure and stage 1 through stage 4 chronic kidney disease, or unspecified chronic kidney disease: Secondary | ICD-10-CM | POA: Diagnosis present

## 2015-12-13 DIAGNOSIS — I714 Abdominal aortic aneurysm, without rupture: Secondary | ICD-10-CM | POA: Diagnosis present

## 2015-12-13 DIAGNOSIS — Z955 Presence of coronary angioplasty implant and graft: Secondary | ICD-10-CM

## 2015-12-13 DIAGNOSIS — R112 Nausea with vomiting, unspecified: Secondary | ICD-10-CM

## 2015-12-13 DIAGNOSIS — J151 Pneumonia due to Pseudomonas: Secondary | ICD-10-CM | POA: Diagnosis present

## 2015-12-13 LAB — CBC WITH DIFFERENTIAL/PLATELET
Basophils Absolute: 0 10*3/uL (ref 0.0–0.1)
Basophils Relative: 0 %
Eosinophils Absolute: 0.1 10*3/uL (ref 0.0–0.7)
Eosinophils Relative: 1 %
HCT: 40.6 % (ref 39.0–52.0)
Hemoglobin: 12.9 g/dL — ABNORMAL LOW (ref 13.0–17.0)
Lymphocytes Relative: 9 %
Lymphs Abs: 0.5 10*3/uL — ABNORMAL LOW (ref 0.7–4.0)
MCH: 29.6 pg (ref 26.0–34.0)
MCHC: 31.8 g/dL (ref 30.0–36.0)
MCV: 93.1 fL (ref 78.0–100.0)
Monocytes Absolute: 0.4 10*3/uL (ref 0.1–1.0)
Monocytes Relative: 7 %
Neutro Abs: 4.8 10*3/uL (ref 1.7–7.7)
Neutrophils Relative %: 83 %
Platelets: 169 10*3/uL (ref 150–400)
RBC: 4.36 MIL/uL (ref 4.22–5.81)
RDW: 13.8 % (ref 11.5–15.5)
WBC: 5.8 10*3/uL (ref 4.0–10.5)

## 2015-12-13 LAB — URINALYSIS, ROUTINE W REFLEX MICROSCOPIC
BILIRUBIN URINE: NEGATIVE
Glucose, UA: NEGATIVE mg/dL
Hgb urine dipstick: NEGATIVE
Ketones, ur: NEGATIVE mg/dL
LEUKOCYTES UA: NEGATIVE
NITRITE: NEGATIVE
Protein, ur: NEGATIVE mg/dL
SPECIFIC GRAVITY, URINE: 1.021 (ref 1.005–1.030)
pH: 6 (ref 5.0–8.0)

## 2015-12-13 LAB — COMPREHENSIVE METABOLIC PANEL
ALT: 19 U/L (ref 17–63)
AST: 26 U/L (ref 15–41)
Albumin: 3.2 g/dL — ABNORMAL LOW (ref 3.5–5.0)
Alkaline Phosphatase: 66 U/L (ref 38–126)
Anion gap: 6 (ref 5–15)
BUN: 31 mg/dL — ABNORMAL HIGH (ref 6–20)
CO2: 26 mmol/L (ref 22–32)
Calcium: 9 mg/dL (ref 8.9–10.3)
Chloride: 110 mmol/L (ref 101–111)
Creatinine, Ser: 1.31 mg/dL — ABNORMAL HIGH (ref 0.61–1.24)
GFR calc Af Amer: >60 mL/min (ref >60)
GFR calc non Af Amer: 52 mL/min — ABNORMAL LOW (ref >60)
Glucose, Bld: 111 mg/dL — ABNORMAL HIGH (ref 65–99)
Potassium: 4.1 mmol/L (ref 3.5–5.1)
Sodium: 142 mmol/L (ref 135–145)
Total Bilirubin: 1.1 mg/dL (ref 0.3–1.2)
Total Protein: 7.1 g/dL (ref 6.5–8.1)

## 2015-12-13 LAB — BRAIN NATRIURETIC PEPTIDE: B Natriuretic Peptide: 248.8 pg/mL — ABNORMAL HIGH (ref 0.0–100.0)

## 2015-12-13 LAB — EXPECTORATED SPUTUM ASSESSMENT W GRAM STAIN, RFLX TO RESP C

## 2015-12-13 LAB — MAGNESIUM: Magnesium: 2.1 mg/dL (ref 1.7–2.4)

## 2015-12-13 LAB — MRSA PCR SCREENING: MRSA by PCR: NEGATIVE

## 2015-12-13 LAB — I-STAT TROPONIN, ED: Troponin i, poc: 0.02 ng/mL (ref 0.00–0.08)

## 2015-12-13 MED ORDER — ONDANSETRON HCL 4 MG PO TABS
4.0000 mg | ORAL_TABLET | Freq: Four times a day (QID) | ORAL | Status: DC | PRN
  Filled 2015-12-13: qty 1

## 2015-12-13 MED ORDER — SODIUM CHLORIDE 0.9 % IV SOLN
25.0000 mg | Freq: Four times a day (QID) | INTRAVENOUS | Status: DC | PRN
  Administered 2015-12-13 – 2015-12-17 (×4): 25 mg via INTRAVENOUS
  Filled 2015-12-13 (×9): qty 1

## 2015-12-13 MED ORDER — SODIUM CHLORIDE 0.9 % IV SOLN
INTRAVENOUS | Status: AC
  Administered 2015-12-13: 17:00:00 via INTRAVENOUS

## 2015-12-13 MED ORDER — CETYLPYRIDINIUM CHLORIDE 0.05 % MT LIQD
7.0000 mL | Freq: Two times a day (BID) | OROMUCOSAL | Status: DC
  Administered 2015-12-13 – 2015-12-18 (×8): 7 mL via OROMUCOSAL

## 2015-12-13 MED ORDER — FUROSEMIDE 40 MG PO TABS: 40.0000 mg | ORAL_TABLET | Freq: Every day | ORAL | Status: DC | PRN

## 2015-12-13 MED ORDER — FLUTICASONE FUROATE-VILANTEROL 100-25 MCG/INH IN AEPB
1.0000 | INHALATION_SPRAY | Freq: Every day | RESPIRATORY_TRACT | Status: DC
Start: 2015-12-14 — End: 2015-12-18
  Administered 2015-12-14 – 2015-12-18 (×5): 1 via RESPIRATORY_TRACT
  Filled 2015-12-13: qty 28

## 2015-12-13 MED ORDER — TEMAZEPAM 15 MG PO CAPS
30.0000 mg | ORAL_CAPSULE | Freq: Every evening | ORAL | Status: DC | PRN
  Administered 2015-12-13 – 2015-12-17 (×5): 30 mg via ORAL
  Filled 2015-12-13 (×5): qty 2

## 2015-12-13 MED ORDER — PANTOPRAZOLE SODIUM 40 MG PO TBEC
40.0000 mg | DELAYED_RELEASE_TABLET | Freq: Every day | ORAL | Status: DC
  Administered 2015-12-13 – 2015-12-14 (×2): 40 mg via ORAL
  Filled 2015-12-13 (×2): qty 1

## 2015-12-13 MED ORDER — FLEET ENEMA 7-19 GM/118ML RE ENEM: 1.0000 | ENEMA | Freq: Once | RECTAL | Status: DC | PRN

## 2015-12-13 MED ORDER — ASPIRIN EC 81 MG PO TBEC
81.0000 mg | DELAYED_RELEASE_TABLET | Freq: Every day | ORAL | Status: DC
  Administered 2015-12-13 – 2015-12-17 (×5): 81 mg via ORAL
  Filled 2015-12-13 (×5): qty 1

## 2015-12-13 MED ORDER — CYCLOSPORINE MODIFIED (NEORAL) 25 MG PO CAPS
75.0000 mg | ORAL_CAPSULE | Freq: Two times a day (BID) | ORAL | Status: DC
  Administered 2015-12-13 – 2015-12-18 (×10): 75 mg via ORAL
  Filled 2015-12-13 (×11): qty 3

## 2015-12-13 MED ORDER — LORATADINE 10 MG PO TABS
10.0000 mg | ORAL_TABLET | Freq: Every day | ORAL | Status: DC
Start: 2015-12-13 — End: 2015-12-18
  Administered 2015-12-13 – 2015-12-18 (×6): 10 mg via ORAL
  Filled 2015-12-13 (×6): qty 1

## 2015-12-13 MED ORDER — OMEGA-3-ACID ETHYL ESTERS 1 G PO CAPS
1.0000 g | ORAL_CAPSULE | Freq: Every day | ORAL | Status: DC
  Administered 2015-12-13 – 2015-12-16 (×4): 1 g via ORAL
  Filled 2015-12-13 (×4): qty 1

## 2015-12-13 MED ORDER — SODIUM CHLORIDE 0.9% FLUSH
3.0000 mL | Freq: Two times a day (BID) | INTRAVENOUS | Status: DC
  Administered 2015-12-13 – 2015-12-18 (×8): 3 mL via INTRAVENOUS

## 2015-12-13 MED ORDER — CLOPIDOGREL BISULFATE 75 MG PO TABS
75.0000 mg | ORAL_TABLET | Freq: Every day | ORAL | Status: DC
  Administered 2015-12-13 – 2015-12-18 (×6): 75 mg via ORAL
  Filled 2015-12-13 (×5): qty 1

## 2015-12-13 MED ORDER — ACETAMINOPHEN 325 MG PO TABS
650.0000 mg | ORAL_TABLET | Freq: Four times a day (QID) | ORAL | Status: DC | PRN
  Filled 2015-12-13: qty 2

## 2015-12-13 MED ORDER — TADALAFIL (PAH) 20 MG PO TABS
40.0000 mg | ORAL_TABLET | Freq: Every day | ORAL | Status: DC
  Administered 2015-12-13 – 2015-12-18 (×4): 40 mg via ORAL

## 2015-12-13 MED ORDER — BUDESONIDE 0.25 MG/2ML IN SUSP
0.2500 mg | Freq: Two times a day (BID) | RESPIRATORY_TRACT | Status: DC
  Administered 2015-12-13 – 2015-12-16 (×6): 0.25 mg via RESPIRATORY_TRACT
  Filled 2015-12-13 (×6): qty 2

## 2015-12-13 MED ORDER — GUAIFENESIN ER 600 MG PO TB12
1200.0000 mg | ORAL_TABLET | Freq: Two times a day (BID) | ORAL | Status: DC
  Administered 2015-12-13 – 2015-12-18 (×10): 1200 mg via ORAL
  Filled 2015-12-13 (×10): qty 2

## 2015-12-13 MED ORDER — ONDANSETRON HCL 4 MG/2ML IJ SOLN
4.0000 mg | Freq: Four times a day (QID) | INTRAMUSCULAR | Status: DC | PRN
  Administered 2015-12-13 – 2015-12-17 (×2): 4 mg via INTRAVENOUS
  Filled 2015-12-13 (×2): qty 2

## 2015-12-13 MED ORDER — OXYCODONE HCL 5 MG PO TABS: 5.0000 mg | ORAL_TABLET | ORAL | Status: DC | PRN

## 2015-12-13 MED ORDER — ACETAMINOPHEN 650 MG RE SUPP
650.0000 mg | Freq: Four times a day (QID) | RECTAL | Status: DC | PRN
Start: 2015-12-13 — End: 2015-12-18

## 2015-12-13 MED ORDER — LORAZEPAM 2 MG/ML IJ SOLN
1.0000 mg | Freq: Once | INTRAMUSCULAR | Status: AC
  Administered 2015-12-13: 1 mg via INTRAVENOUS
  Filled 2015-12-13: qty 1

## 2015-12-13 MED ORDER — MAGNESIUM OXIDE 400 MG PO TABS
400.0000 mg | ORAL_TABLET | Freq: Every day | ORAL | Status: DC
  Administered 2015-12-13 – 2015-12-18 (×25): 400 mg via ORAL
  Filled 2015-12-13 (×50): qty 1

## 2015-12-13 MED ORDER — SIMVASTATIN 40 MG PO TABS
40.0000 mg | ORAL_TABLET | Freq: Every day | ORAL | Status: DC
  Administered 2015-12-13 – 2015-12-18 (×6): 40 mg via ORAL
  Filled 2015-12-13 (×6): qty 1

## 2015-12-13 MED ORDER — PIPERACILLIN-TAZOBACTAM 3.375 G IVPB
3.3750 g | Freq: Three times a day (TID) | INTRAVENOUS | Status: DC
  Administered 2015-12-13 – 2015-12-16 (×8): 3.375 g via INTRAVENOUS
  Filled 2015-12-13 (×8): qty 50

## 2015-12-13 MED ORDER — IPRATROPIUM-ALBUTEROL 0.5-2.5 (3) MG/3ML IN SOLN
3.0000 mL | Freq: Four times a day (QID) | RESPIRATORY_TRACT | Status: DC
  Administered 2015-12-13 – 2015-12-14 (×4): 3 mL via RESPIRATORY_TRACT
  Filled 2015-12-13 (×4): qty 3

## 2015-12-13 MED ORDER — IPRATROPIUM-ALBUTEROL 0.5-2.5 (3) MG/3ML IN SOLN
3.0000 mL | RESPIRATORY_TRACT | Status: DC | PRN
  Administered 2015-12-17: 3 mL via RESPIRATORY_TRACT
  Filled 2015-12-13: qty 3

## 2015-12-13 MED ORDER — MYCOPHENOLATE MOFETIL 250 MG PO CAPS
1500.0000 mg | ORAL_CAPSULE | Freq: Two times a day (BID) | ORAL | Status: DC
  Administered 2015-12-13 – 2015-12-18 (×10): 1500 mg via ORAL
  Filled 2015-12-13 (×12): qty 6

## 2015-12-13 MED ORDER — LOSARTAN POTASSIUM 50 MG PO TABS
50.0000 mg | ORAL_TABLET | Freq: Every day | ORAL | Status: DC
  Administered 2015-12-13 – 2015-12-14 (×2): 50 mg via ORAL
  Filled 2015-12-13 (×3): qty 1

## 2015-12-13 MED ORDER — SORBITOL 70 % SOLN
30.0000 mL | Freq: Every day | Status: DC | PRN
  Filled 2015-12-13: qty 30

## 2015-12-13 MED ORDER — SENNOSIDES-DOCUSATE SODIUM 8.6-50 MG PO TABS: 1.0000 | ORAL_TABLET | Freq: Every evening | ORAL | Status: DC | PRN

## 2015-12-13 MED ORDER — PIPERACILLIN-TAZOBACTAM 3.375 G IVPB
3.3750 g | Freq: Once | INTRAVENOUS | Status: AC
  Administered 2015-12-13: 3.375 g via INTRAVENOUS
  Filled 2015-12-13: qty 50

## 2015-12-13 NOTE — ED Notes (Signed)
Per EMS, pt from home.  Pt here on Sunday and dx with pneumonia.  Sent home on antibiotic.  Pt continued with shortness of breath and nausea.  Pt found to have ra sats 81%.  NRB upper 90%.  No fever at home noted.  500cc NS in IV rt hand 20g.  42m zofran in route.  Vitals:  158/94, hr 71, resp 99% on NRB, resp 23

## 2015-12-13 NOTE — ED Provider Notes (Signed)
CSN: [id number]     Arrival date & time 12/13/15  1015 History   First MD Initiated Contact with Patient 12/13/15 1019     Chief Complaint  Patient presents with  . Shortness of Breath  . Pneumonia     (Consider location/radiation/quality/duration/timing/severity/associated sxs/prior Treatment) HPI Comments:  Patient is a 75 year old gentleman with a history of heart transplant in 1997 with stents to the heart in 2016, COPD, AAA, recent hospitalization and discharge 4 days ago after community-acquired pneumonia and supraglottic ulcer sent home to complete a 5 day course of Augmentin who has one day left presenting with hiccups and vomiting. He states last night he developed hiccups that have caused recurrent nausea and vomiting. He also is regurgitating some of his saliva and states he has a globus sensation in his throat. However the sensation has not changed since going home from the hospital. The pain is better and he denies any new fevers. He denies any choking sensation and until this morning has been able to swallow his medications. They called EMS because they did not think they had enough oxygen to transport by private vehicle. When EMS arrived he was satting 65% on room air was placed on a nonrebreather. Patient has had an ongoing cough which is unchanged since hospital discharge. No new lower extremity edema, abdominal pain or chest pain.  Patient is a 75 y.o. male presenting with shortness of breath and pneumonia. The history is provided by the patient.  Shortness of Breath Pneumonia Associated symptoms include shortness of breath.    Past Medical History  Diagnosis Date  . Hypertension   . Shortness of breath   . Hypercholesteremia   . HIT (heparin-induced thrombocytopenia) ([place])   . COPD (chronic obstructive pulmonary disease) ([place])   . AAA (abdominal aortic aneurysm) ([place])   . Arrhythmia has had episodes of fast heart rate   Past Surgical History  Procedure Laterality Date   . Heart transplant    . Heart stents  Sept 2016    2 stents   Family History  Problem Relation Age of Onset  . Heart attack Father   . Heart disease Mother   . Rheum arthritis Mother   . Rheum arthritis Paternal Grandfather   . Cancer Paternal Grandfather    Social History  Substance Use Topics  . Smoking status: Former Smoker -- 1.00 packs/day for 30 years    Types: Cigarettes    Quit date: 03/10/1994  . Smokeless tobacco: Never Used  . Alcohol Use: Yes     Comment: social    Review of Systems  Respiratory: Positive for shortness of breath.   All other systems reviewed and are negative.     Allergies  Ambrisentan and Heparin  Home Medications   Prior to Admission medications   Medication Sig Start Date End Date Taking? Authorizing Provider  acetaminophen (TYLENOL) 500 MG tablet Take 500-1,000 mg by mouth daily as needed for fever.    Yes Historical Provider, MD  ADCIRCA 20 MG TABS Take 40 mg by mouth daily.    Yes Historical Provider, MD  albuterol (PROAIR HFA) 108 (90 Base) MCG/ACT inhaler Inhale 2 puffs into the lungs every 6 (six) hours as needed. 09/18/15  Yes Rigoberto Noel, MD  amoxicillin-clavulanate (AUGMENTIN) 875-125 MG tablet Take 1 tablet by mouth every 12 (twelve) hours. 12/08/15  Yes Thurnell Lose, MD  aspirin EC 81 MG tablet Take 81 mg by mouth at bedtime.    Yes Historical Provider, MD  clopidogrel (PLAVIX) 75 MG tablet Take 75 mg by mouth daily. 10/17/15  Yes Historical Provider, MD  Coenzyme Q10 (CO Q10 PO) Take 500 mg by mouth daily.   Yes Historical Provider, MD  cycloSPORINE modified (NEORAL) 25 MG capsule Take 75 mg by mouth 2 (two) times daily.    Yes Historical Provider, MD  fexofenadine (ALLEGRA) 180 MG tablet Take 180 mg by mouth daily.   Yes Historical Provider, MD  fluticasone furoate-vilanterol (BREO ELLIPTA) 100-25 MCG/INH AEPB Inhale 1 puff into the lungs daily. 11/12/15  Yes Rigoberto Noel, MD  furosemide (LASIX) 20 MG tablet Take 40 mg by  mouth daily as needed for fluid or edema.    Yes Historical Provider, MD  losartan (COZAAR) 50 MG tablet Take 50 mg by mouth daily with supper. 12/02/15  Yes Historical Provider, MD  magnesium oxide (MAG-OX) 400 MG tablet Take 400 mg by mouth 5 (five) times daily.    Yes Historical Provider, MD  Multiple Vitamin (MULTIVITAMIN) tablet Take 1 tablet by mouth daily.   Yes Historical Provider, MD  mycophenolate (CELLCEPT) 500 MG tablet Take 1,500 mg by mouth 2 (two) times daily.   Yes Historical Provider, MD  Omega-3 Fatty Acids (FISH OIL) 1000 MG CAPS Take 1 capsule by mouth every evening.   Yes Historical Provider, MD  omeprazole (PRILOSEC) 40 MG capsule Take 40 mg by mouth daily.   Yes Historical Provider, MD  simvastatin (ZOCOR) 40 MG tablet Take 40 mg by mouth daily.   Yes Historical Provider, MD  temazepam (RESTORIL) 15 MG capsule Take 30 mg by mouth at bedtime as needed. For sleep   Yes Historical Provider, MD  tiotropium (SPIRIVA HANDIHALER) 18 MCG inhalation capsule INHALE CONTENTS OF 1 CAPSULE VIA HANDIHALER ONCE DAILY AS DIRECTED 07/16/15  Yes Rigoberto Noel, MD   BP 143/82 mmHg  Pulse 74  Temp(Src) 98.8 F (37.1 C) (Rectal)  Resp 22  SpO2 65% Physical Exam  Constitutional: He is oriented to person, place, and time. He appears well-developed and well-nourished. No distress.  Ongoing hiccups on exam with several episodes of spitting up saliva  HENT:  Head: Normocephalic and atraumatic.  Mouth/Throat: Oropharynx is clear and moist. Mucous membranes are dry.  Inspection of the pharynx appears normal  Eyes: Conjunctivae and EOM are normal. Pupils are equal, round, and reactive to light.  Neck: Normal range of motion. Neck supple.  Cardiovascular: Normal rate, regular rhythm and intact distal pulses.   No murmur heard. Pulmonary/Chest: Effort normal. No respiratory distress. He has no wheezes. He has rales in the right lower field and the left lower field.  Abdominal: Soft. He exhibits no  distension. There is no tenderness. There is no rebound and no guarding.  Musculoskeletal: Normal range of motion. He exhibits edema. He exhibits no tenderness.  2+ pitting edema in bilateral ankles  Neurological: He is alert and oriented to person, place, and time.  Skin: Skin is warm and dry. No rash noted. No erythema.  Psychiatric: He has a normal mood and affect. His behavior is normal.  Nursing note and vitals reviewed.   ED Course  Procedures (including critical care time) Labs Review Labs Reviewed  CBC WITH DIFFERENTIAL/PLATELET - Abnormal; Notable for the following:    Hemoglobin 12.9 (*)    Lymphs Abs 0.5 (*)    All other components within normal limits  COMPREHENSIVE METABOLIC PANEL - Abnormal; Notable for the following:    Glucose, Bld 111 (*)    BUN 31 (*)  Creatinine, Ser 1.31 (*)    Albumin 3.2 (*)    GFR calc non Af Amer 52 (*)    All other components within normal limits  BRAIN NATRIURETIC PEPTIDE - Abnormal; Notable for the following:    B Natriuretic Peptide 248.8 (*)    All other components within normal limits  I-STAT TROPOININ, ED    Imaging Review Dg Chest 2 View  12/13/2015  CLINICAL DATA:  Worsening cough and shortness of breath.  Vomiting. EXAM: CHEST  2 VIEW COMPARISON:  12/05/2015 and 01/17/2015 FINDINGS: Chronic blunting at the right costophrenic angle. Prior median sternotomy. Stable appearance of the heart and mediastinum. Heart size is upper limits of normal. Interstitial prominence from the prior examination is less conspicuous. There continues to be prominent densities at the right lung base which may be chronic. Mild degenerative changes in the thoracic spine. IMPRESSION: Patchy densities at the right lung base are nonspecific. This could represent acute on chronic disease. Difficult to exclude a subtle infectious etiology at this location. Electronically Signed   By: Markus Daft M.D.   On: 12/13/2015 11:09   I have personally reviewed and  evaluated these images and lab results as part of my medical decision-making.   EKG Interpretation   Date/Time:  Friday December 13 2015 10:30:53 EDT Ventricular Rate:  73 PR Interval:  161 QRS Duration: 92 QT Interval:  423 QTC Calculation: 466 R Axis:   59 Text Interpretation:  Sinus rhythm Probable left atrial enlargement  Anteroseptal infarct, age indeterminate No significant change since last  tracing Confirmed by Maryan Rued  MD, Loree Fee ([id number]) on 12/13/2015 10:40:48 AM      MDM   Final diagnoses:  Regurgitation  HCAP (healthcare-associated pneumonia)  Hiccups    Patient is a 75 year old male with multiple medical problems including prior heart transplant who was recently hospitalized and discharged 4 days ago after having fever, concern for pneumonia, supraglottic ulceration thought to be infectious and discharged home with 5 days of Augmentin which today will be his last day. Presenting today with hiccups and emesis/regurgitation starting last night. Patient states he's not been able to hold anything down and he initially states that he can tolerate his saliva however states he is regurgitating a significant amount of saliva. No further fevers. He has been taking his antirejection medication except for this morning. When EMS arrived patient's oxygen saturation was low which improved with the nonrebreather. He states that he was running out of oxygen at home and his wife called EMS because they did not have enough oxygen to transport him to the hospital by POV.  He has had an ongoing cough but no new swelling or chest pain. He denies any abdominal pain. On exam patient has mild rales bilaterally in the lung fields with some mild pitting edema in the lower extremities which is chronic. No abdominal pain. No stridor or respiratory distress at this time. Visual inspection of the pharynx is within normal limits.  Denies choking on any food or issues right after swallowing food. He continues to  have hiccups on exam.  EKG without new changes.  Chest x-ray, CBC, CMP, BNP, troponin pending. Patient was given Zofran in route without improvement in his nausea so he was given Ativan in hopes to improve the nausea and hiccups.  12:01 PM Labs and imaging without significant new findings. After Ativan hiccups have improved and nausea. He is still regurgitating saliva. Will initially discuss with ENT and GI.  Pt admitted to hospitalist.  Also covered with zosyn for concern for hcap or aspiration.  Blanchie Dessert, MD 12/13/15 1513

## 2015-12-13 NOTE — Progress Notes (Addendum)
Admission date: 12/05/2015 Admitting Physician Lily Kocher, MD Discharge Date: 12/08/2015  Primary MD Gara Kroner, MD  Admission Diagnosis SOB Discharge Diagnosis Acute ulcerative Supra glottitis  Principal Problem:  Hypoxia Active Problems:  CAD (coronary artery disease)  H/O heart transplant ([place])  Hypertension  Pulmonary hypertension (HCC)  CAP (community acquired pneumonia)  URI (upper respiratory infection)  AKI (acute kidney injury) ([place])  Post Acute Care Choice: Durable Medical Equipment (Active w/[facility] home 02-has travel tanks.) Support system: wife  HH Arranged: Therapist, sports, PT, aide [place]: [place] with 1 admission and 1 ED visit in last 6 months noted in EPIC

## 2015-12-13 NOTE — ED Notes (Addendum)
Gave pt water to sip on, pt states "it does not hurt but it feels like it gets stuck and is uncomfortable"

## 2015-12-13 NOTE — Consult Note (Signed)
[facility] Consultation Note  Referring Provider: [facility] Primary Care Physician:  Gara Kroner, MD  Reason for Consultation:  Nausea and vomiting  HPI: Luke Jacobson. is a 75 y.o. male recently discharged from hospital with, among other problems, supraglottitis requiring parenteral therapy.  Has heart transplant on immunosuppressives and CAD of transplanted heart on clopidigrel  .Was recently discharged on oral antibiotics, but began having recurrent and progressive globus sensation, upper throat "tightness" while swallowing, pain in throat.  Presented with shortness of breath and hypoxia, xray showing possible pneumonia, has improved.  Once liquid traverses the throat, he has no odynophagia or dysphagia.  Has had severe hiccoughs for the past couple days.  And, in midst of cough and hiccoughs, he will develop nausea and vomiting.  Thinks he may of had an endoscopy many years ago; he is unsure why or when.   Past Medical History  Diagnosis Date  . Hypertension   . Shortness of breath   . Hypercholesteremia   . HIT (heparin-induced thrombocytopenia) ([place])   . COPD (chronic obstructive pulmonary disease) ([place])   . AAA (abdominal aortic aneurysm) ([place])   . Arrhythmia has had episodes of fast heart rate    Past Surgical History  Procedure Laterality Date  . Heart transplant    . Heart stents  Sept 2016    2 stents    Prior to Admission medications   Medication Sig Start Date End Date Taking? Authorizing Provider  acetaminophen (TYLENOL) 500 MG tablet Take 500-1,000 mg by mouth daily as needed for fever.    Yes Historical Provider, MD  ADCIRCA 20 MG TABS Take 40 mg by mouth daily.    Yes Historical Provider, MD  albuterol (PROAIR HFA) 108 (90 Base) MCG/ACT inhaler Inhale 2 puffs into the lungs every 6 (six) hours as needed. 09/18/15  Yes Rigoberto Noel, MD  amoxicillin-clavulanate (AUGMENTIN) 875-125 MG tablet Take 1 tablet by mouth every 12 (twelve) hours.  12/08/15  Yes Thurnell Lose, MD  aspirin EC 81 MG tablet Take 81 mg by mouth at bedtime.    Yes Historical Provider, MD  clopidogrel (PLAVIX) 75 MG tablet Take 75 mg by mouth daily. 10/17/15  Yes Historical Provider, MD  Coenzyme Q10 (CO Q10 PO) Take 500 mg by mouth daily.   Yes Historical Provider, MD  cycloSPORINE modified (NEORAL) 25 MG capsule Take 75 mg by mouth 2 (two) times daily.    Yes Historical Provider, MD  fexofenadine (ALLEGRA) 180 MG tablet Take 180 mg by mouth daily.   Yes Historical Provider, MD  fluticasone furoate-vilanterol (BREO ELLIPTA) 100-25 MCG/INH AEPB Inhale 1 puff into the lungs daily. 11/12/15  Yes Rigoberto Noel, MD  furosemide (LASIX) 20 MG tablet Take 40 mg by mouth daily as needed for fluid or edema.    Yes Historical Provider, MD  losartan (COZAAR) 50 MG tablet Take 50 mg by mouth daily with supper. 12/02/15  Yes Historical Provider, MD  magnesium oxide (MAG-OX) 400 MG tablet Take 400 mg by mouth 5 (five) times daily.    Yes Historical Provider, MD  Multiple Vitamin (MULTIVITAMIN) tablet Take 1 tablet by mouth daily.   Yes Historical Provider, MD  mycophenolate (CELLCEPT) 500 MG tablet Take 1,500 mg by mouth 2 (two) times daily.   Yes Historical Provider, MD  Omega-3 Fatty Acids (FISH OIL) 1000 MG CAPS Take 1 capsule by mouth every evening.   Yes Historical Provider, MD  omeprazole (PRILOSEC) 40 MG capsule Take 40 mg  by mouth daily.   Yes Historical Provider, MD  simvastatin (ZOCOR) 40 MG tablet Take 40 mg by mouth daily.   Yes Historical Provider, MD  temazepam (RESTORIL) 15 MG capsule Take 30 mg by mouth at bedtime as needed. For sleep   Yes Historical Provider, MD  tiotropium (SPIRIVA HANDIHALER) 18 MCG inhalation capsule INHALE CONTENTS OF 1 CAPSULE VIA HANDIHALER ONCE DAILY AS DIRECTED 07/16/15  Yes Rigoberto Noel, MD    No current facility-administered medications for this encounter.   Current Outpatient Prescriptions  Medication Sig Dispense Refill  .  acetaminophen (TYLENOL) 500 MG tablet Take 500-1,000 mg by mouth daily as needed for fever.     . ADCIRCA 20 MG TABS Take 40 mg by mouth daily.   1  . albuterol (PROAIR HFA) 108 (90 Base) MCG/ACT inhaler Inhale 2 puffs into the lungs every 6 (six) hours as needed. 1 Inhaler 3  . amoxicillin-clavulanate (AUGMENTIN) 875-125 MG tablet Take 1 tablet by mouth every 12 (twelve) hours. 10 tablet 0  . aspirin EC 81 MG tablet Take 81 mg by mouth at bedtime.     . clopidogrel (PLAVIX) 75 MG tablet Take 75 mg by mouth daily.  11  . Coenzyme Q10 (CO Q10 PO) Take 500 mg by mouth daily.    . cycloSPORINE modified (NEORAL) 25 MG capsule Take 75 mg by mouth 2 (two) times daily.     . fexofenadine (ALLEGRA) 180 MG tablet Take 180 mg by mouth daily.    . fluticasone furoate-vilanterol (BREO ELLIPTA) 100-25 MCG/INH AEPB Inhale 1 puff into the lungs daily. 60 each 0  . furosemide (LASIX) 20 MG tablet Take 40 mg by mouth daily as needed for fluid or edema.     Marland Kitchen losartan (COZAAR) 50 MG tablet Take 50 mg by mouth daily with supper.    . magnesium oxide (MAG-OX) 400 MG tablet Take 400 mg by mouth 5 (five) times daily.     . Multiple Vitamin (MULTIVITAMIN) tablet Take 1 tablet by mouth daily.    . mycophenolate (CELLCEPT) 500 MG tablet Take 1,500 mg by mouth 2 (two) times daily.    . Omega-3 Fatty Acids (FISH OIL) 1000 MG CAPS Take 1 capsule by mouth every evening.    Marland Kitchen omeprazole (PRILOSEC) 40 MG capsule Take 40 mg by mouth daily.    . simvastatin (ZOCOR) 40 MG tablet Take 40 mg by mouth daily.    . temazepam (RESTORIL) 15 MG capsule Take 30 mg by mouth at bedtime as needed. For sleep    . tiotropium (SPIRIVA HANDIHALER) 18 MCG inhalation capsule INHALE CONTENTS OF 1 CAPSULE VIA HANDIHALER ONCE DAILY AS DIRECTED 90 capsule 3    Allergies as of 12/13/2015 - Review Complete 12/13/2015  Allergen Reaction Noted  . Ambrisentan Other (See Comments), Itching, and Swelling 12/05/2015  . Heparin  03/08/2012    Family  History  Problem Relation Age of Onset  . Heart attack Father   . Heart disease Mother   . Rheum arthritis Mother   . Rheum arthritis Paternal Grandfather   . Cancer Paternal Grandfather     Social History   Social History  . Marital Status: Single    Spouse Name: N/A  . Number of Children: N/A  . Years of Education: N/A   Occupational History  . 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 Smoking status: Former Smoker -- 1.00 packs/day for 30 years  Types: Cigarettes    Quit date: 03/10/1994  . Smokeless tobacco: Never Used  . Alcohol Use: Yes     Comment: social  . Drug Use: No  . Sexual Activity: Not on file   Other Topics Concern  . Not on file   Social History Narrative    Review of Systems: Positive = bold Gen: Denies any fever, chills, rigors, night sweats, anorexia, fatigue, weakness, malaise, involuntary weight loss, and sleep disorder CV: Denies chest pain, angina, palpitations, syncope, orthopnea, PND, peripheral edema, and claudication. Resp: Denies dyspnea, cough, sputum, wheezing, coughing up blood. GI: Described in detail in HPI.    GU : Denies urinary burning, blood in urine, urinary frequency, urinary hesitancy, nocturnal urination, and urinary incontinence. MS: Denies joint pain or swelling.  Denies muscle weakness, cramps, atrophy.  Derm: Denies rash, itching, oral ulcerations, hives, unhealing ulcers.  Psych: Denies depression, anxiety, memory loss, suicidal ideation, hallucinations,  and confusion. Heme: Denies bruising, bleeding, and enlarged lymph nodes. Neuro:  Denies any headaches, dizziness, paresthesias. Endo:  Denies any problems with DM, thyroid, adrenal function.  Physical Exam: Vital signs in last 24 hours: Temp:  [98.8 F (37.1 C)] 98.8 F (37.1 C) (06/02 1025) Pulse Rate:  [68-79] 72 (06/02 1512) Resp:  [18-32] 29 (06/02 1512) BP: (118-160)/(68-84) 144/68 mmHg (06/02 1512) SpO2:  [65  %-100 %] 90 % (06/02 1512)   General:   Alert, tachypneic at reset, cachectic, chronically ill-appearing, older-appearing than stated age Head:  Normocephalic and atraumatic. Eyes:  Sclera clear, no icterus.   Conjunctiva pink. Ears:  Normal auditory acuity. Nose:  No deformity, discharge,  or lesions. Mouth:  No deformity or lesions.  Oropharynx pink & moist. Neck:  Supple; no masses or thyromegaly. Lungs: Diminished aeration throughout, especially right lung fields.   Mild respiratory distress Heart:  Regular rate and rhythm; no murmurs, clicks, rubs,  or gallops. Abdomen:  Soft, nontender and nondistended. No masses, hepatosplenomegaly or hernias noted. Normal bowel sounds, without guarding, and without rebound.     Msk:  Frail and atrophic musculature, symmetrical without gross deformities. Normal posture. Pulses:  Normal pulses noted. Extremities:  Without clubbing or edema. Neurologic:  Alert and  oriented x4;  grossly normal neurologically. Skin:  Scattered bruising, otherwise intact without significant lesions or rashes. Psych:  Alert and cooperative. Depressed mood, flat affect   Lab Results:  Recent Labs  12/13/15 1032  WBC 5.8  HGB 12.9*  HCT 40.6  PLT 169   BMET  Recent Labs  12/13/15 1032  NA 142  K 4.1  CL 110  CO2 26  GLUCOSE 111*  BUN 31*  CREATININE 1.31*  CALCIUM 9.0   LFT  Recent Labs  12/13/15 1032  PROT 7.1  ALBUMIN 3.2*  AST 26  ALT 19  ALKPHOS 66  BILITOT 1.1   PT/INR No results for input(s): LABPROT, INR in the last 72 hours.  Studies/Results: Dg Chest 2 View  12/13/2015  CLINICAL DATA:  Worsening cough and shortness of breath.  Vomiting. EXAM: CHEST  2 VIEW COMPARISON:  12/05/2015 and 01/17/2015 FINDINGS: Chronic blunting at the right costophrenic angle. Prior median sternotomy. Stable appearance of the heart and mediastinum. Heart size is upper limits of normal. Interstitial prominence from the prior examination is less  conspicuous. There continues to be prominent densities at the right lung base which may be chronic. Mild degenerative changes in the thoracic spine. IMPRESSION: Patchy densities at the right lung base are nonspecific. This could represent acute  on chronic disease. Difficult to exclude a subtle infectious etiology at this location. Electronically Signed   By: Markus Daft M.D.   On: 12/13/2015 11:09   Impression:  1.  Nausea and vomiting.  This typically occurs after extensive coughing and hiccoughs.  I do not get sense that this is at root a gastroesophageal problem. 2.  Odynophagia; throat pain.  Patient has pain and soreness with swallowing in his throat and at back of neck.  After liquid traverses his throat, he denies odynophagia or dysphagia. 3.  Hiccoughs. 4.  Supraglottitis with ulcer.  Likely root source of symptoms #1 and #2 and #3 above. 5.  Shortness of breath with marked hypoxia (now resolved) upon arrival, chest xray concerning for possible pneumonia. 6.  Heart transplant on immunosuppression.  Plan:  1.  Agree with ENT consult (called by ED physician). 2.  PPI. 3.  Diet recommendations pending ENT findings (possible repeat laryngoscopy?). 4.  After ENT evaluation, if no obvious cause of symptoms identified, might consider water soluble (gastrograffin) esophagram (favored) versus endoscopy (less favored; at patient's current state, he would require intubation in order to perform procedure safely, and patient may very well have significant troubles becoming weaned from the ventilator). 5.  [facility] will revisit tomorrow to help guide ongoing management.   LOS: 0 days   Lotus Santillo M  12/13/2015, 3:27 PM  Pager [phone] If no answer or after 5 PM call [phone]

## 2015-12-13 NOTE — Progress Notes (Signed)
Pharmacy Antibiotic Note  Luke Jacobson. is a 75 y.o. male admitted on 12/13/2015 with pneumonia.  He was admitted 5/25-5/28 for acute laryngitis/supraglottitis and started on Unasyn per ID recommendation.  This was changed to Augmentin at discharge and he is on D9 antibiotics.  However, sputum cx grew out Pseudomonas which is not covered by his antibiotic regimen.  He is re-admitted today with worsening symptoms of dysphagia.  Pharmacy has been consulted for Zosyn dosing.  Plan: Zosyn 3.375g IV q8h (4 hour infusion).  Monitor renal function and patient progress     Temp (24hrs), Avg:98.8 F (37.1 C), Min:98.8 F (37.1 C), Max:98.8 F (37.1 C)   Recent Labs Lab 12/07/15 0525 12/13/15 1032  WBC 5.0 5.8  CREATININE 1.48* 1.31*    Estimated Creatinine Clearance: 41.1 mL/min (by C-G formula based on Cr of 1.31).    Allergies  Allergen Reactions  . Ambrisentan Other (See Comments), Itching and Swelling  . Heparin     Made white blood count go down. HIT    Antimicrobials this admission: 6/2 Zosyn >>  5/25 >> Levaquin >> 5/26 5/26 >> Vanc >> 5/26 5/26 >> Unasyn >>5/28 5/28 augmentin (pharyngitis)>>6/2  Dose adjustments this admission:  Microbiology results: 5/26 Sputum: PSA (sens Zosyn, Cefepime, Cipro, Imipenem)   Thank you for allowing pharmacy to be a part of this patient's care.  Netta Cedars, PharmD, BCPS Pager: [phone] 12/13/2015 3:57 PM

## 2015-12-13 NOTE — H&P (Signed)
History and Physical    Luke Jacobson. GYI:[id number] DOB: 1941-04-30 DOA: 12/13/2015  PCP: Gara Kroner, MD  Patient coming from: Home  Chief Complaint: Hiccups/regurgitation/  HPI: Luke Jacobson. is a 75 y.o. male with medical history significant of heart transplantation in 1997 status post stents to the heart in 2016, chronic respiratory failure with COPD on chronic home O2 at 6-7 L nasal cannula, AAA, hypertension, hyperlipidemia who was recently hospitalized 12/05/2015 - 12/08/2015 for acute ulcerative supraglottitis and discharged on oral antibiotics presented to the ED with 1 day history of pickups with a sensation of his throat closing up, regurgitation of clear saliva and a feeling like there is a narrowing as he tries to swallow food down his esophagus with associated nausea, multiple episodes of emesis one day prior to admission, chills, low-grade fever with a temp of 100, and diarrhea. Patient denies any chest pain, no change in chronic shortness of breath, no stridor, no abdominal pain, no constipation, no dysuria, no melena, no hematemesis, no hematochezia, no voice changes, no visual changes, no gait abnormalities. Patient was brought to the ED per EMS and per ED note when EMS arrived patient was noted to set at 65% on room air was subsequently placed on a nonrebreather and brought to the emergency room. Patient also with ongoing cough which is unchanged since discharge. Patient denies any lower extremity edema.   ED Course: Patient was seen in the ED compressive metabolic profile done had a BUN of 31 creatinine of 1.31 albumin of 3.2 otherwise was within normal limits. Point-of-care troponin was negative. BNP was 248.8. CBC had a hemoglobin of 12.9 otherwise was within normal limits. Chest x-ray showed patchy densities at the right lung base which are nonspecific but could represent acute or chronic disease difficult to exclude subtle infectious etiology at this location. EKG  showed normal sinus rhythm with no ischemic changes. Patient was given some IV Ativan with improvement with his checkups in the emergency room. Patient currently back on 6-7 L nasal cannula with sats in the low to mid 90s. ED physician stated she spoke with Dr. Wilburn Cornelia ENT was going to assess the patient with probable laryngoscope. Gastroenterology was also called who will come and assessed the patient. [facility] were called to admit the patient for further evaluation and management.  Review of Systems: As per HPI otherwise 10 point review of systems negative.   Past Medical History  Diagnosis Date  . Hypertension   . Shortness of breath   . Hypercholesteremia   . HIT (heparin-induced thrombocytopenia) ([place])   . COPD (chronic obstructive pulmonary disease) ([place])   . AAA (abdominal aortic aneurysm) ([place])   . Arrhythmia has had episodes of fast heart rate    Past Surgical History  Procedure Laterality Date  . Heart transplant    . Heart stents  Sept 2016    2 stents     reports that he quit smoking about 21 years ago. His smoking use included Cigarettes. He has a 30 pack-year smoking history. He has never used smokeless tobacco. He reports that he drinks alcohol. He reports that he does not use illicit drugs.  Allergies  Allergen Reactions  . Ambrisentan Other (See Comments), Itching and Swelling  . Heparin     Made white blood count go down. HIT    Family History  Problem Relation Age of Onset  . Heart attack Father   . Heart disease Mother   . Rheum arthritis Mother   .  Rheum arthritis Paternal Grandfather   . Cancer Paternal Grandfather    Mother deceased age 108 from old age and failure to thrive. Follow deceased age 71 from an acute MI.  Prior to Admission medications   Medication Sig Start Date End Date Taking? Authorizing Provider  acetaminophen (TYLENOL) 500 MG tablet Take 500-1,000 mg by mouth daily as needed for fever.    Yes Historical Provider, MD    ADCIRCA 20 MG TABS Take 40 mg by mouth daily.    Yes Historical Provider, MD  albuterol (PROAIR HFA) 108 (90 Base) MCG/ACT inhaler Inhale 2 puffs into the lungs every 6 (six) hours as needed. 09/18/15  Yes Rigoberto Noel, MD  amoxicillin-clavulanate (AUGMENTIN) 875-125 MG tablet Take 1 tablet by mouth every 12 (twelve) hours. 12/08/15  Yes Thurnell Lose, MD  aspirin EC 81 MG tablet Take 81 mg by mouth at bedtime.    Yes Historical Provider, MD  clopidogrel (PLAVIX) 75 MG tablet Take 75 mg by mouth daily. 10/17/15  Yes Historical Provider, MD  Coenzyme Q10 (CO Q10 PO) Take 500 mg by mouth daily.   Yes Historical Provider, MD  cycloSPORINE modified (NEORAL) 25 MG capsule Take 75 mg by mouth 2 (two) times daily.    Yes Historical Provider, MD  fexofenadine (ALLEGRA) 180 MG tablet Take 180 mg by mouth daily.   Yes Historical Provider, MD  fluticasone furoate-vilanterol (BREO ELLIPTA) 100-25 MCG/INH AEPB Inhale 1 puff into the lungs daily. 11/12/15  Yes Rigoberto Noel, MD  furosemide (LASIX) 20 MG tablet Take 40 mg by mouth daily as needed for fluid or edema.    Yes Historical Provider, MD  losartan (COZAAR) 50 MG tablet Take 50 mg by mouth daily with supper. 12/02/15  Yes Historical Provider, MD  magnesium oxide (MAG-OX) 400 MG tablet Take 400 mg by mouth 5 (five) times daily.    Yes Historical Provider, MD  Multiple Vitamin (MULTIVITAMIN) tablet Take 1 tablet by mouth daily.   Yes Historical Provider, MD  mycophenolate (CELLCEPT) 500 MG tablet Take 1,500 mg by mouth 2 (two) times daily.   Yes Historical Provider, MD  Omega-3 Fatty Acids (FISH OIL) 1000 MG CAPS Take 1 capsule by mouth every evening.   Yes Historical Provider, MD  omeprazole (PRILOSEC) 40 MG capsule Take 40 mg by mouth daily.   Yes Historical Provider, MD  simvastatin (ZOCOR) 40 MG tablet Take 40 mg by mouth daily.   Yes Historical Provider, MD  temazepam (RESTORIL) 15 MG capsule Take 30 mg by mouth at bedtime as needed. For sleep   Yes  Historical Provider, MD  tiotropium (SPIRIVA HANDIHALER) 18 MCG inhalation capsule INHALE CONTENTS OF 1 CAPSULE VIA HANDIHALER ONCE DAILY AS DIRECTED 07/16/15  Yes Rigoberto Noel, MD    Physical Exam: Filed Vitals:   12/13/15 1430 12/13/15 1442 12/13/15 1500 12/13/15 1512  BP: 143/76 143/76 144/68 144/68  Pulse: 73 77 71 72  Temp:      TempSrc:      Resp: _0 SpO2: 93% 90% 97% 90%      Constitutional: Frail chronically ill looking gentleman lying on gurney in no acute cardiopulmonary distress. Speaking in full sentences. Filed Vitals:   12/13/15 1430 12/13/15 1442 12/13/15 1500 12/13/15 1512  BP: 143/76 143/76 144/68 144/68  Pulse: 73 77 71 72  Temp:      TempSrc:      Resp: _1 SpO2: 93% 90% 97% 90%  Eyes: PERRLA, EOMI, lids and conjunctivae normal ENMT: Mucous membranes are moist. Posterior pharynx clear of any exudate or lesions.Normal dentition.  Neck: normal, supple, no masses, no thyromegaly Respiratory: Some right basilar coarse breath sounds otherwise clear. No crackles. No stridor. Normal respiratory effort. No accessory muscle use.  Cardiovascular: Regular rate and rhythm, no murmurs / rubs / gallops. No extremity edema. 2+ pedal pulses. No carotid bruits.  Abdomen: no tenderness, no masses palpated. No hepatosplenomegaly. Bowel sounds positive.  Musculoskeletal: no clubbing / cyanosis. No joint deformity upper and lower extremities. Good ROM, no contractures. Normal muscle tone.  Skin: no rashes, lesions, ulcers. No induration Neurologic: CN 2-12 grossly intact. Sensation intact, DTR normal. Strength 5/5 in all 4.  Psychiatric: Normal judgment and insight. Alert and oriented x 3. Normal mood.   Labs on Admission: I have personally reviewed following labs and imaging studies  CBC:  Recent Labs Lab 12/07/15 0525 12/13/15 1032  WBC 5.0 5.8  NEUTROABS  --  4.8  HGB 11.9* 12.9*  HCT 37.9* 40.6  MCV 93.1 93.1  PLT 85* 151   Basic Metabolic  Panel:  Recent Labs Lab 12/07/15 0525 12/13/15 1032  NA 139 142  K 4.2 4.1  CL 107 110  CO2 27 26  GLUCOSE 104* 111*  BUN 38* 31*  CREATININE 1.48* 1.31*  CALCIUM 8.6* 9.0   GFR: Estimated Creatinine Clearance: 41.1 mL/min (by C-G formula based on Cr of 1.31). Liver Function Tests:  Recent Labs Lab 12/13/15 1032  AST 26  ALT 19  ALKPHOS 66  BILITOT 1.1  PROT 7.1  ALBUMIN 3.2*   No results for input(s): LIPASE, AMYLASE in the last 168 hours. No results for input(s): AMMONIA in the last 168 hours. Coagulation Profile: No results for input(s): INR, PROTIME in the last 168 hours. Cardiac Enzymes: No results for input(s): CKTOTAL, CKMB, CKMBINDEX, TROPONINI in the last 168 hours. BNP (last 3 results) No results for input(s): PROBNP in the last 8760 hours. HbA1C: No results for input(s): HGBA1C in the last 72 hours. CBG: No results for input(s): GLUCAP in the last 168 hours. Lipid Profile: No results for input(s): CHOL, HDL, LDLCALC, TRIG, CHOLHDL, LDLDIRECT in the last 72 hours. Thyroid Function Tests: No results for input(s): TSH, T4TOTAL, FREET4, T3FREE, THYROIDAB in the last 72 hours. Anemia Panel: No results for input(s): VITAMINB12, FOLATE, FERRITIN, TIBC, IRON, RETICCTPCT in the last 72 hours. Urine analysis:    Component Value Date/Time   COLORURINE YELLOW 12/05/2015 1933   APPEARANCEUR CLEAR 12/05/2015 1933   LABSPEC 1.024 12/05/2015 1933   PHURINE 6.0 12/05/2015 1933   GLUCOSEU NEGATIVE 12/05/2015 1933   HGBUR NEGATIVE 12/05/2015 1933   BILIRUBINUR NEGATIVE 12/05/2015 [place] NEGATIVE 12/05/2015 1933   PROTEINUR 30* 12/05/2015 1933   UROBILINOGEN 1.0 03/10/2012 1603   NITRITE NEGATIVE 12/05/2015 1933   LEUKOCYTESUR NEGATIVE 12/05/2015 1933   Sepsis Labs: !!!!!!!!!!!!!!!!!!!!!!!!!!!!!!!!!!!!!!!!!!!! _0 (procalcitonin:4,lacticidven:4) ) Recent Results (from the past 240 hour(s))  Blood culture (routine x 2)     Status: None    Collection Time: 12/05/15  9:49 PM  Result Value Ref Range Status   Specimen Description RIGHT ANTECUBITAL  Final   Special Requests BOTTLES DRAWN AEROBIC AND ANAEROBIC 5CC  Final   Culture   Final    NO GROWTH 5 DAYS Performed at [facility]    Report Status 12/10/2015 FINAL  Final  Blood culture (routine x 2)     Status: None   Collection Time: 12/05/15  9:49 PM  Result Value Ref Range Status   Specimen Description BLOOD RIGHT HAND  Final   Special Requests BOTTLES DRAWN AEROBIC ONLY 5CC  Final   Culture   Final    NO GROWTH 5 DAYS Performed at [facility]    Report Status 12/10/2015 FINAL  Final  Respiratory Panel by PCR     Status: None   Collection Time: 12/05/15 11:55 PM  Result Value Ref Range Status   Adenovirus NOT DETECTED NOT DETECTED Final   Coronavirus 229E NOT DETECTED NOT DETECTED Final   Coronavirus HKU1 NOT DETECTED NOT DETECTED Final   Coronavirus NL63 NOT DETECTED NOT DETECTED Final   Coronavirus OC43 NOT DETECTED NOT DETECTED Final   Metapneumovirus NOT DETECTED NOT DETECTED Final   Rhinovirus / Enterovirus NOT DETECTED NOT DETECTED Final   Influenza A NOT DETECTED NOT DETECTED Final   Influenza A H1 NOT DETECTED NOT DETECTED Final   Influenza A H1 2009 NOT DETECTED NOT DETECTED Final   Influenza A H3 NOT DETECTED NOT DETECTED Final   Influenza B NOT DETECTED NOT DETECTED Final   Parainfluenza Virus 1 NOT DETECTED NOT DETECTED Final   Parainfluenza Virus 2 NOT DETECTED NOT DETECTED Final   Parainfluenza Virus 3 NOT DETECTED NOT DETECTED Final   Parainfluenza Virus 4 NOT DETECTED NOT DETECTED Final   Respiratory Syncytial Virus NOT DETECTED NOT DETECTED Final   Bordetella pertussis NOT DETECTED NOT DETECTED Final   Chlamydophila pneumoniae NOT DETECTED NOT DETECTED Final   Mycoplasma pneumoniae NOT DETECTED NOT DETECTED Final  Culture, sputum-assessment     Status: None   Collection Time: 12/06/15  1:34 AM  Result Value Ref Range Status     Specimen Description SPUTUM  Final   Special Requests Immunocompromised  Final   Sputum evaluation   Final    THIS SPECIMEN IS ACCEPTABLE. RESPIRATORY CULTURE REPORT TO FOLLOW.   Report Status 12/06/2015 FINAL  Final  Culture, respiratory (NON-Expectorated)     Status: None   Collection Time: 12/06/15  1:34 AM  Result Value Ref Range Status   Specimen Description SPUTUM  Final   Special Requests NONE  Final   Gram Stain   Final    ABUNDANT WBC PRESENT, PREDOMINANTLY PMN MODERATE GRAM NEGATIVE RODS FEW GRAM POSITIVE COCCI IN PAIRS    Culture MODERATE PSEUDOMONAS AERUGINOSA  Final   Report Status 12/09/2015 FINAL  Final   Organism ID, Bacteria PSEUDOMONAS AERUGINOSA  Final      Susceptibility   Pseudomonas aeruginosa - MIC*    CEFTAZIDIME 4 SENSITIVE Sensitive     CIPROFLOXACIN <=0.25 SENSITIVE Sensitive     GENTAMICIN <=1 SENSITIVE Sensitive     IMIPENEM 1 SENSITIVE Sensitive     PIP/TAZO 8 SENSITIVE Sensitive     CEFEPIME 4 SENSITIVE Sensitive     * MODERATE PSEUDOMONAS AERUGINOSA  Rapid strep screen (not at [facility])     Status: None   Collection Time: 12/06/15 11:53 AM  Result Value Ref Range Status   Streptococcus, Group A Screen (Direct) NEGATIVE NEGATIVE Final    Comment: (NOTE) A Rapid Antigen test may result negative if the antigen level in the sample is below the detection level of this test. The FDA has not cleared this test as a stand-alone test therefore the rapid antigen negative result has reflexed to a Group A Strep culture.   Culture, group A strep     Status: None   Collection Time: 12/06/15 11:53 AM  Result Value Ref  Range Status   Specimen Description THROAT  Final   Special Requests NONE Reflexed from [id number]  Final   Culture   Final    NO GROUP A STREP (S.PYOGENES) ISOLATED Performed at [facility]    Report Status 12/09/2015 FINAL  Final     Radiological Exams on Admission: Dg Chest 2 View  12/13/2015  CLINICAL DATA:  Worsening cough and  shortness of breath.  Vomiting. EXAM: CHEST  2 VIEW COMPARISON:  12/05/2015 and 01/17/2015 FINDINGS: Chronic blunting at the right costophrenic angle. Prior median sternotomy. Stable appearance of the heart and mediastinum. Heart size is upper limits of normal. Interstitial prominence from the prior examination is less conspicuous. There continues to be prominent densities at the right lung base which may be chronic. Mild degenerative changes in the thoracic spine. IMPRESSION: Patchy densities at the right lung base are nonspecific. This could represent acute on chronic disease. Difficult to exclude a subtle infectious etiology at this location. Electronically Signed   By: Markus Daft M.D.   On: 12/13/2015 11:09    EKG: Independently reviewed. NSR  Assessment/Plan Principal Problem:   Regurgitation Active Problems:   HCAP (healthcare-associated pneumonia)   Aspiration pneumonia (HCC)   CAD (coronary artery disease)   H/O heart transplant ([place])   Hypertension   Pulmonary hypertension (HCC)   ILD (interstitial lung disease) (HCC)   Hiccups   CKD (chronic kidney disease), stage III   #1 regurgitation Questionable etiology. Patient recently discharged after being treated for acute ulcerative supraglottitis and also with a complicated history status post cardiac transplantation on immunosuppressive medications. Patient seems to be protecting his airway and speaking in full sentences. Patient feels when he swallows them might be a narrowing as well as. Concern for possible glottitis versus esophagitis. Will admit the patient to stepdown unit for closer monitoring. ENT has been consulted by EDP and patient will be assessed for possible laryngoscope. GI will also assess patient per EDP for further evaluation patient may need upper endoscopy if laryngoscope is negative to rule out esophagitis versus other etiologies. Will place patient empirically on IV Zosyn for now, clear liquids, supportive care.  #2  healthcare associated pneumonia versus aspiration pneumonia Per chest x-ray. Patient with a cough with clear sputum. Patient states no significant change in his chronic shortness of breath. Patient noted to have some coarse breath sounds in the right base. Patient on immunosuppressive medications secondary to cardiac transplant. Will check a sputum Gram stain and culture, blood cultures 2, urine Legionella antigen, urine pneumococcus antigen. Sputum Gram stain and culture from 12/06/2015 consistent with moderate pseudomonas aeruginosa. Will place patient on oxygen, nebulizer treatments, Mucinex, empiric IV Zosyn. Follow.  #3 chronic kidney disease stage III Stable.  #4 chronic respiratory failure on 6-7 L nasal cannula home O2/interstitial lung disease Stable. Patient states no significant change with shortness of breath. Continue home regimen of nebulizers. See problem #2.  #5 hiccups May be secondary to probable pneumonia with irritation of the phrenic nerve versus esophageal etiology. GI has been consulted and will be assessing the patient.  #6 history of heart transplant/coronary artery disease Stable. Continue home regimen of immunosuppressive medications of cyclosporine, CellCept, Cozaar, aspirin, Plavix. Follow.    DVT prophylaxis:SCDs Code Status: Full Family Communication: Updated patient. No family at bedside. Disposition Plan: Home once medically stable. Consults called: Gastroenterology/ per EDP./ENT Dr Wilburn Cornelia Admission status: Admit to stepdown unit/inpatient   [facility] MD [facility] Pager [phone]  If 7PM-7AM, please contact night-coverage  [email or web address] Password [facility]  12/13/2015, 3:28 PM

## 2015-12-13 NOTE — Consult Note (Signed)
ENT CONSULT:  Reason for Consult: dysphagia Referring Physician: EDP  Luke Jacobson. is an 75 y.o. male.  HPI: The patient presents to the [facility] emergency department with a several day history of progressive symptoms of dysphagia, hiccups and difficulty swallowing. He was previously seen 1 week ago during hospitalization, flexible laryngoscopy by Dr. Janace Hoard at that time showed normal airway with ulceration/irritation over the arytenoids. The patient was discharged several days later with gradual improvement in symptoms, swallowing near normal food, no shortness of breath or significant respiratory symptoms. He reports a 2 day history of increasing dysphagia, difficulty swallowing secretions and hiccups. He complains of tightness and burning sensation in the mid chest. He reports regurgitation of swallowed foods. No sore throat or respiratory symptoms. Patient is chronically immunosuppressed after previous cardiac transplant.  Past Medical History  Diagnosis Date  . Hypertension   . Shortness of breath   . Hypercholesteremia   . HIT (heparin-induced thrombocytopenia) ([place])   . COPD (chronic obstructive pulmonary disease) ([place])   . AAA (abdominal aortic aneurysm) ([place])   . Arrhythmia has had episodes of fast heart rate    Past Surgical History  Procedure Laterality Date  . Heart transplant    . Heart stents  Sept 2016    2 stents    Family History  Problem Relation Age of Onset  . Heart attack Father   . Heart disease Mother   . Rheum arthritis Mother   . Rheum arthritis Paternal Grandfather   . Cancer Paternal Grandfather     Social History:  reports that he quit smoking about 21 years ago. His smoking use included Cigarettes. He has a 30 pack-year smoking history. He has never used smokeless tobacco. He reports that he drinks alcohol. He reports that he does not use illicit drugs.  Allergies:  Allergies  Allergen Reactions  . Ambrisentan Other (See  Comments), Itching and Swelling  . Heparin     Made white blood count go down. HIT    Medications: I have reviewed the patient's current medications.  Results for orders placed or performed during the hospital encounter of 12/13/15 (from the past 48 hour(s))  CBC with Differential/Platelet     Status: Abnormal   Collection Time: 12/13/15 10:32 AM  Result Value Ref Range   WBC 5.8 4.0 - 10.5 K/uL   RBC 4.36 4.22 - 5.81 MIL/uL   Hemoglobin 12.9 (L) 13.0 - 17.0 g/dL   HCT 40.6 39.0 - 52.0 %   MCV 93.1 78.0 - 100.0 fL   MCH 29.6 26.0 - 34.0 pg   MCHC 31.8 30.0 - 36.0 g/dL   RDW 13.8 11.5 - 15.5 %   Platelets 169 150 - 400 K/uL   Neutrophils Relative % 83 %   Neutro Abs 4.8 1.7 - 7.7 K/uL   Lymphocytes Relative 9 %   Lymphs Abs 0.5 (L) 0.7 - 4.0 K/uL   Monocytes Relative 7 %   Monocytes Absolute 0.4 0.1 - 1.0 K/uL   Eosinophils Relative 1 %   Eosinophils Absolute 0.1 0.0 - 0.7 K/uL   Basophils Relative 0 %   Basophils Absolute 0.0 0.0 - 0.1 K/uL  Comprehensive metabolic panel     Status: Abnormal   Collection Time: 12/13/15 10:32 AM  Result Value Ref Range   Sodium 142 135 - 145 mmol/L   Potassium 4.1 3.5 - 5.1 mmol/L   Chloride 110 101 - 111 mmol/L   CO2 26 22 - 32 mmol/L  Glucose, Bld 111 (H) 65 - 99 mg/dL   BUN 31 (H) 6 - 20 mg/dL   Creatinine, Ser 1.31 (H) 0.61 - 1.24 mg/dL   Calcium 9.0 8.9 - 10.3 mg/dL   Total Protein 7.1 6.5 - 8.1 g/dL   Albumin 3.2 (L) 3.5 - 5.0 g/dL   AST 26 15 - 41 U/L   ALT 19 17 - 63 U/L   Alkaline Phosphatase 66 38 - 126 U/L   Total Bilirubin 1.1 0.3 - 1.2 mg/dL   GFR calc non Af Amer 52 (L) >60 mL/min   GFR calc Af Amer >60 >60 mL/min    Comment: (NOTE) The eGFR has been calculated using the CKD EPI equation. This calculation has not been validated in all clinical situations. eGFR's persistently <60 mL/min signify possible Chronic Kidney Disease.    Anion gap 6 5 - 15  Brain natriuretic peptide     Status: Abnormal   Collection Time:  12/13/15 10:32 AM  Result Value Ref Range   B Natriuretic Peptide 248.8 (H) 0.0 - 100.0 pg/mL  I-stat troponin, ED     Status: None   Collection Time: 12/13/15 10:40 AM  Result Value Ref Range   Troponin i, poc 0.02 0.00 - 0.08 ng/mL   Comment 3            Comment: Due to the release kinetics of cTnI, a negative result within the first hours of the onset of symptoms does not rule out myocardial infarction with certainty. If myocardial infarction is still suspected, repeat the test at appropriate intervals.     Dg Chest 2 View  12/13/2015  CLINICAL DATA:  Worsening cough and shortness of breath.  Vomiting. EXAM: CHEST  2 VIEW COMPARISON:  12/05/2015 and 01/17/2015 FINDINGS: Chronic blunting at the right costophrenic angle. Prior median sternotomy. Stable appearance of the heart and mediastinum. Heart size is upper limits of normal. Interstitial prominence from the prior examination is less conspicuous. There continues to be prominent densities at the right lung base which may be chronic. Mild degenerative changes in the thoracic spine. IMPRESSION: Patchy densities at the right lung base are nonspecific. This could represent acute on chronic disease. Difficult to exclude a subtle infectious etiology at this location. Electronically Signed   By: Markus Daft M.D.   On: 12/13/2015 11:09    ROS:ROS  Blood pressure 144/68, pulse 72, temperature 98.8 F (37.1 C), temperature source Rectal, resp. rate 29, SpO2 90 %.  PHYSICAL EXAM: General appearance - alert, well appearing, and in no distress Nose - normal and patent, no erythema, discharge or polyps Mouth - mucous membranes moist, pharynx normal without lesions Neck - supple, no significant adenopathy   Procedure Note: Flexible Laryngoscopy  Risks/benefits and possible complications were discussed in detail. Patient understands and agrees to proceed with procedure.   Procedure: 4 mm flexible laryngoscope inserted through the nasal  passageway with minimal discomfort. Nasal cavity and nasopharynx patent without discharge, mass or polyp. Normal base of tongue and supraglottis, normal vocal cord mobility. No evidence of vocal cord paralysis, mass or lesion. Airway intact, no edema, swelling or obstruction. Previously noted irritation/erythema with minimal exudate over the right arytenoid. No evidence of ulcer or mass. Normal hypopharynx without evidence of mass or aspiration.  Patient tolerated procedure without complication or difficulty.  Early Chars. Wilburn Cornelia, M.D.    Studies Reviewed: None  Assessment/Plan: The patient presents for evaluation of new onset dysphagia, regurgitation and hiccups. Symptoms of sore throat and throat  irritation have resolved since previous hospitalization, patient's airway stable. Resolving irritation along the right arytenoid. Etiology of patient's new symptoms unclear, concerns regarding possible esophageal abnormality versus infection. Patient scheduled for evaluation by GI service and possible upper endoscopy. The patient has Pseudomonas positive sputum which certainly will require additional anabolic therapy and further treatment. Monitor patient's voice and throat symptoms, plan follow-up as an outpatient for repeat examination in the next several weeks. Please reconsult for reevaluation if further laryngeal concerns.   [place], Arya Boxley 12/13/2015, 4:10 PM

## 2015-12-13 NOTE — ED Notes (Signed)
Bed: RESA Expected date:  Expected time:  Means of arrival:  Comments: EMS 73 - SOB non-rebreather

## 2015-12-13 NOTE — Progress Notes (Addendum)
UR completed intequal & [facility] Re admission

## 2015-12-14 ENCOUNTER — Inpatient Hospital Stay (HOSPITAL_COMMUNITY): Payer: Medicare Other

## 2015-12-14 DIAGNOSIS — R1314 Dysphagia, pharyngoesophageal phase: Secondary | ICD-10-CM

## 2015-12-14 DIAGNOSIS — R1310 Dysphagia, unspecified: Secondary | ICD-10-CM

## 2015-12-14 LAB — COMPREHENSIVE METABOLIC PANEL
ALBUMIN: 2.5 g/dL — AB (ref 3.5–5.0)
ALK PHOS: 56 U/L (ref 38–126)
ALT: 14 U/L — ABNORMAL LOW (ref 17–63)
AST: 21 U/L (ref 15–41)
Anion gap: 6 (ref 5–15)
BILIRUBIN TOTAL: 1.2 mg/dL (ref 0.3–1.2)
BUN: 28 mg/dL — AB (ref 6–20)
CALCIUM: 8.4 mg/dL — AB (ref 8.9–10.3)
CO2: 24 mmol/L (ref 22–32)
CREATININE: 1.67 mg/dL — AB (ref 0.61–1.24)
Chloride: 112 mmol/L — ABNORMAL HIGH (ref 101–111)
GFR calc Af Amer: 45 mL/min — ABNORMAL LOW (ref >60)
GFR, EST NON AFRICAN AMERICAN: 39 mL/min — AB (ref >60)
GLUCOSE: 88 mg/dL (ref 65–99)
Potassium: 4.2 mmol/L (ref 3.5–5.1)
SODIUM: 142 mmol/L (ref 135–145)
TOTAL PROTEIN: 5.6 g/dL — AB (ref 6.5–8.1)

## 2015-12-14 LAB — LEGIONELLA PNEUMOPHILA SEROGP 1 UR AG: L. PNEUMOPHILA SEROGP 1 UR AG: NEGATIVE

## 2015-12-14 LAB — STREP PNEUMONIAE URINARY ANTIGEN: Strep Pneumo Urinary Antigen: NEGATIVE

## 2015-12-14 LAB — MAGNESIUM: Magnesium: 2 mg/dL (ref 1.7–2.4)

## 2015-12-14 LAB — GLUCOSE, CAPILLARY: GLUCOSE-CAPILLARY: 83 mg/dL (ref 65–99)

## 2015-12-14 LAB — CBC
HEMATOCRIT: 35.8 % — AB (ref 39.0–52.0)
Hemoglobin: 10.9 g/dL — ABNORMAL LOW (ref 13.0–17.0)
MCH: 28.9 pg (ref 26.0–34.0)
MCHC: 30.4 g/dL (ref 30.0–36.0)
MCV: 95 fL (ref 78.0–100.0)
PLATELETS: 135 10*3/uL — AB (ref 150–400)
RBC: 3.77 MIL/uL — ABNORMAL LOW (ref 4.22–5.81)
RDW: 14.1 % (ref 11.5–15.5)
WBC: 4.9 10*3/uL (ref 4.0–10.5)

## 2015-12-14 LAB — HIV ANTIBODY (ROUTINE TESTING W REFLEX): HIV Screen 4th Generation wRfx: NONREACTIVE

## 2015-12-14 MED ORDER — PANTOPRAZOLE SODIUM 40 MG PO TBEC
40.0000 mg | DELAYED_RELEASE_TABLET | Freq: Two times a day (BID) | ORAL | Status: DC
Start: 2015-12-14 — End: 2015-12-18
  Administered 2015-12-14 – 2015-12-18 (×8): 40 mg via ORAL
  Filled 2015-12-14 (×8): qty 1

## 2015-12-14 MED ORDER — IPRATROPIUM-ALBUTEROL 0.5-2.5 (3) MG/3ML IN SOLN
3.0000 mL | Freq: Three times a day (TID) | RESPIRATORY_TRACT | Status: DC
  Administered 2015-12-14 – 2015-12-16 (×5): 3 mL via RESPIRATORY_TRACT
  Filled 2015-12-14 (×5): qty 3

## 2015-12-14 MED ORDER — DIATRIZOATE MEGLUMINE & SODIUM 66-10 % PO SOLN
60.0000 mL | Freq: Once | ORAL | Status: AC
  Administered 2015-12-14: 60 mL via ORAL
  Filled 2015-12-14: qty 60

## 2015-12-14 MED ORDER — FLUCONAZOLE IN SODIUM CHLORIDE 200-0.9 MG/100ML-% IV SOLN
200.0000 mg | INTRAVENOUS | Status: DC
Start: 2015-12-14 — End: 2015-12-18
  Administered 2015-12-14 – 2015-12-18 (×5): 200 mg via INTRAVENOUS
  Filled 2015-12-14 (×5): qty 100

## 2015-12-14 NOTE — Progress Notes (Addendum)
Patient ID: Luke Oh., male   DOB: 1941-05-28, 75 y.o.   MRN: [id number] [facility] Gastroenterology Progress Note  Vonnie Ligman. 75 y.o. [birth year]   Subjective: Feels ok. Tolerating clear liquids without difficulty and denies odynophagia.  Objective: Vital signs in last 24 hours: Filed Vitals:   12/14/15 1000 12/14/15 1200  BP: 138/70 164/78  Pulse: 78 72  Temp:    Resp: 35 30   T 98.2  Physical Exam: Gen: lethargic, elderly, frail, no acute distress, thin HEENT: anicteric sclera CV: RRR Chest: CTA B Abd: soft, nontender, nondistended, +BS  Lab Results:  Recent Labs  12/13/15 1032 12/13/15 1646 12/14/15 0338  NA 142  --  142  K 4.1  --  4.2  CL 110  --  112*  CO2 26  --  24  GLUCOSE 111*  --  88  BUN 31*  --  28*  CREATININE 1.31*  --  1.67*  CALCIUM 9.0  --  8.4*  MG  --  2.1 2.0    Recent Labs  12/13/15 1032 12/14/15 0338  AST 26 21  ALT 19 14*  ALKPHOS 66 56  BILITOT 1.1 1.2  PROT 7.1 5.6*  ALBUMIN 3.2* 2.5*    Recent Labs  12/13/15 1032 12/14/15 0338  WBC 5.8 4.9  NEUTROABS 4.8  --   HGB 12.9* 10.9*  HCT 40.6 35.8*  MCV 93.1 95.0  PLT 169 135*   No results for input(s): LABPROT, INR in the last 72 hours.    Assessment/Plan: 75 yo with odynophagia, nausea, and vomiting and s/p gastrograffin swallow study today concerning for a possible ulcer in the mid-esophagus. He could have Candida esophagitis as well and would treat empirically with IV Diflucan (discussed with Dr. Grandville Silos who has subsequently started the Diflucan). Would give IV Diflucan until discharge and PO to complete a 10 day course. Would manage conservatively and would NOT recommend an upper endoscopy at this time unless he fails to tolerate advancement of his diet despite medical management. Increase PPI to PO BID. Continue on clear liquids today and if continues to tolerate then ok to change to full liquids tomorrow. If tolerating full liquids then advance slowly as  tolerated. Agree with Dr. Paulita Fujita that if an EGD is deemed necessary he would likely need general anethesia. D/W Dr. Grandville Silos. Will sign off. Call if questions.   [place] C. 12/14/2015, 12:52 PM  Pager [phone]  If no answer or after 5 PM call [phone]

## 2015-12-14 NOTE — Progress Notes (Addendum)
PROGRESS NOTE    Luke Jacobson.  BTD:[id number] DOB: [birth year] DOA: 12/13/2015 PCP: Gara Kroner, MD   Brief Narrative:  Luke Funke. is a 75 y.o. male with medical history significant of heart transplantation in 1997 status post stents to the heart in 2016, chronic respiratory failure with COPD on chronic home O2 at 6-7 L nasal cannula, AAA, hypertension, hyperlipidemia who was recently hospitalized 12/05/2015 - 12/08/2015 for acute ulcerative supraglottitis and discharged on oral antibiotics presented to the ED with 1 day history of pickups with a sensation of his throat closing up, regurgitation of clear saliva and a feeling like there is a narrowing as he tries to swallow food down his esophagus with associated nausea, multiple episodes of emesis one day prior to admission, chills, low-grade fever with a temp of 100, and diarrhea. Luke Jacobson denies any chest pain, no change in chronic shortness of breath, no stridor, no abdominal pain, no constipation, no dysuria, no melena, no hematemesis, no hematochezia, no voice changes, no visual changes, no gait abnormalities. Luke Jacobson was brought to the ED per EMS and per ED note when EMS arrived Luke Jacobson was noted to set at 65% on room air was subsequently placed on a nonrebreather and brought to the emergency room. Luke Jacobson also with ongoing cough which is unchanged since discharge. Luke Jacobson denies any lower extremity edema.   Assessment & Plan:   Principal Problem:   Regurgitation Active Problems:   HCAP (healthcare-associated pneumonia)   Aspiration pneumonia (HCC)   CAD (coronary artery disease)   H/O heart transplant ([place])   Hypertension   Pulmonary hypertension (HCC)   ILD (interstitial lung disease) ([place])   Hiccups   CKD (chronic kidney disease), stage III   Odynophagia   Dysphagia   #1 regurgitation/dysphagia/odynophagia/Hiccups Questionable etiology. Concern for an GI etiology. Luke Jacobson recently hospitalized and treated for  acute ulcerative supraglottitis and as such there was concern and ENT consulted. Luke Jacobson underwent laryngoscope on 12/13/2015 which was unremarkable. Laryngoscope showed clinical improvement from admission. Luke Jacobson with no stridor. No change in voice. Luke Jacobson was also seen in consultation by gastroenterology well following. Will get a water-soluble esophagram today for further evaluation. Continue empiric IV antibiotics for pneumonia. Please empirically on IV Diflucan. GI following. Continue supportive care. Follow.  #2 healthcare associated pneumonia likely secondary to Pseudomonas  versus aspiration pneumonia The chest x-ray. Luke Jacobson is afebrile. Luke Jacobson states no significant change in his shortness of breath. Luke Jacobson with some coarse breath sounds in the right base. Luke Jacobson on immunosuppressive medications secondary to cardiac transplant. Sputum Gram stain and culture from 12/06/2015 was consistent with moderate pseudomonas aeruginosa. Blood cultures are pending. Urine pneumococcus antigen is negative. Urine Legionella antigen is pending. Continue oxygen, scheduled nebulizers, Mucinex, Claritin, IV Zosyn. Follow.  #3 coronary artery disease/history of cardiac transplant Stable. Continue home regimen of immunosuppressive medications of cyclosporine, CellCept, Cozaar, aspirin, Plavix.  #4 chronic kidney disease stage III Stable.  #5 chronic respiratory failure on 6-7 L nasal cannula at home/interstitial lung disease Stable. Luke Jacobson states no significant change in shortness of breath. Continue home regimen of nebulizers. See #2.  #6 hiccups May be secondary to pneumonia with irritation of the phrenic nerve versus esophageal etiology. Luke Jacobson will esophagram this morning. Clinical improvement. Continue empiric IV antibiotics. Thorazine as needed.     DVT prophylaxis: SCDs Code Status: Full Family Communication: Updated Luke Jacobson no family at bedside. Disposition Plan: Home when medically stable  and dysphagia/odynophagia resolved or improved and per GI.   Consultants:  ENT: Dr. Wilburn Cornelia 12/13/2015  Gastroenterology: Dr. Paulita Fujita 12/13/2015  Procedures:   Chest x-ray 12/13/2015  Antimicrobials:   IV Zosyn 12/13/2015   Subjective: Luke Jacobson sleeping easily arousable. Improvement with hiccups. Luke Jacobson unable to tell at this time as to whether improvement with regurgitation as he states he just woke up and hasn't tried to eat or drink anything. No chest pain. No change in chronic shortness of breath.  Objective: Filed Vitals:   12/14/15 0500 12/14/15 0600 12/14/15 0800 12/14/15 0830  BP:  127/69 119/60 119/60  Pulse: 69 63 67 67  Temp:   98.2 F (36.8 C)   TempSrc:   Oral   Resp: 30 36 34 24  Height:      Weight:      SpO2: 92% 96% 95% 95%    Intake/Output Summary (Last 24 hours) at 12/14/15 0922 Last data filed at 12/14/15 0800  Gross per 24 hour  Intake 1577.5 ml  Output    500 ml  Net 1077.5 ml   Filed Weights   12/13/15 1625  Weight: 56.2 kg (123 lb 14.4 oz)    Examination:  General exam: Appears calm and comfortable  Respiratory system: Some coarse BS in right base. Respiratory effort normal. Cardiovascular system: S1 & S2 heard, RRR. No JVD, murmurs, rubs, gallops or clicks. No pedal edema. Gastrointestinal system: Abdomen is nondistended, soft and nontender. No organomegaly or masses felt. Normal bowel sounds heard. Central nervous system: Alert and oriented. No focal neurological deficits. Extremities: Symmetric 5 x 5 power. Skin: No rashes, lesions or ulcers Psychiatry: Judgement and insight appear normal. Mood & affect appropriate.     Data Reviewed: I have personally reviewed following labs and imaging studies  CBC:  Recent Labs Lab 12/13/15 1032 12/14/15 0338  WBC 5.8 4.9  NEUTROABS 4.8  --   HGB 12.9* 10.9*  HCT 40.6 35.8*  MCV 93.1 95.0  PLT 169 297*   Basic Metabolic Panel:  Recent Labs Lab 12/13/15 1032 12/13/15 1646  12/14/15 0338  NA 142  --  142  K 4.1  --  4.2  CL 110  --  112*  CO2 26  --  24  GLUCOSE 111*  --  88  BUN 31*  --  28*  CREATININE 1.31*  --  1.67*  CALCIUM 9.0  --  8.4*  MG  --  2.1 2.0   GFR: Estimated Creatinine Clearance: 31.3 mL/min (by C-G formula based on Cr of 1.67). Liver Function Tests:  Recent Labs Lab 12/13/15 1032 12/14/15 0338  AST 26 21  ALT 19 14*  ALKPHOS 66 56  BILITOT 1.1 1.2  PROT 7.1 5.6*  ALBUMIN 3.2* 2.5*   No results for input(s): LIPASE, AMYLASE in the last 168 hours. No results for input(s): AMMONIA in the last 168 hours. Coagulation Profile: No results for input(s): INR, PROTIME in the last 168 hours. Cardiac Enzymes: No results for input(s): CKTOTAL, CKMB, CKMBINDEX, TROPONINI in the last 168 hours. BNP (last 3 results) No results for input(s): PROBNP in the last 8760 hours. HbA1C: No results for input(s): HGBA1C in the last 72 hours. CBG:  Recent Labs Lab 12/14/15 0754  GLUCAP 83   Lipid Profile: No results for input(s): CHOL, HDL, LDLCALC, TRIG, CHOLHDL, LDLDIRECT in the last 72 hours. Thyroid Function Tests: No results for input(s): TSH, T4TOTAL, FREET4, T3FREE, THYROIDAB in the last 72 hours. Anemia Panel: No results for input(s): VITAMINB12, FOLATE, FERRITIN, TIBC, IRON, RETICCTPCT in the last 72 hours. Sepsis Labs:  No results for input(s): PROCALCITON, LATICACIDVEN in the last 168 hours.  Recent Results (from the past 240 hour(s))  Blood culture (routine x 2)     Status: None   Collection Time: 12/05/15  9:49 PM  Result Value Ref Range Status   Specimen Description RIGHT ANTECUBITAL  Final   Special Requests BOTTLES DRAWN AEROBIC AND ANAEROBIC 5CC  Final   Culture   Final    NO GROWTH 5 DAYS Performed at [facility]    Report Status 12/10/2015 FINAL  Final  Blood culture (routine x 2)     Status: None   Collection Time: 12/05/15  9:49 PM  Result Value Ref Range Status   Specimen Description BLOOD RIGHT  HAND  Final   Special Requests BOTTLES DRAWN AEROBIC ONLY 5CC  Final   Culture   Final    NO GROWTH 5 DAYS Performed at [facility]    Report Status 12/10/2015 FINAL  Final  Respiratory Panel by PCR     Status: None   Collection Time: 12/05/15 11:55 PM  Result Value Ref Range Status   Adenovirus NOT DETECTED NOT DETECTED Final   Coronavirus 229E NOT DETECTED NOT DETECTED Final   Coronavirus HKU1 NOT DETECTED NOT DETECTED Final   Coronavirus NL63 NOT DETECTED NOT DETECTED Final   Coronavirus OC43 NOT DETECTED NOT DETECTED Final   Metapneumovirus NOT DETECTED NOT DETECTED Final   Rhinovirus / Enterovirus NOT DETECTED NOT DETECTED Final   Influenza A NOT DETECTED NOT DETECTED Final   Influenza A H1 NOT DETECTED NOT DETECTED Final   Influenza A H1 2009 NOT DETECTED NOT DETECTED Final   Influenza A H3 NOT DETECTED NOT DETECTED Final   Influenza B NOT DETECTED NOT DETECTED Final   Parainfluenza Virus 1 NOT DETECTED NOT DETECTED Final   Parainfluenza Virus 2 NOT DETECTED NOT DETECTED Final   Parainfluenza Virus 3 NOT DETECTED NOT DETECTED Final   Parainfluenza Virus 4 NOT DETECTED NOT DETECTED Final   Respiratory Syncytial Virus NOT DETECTED NOT DETECTED Final   Bordetella pertussis NOT DETECTED NOT DETECTED Final   Chlamydophila pneumoniae NOT DETECTED NOT DETECTED Final   Mycoplasma pneumoniae NOT DETECTED NOT DETECTED Final  Culture, sputum-assessment     Status: None   Collection Time: 12/06/15  1:34 AM  Result Value Ref Range Status   Specimen Description SPUTUM  Final   Special Requests Immunocompromised  Final   Sputum evaluation   Final    THIS SPECIMEN IS ACCEPTABLE. RESPIRATORY CULTURE REPORT TO FOLLOW.   Report Status 12/06/2015 FINAL  Final  Culture, respiratory (NON-Expectorated)     Status: None   Collection Time: 12/06/15  1:34 AM  Result Value Ref Range Status   Specimen Description SPUTUM  Final   Special Requests NONE  Final   Gram Stain   Final     ABUNDANT WBC PRESENT, PREDOMINANTLY PMN MODERATE GRAM NEGATIVE RODS FEW GRAM POSITIVE COCCI IN PAIRS    Culture MODERATE PSEUDOMONAS AERUGINOSA  Final   Report Status 12/09/2015 FINAL  Final   Organism ID, Bacteria PSEUDOMONAS AERUGINOSA  Final      Susceptibility   Pseudomonas aeruginosa - MIC*    CEFTAZIDIME 4 SENSITIVE Sensitive     CIPROFLOXACIN <=0.25 SENSITIVE Sensitive     GENTAMICIN <=1 SENSITIVE Sensitive     IMIPENEM 1 SENSITIVE Sensitive     PIP/TAZO 8 SENSITIVE Sensitive     CEFEPIME 4 SENSITIVE Sensitive     * MODERATE PSEUDOMONAS AERUGINOSA  Rapid strep screen (not at [facility])     Status: None   Collection Time: 12/06/15 11:53 AM  Result Value Ref Range Status   Streptococcus, Group A Screen (Direct) NEGATIVE NEGATIVE Final    Comment: (NOTE) A Rapid Antigen test may result negative if the antigen level in the sample is below the detection level of this test. The FDA has not cleared this test as a stand-alone test therefore the rapid antigen negative result has reflexed to a Group A Strep culture.   Culture, group A strep     Status: None   Collection Time: 12/06/15 11:53 AM  Result Value Ref Range Status   Specimen Description THROAT  Final   Special Requests NONE Reflexed from [id number]  Final   Culture   Final    NO GROUP A STREP (S.PYOGENES) ISOLATED Performed at [facility]    Report Status 12/09/2015 FINAL  Final  MRSA PCR Screening     Status: None   Collection Time: 12/13/15  4:25 PM  Result Value Ref Range Status   MRSA by PCR NEGATIVE NEGATIVE Final    Comment:        The GeneXpert MRSA Assay (FDA approved for NASAL specimens only), is one component of a comprehensive MRSA colonization surveillance program. It is not intended to diagnose MRSA infection nor to guide or monitor treatment for MRSA infections.   Culture, sputum-assessment     Status: None   Collection Time: 12/13/15  9:58 PM  Result Value Ref Range Status   Specimen  Description SPUTUM  Final   Special Requests Immunocompromised  Final   Sputum evaluation   Final    MICROSCOPIC FINDINGS SUGGEST THAT THIS SPECIMEN IS NOT REPRESENTATIVE OF LOWER RESPIRATORY SECRETIONS. PLEASE RECOLLECT. CALLED TO M GOIN @ 2237 ON 12/13/15 BY CDAVIS   Report Status 12/13/2015 FINAL  Final         Radiology Studies: Dg Chest 2 View  12/13/2015  CLINICAL DATA:  Worsening cough and shortness of breath.  Vomiting. EXAM: CHEST  2 VIEW COMPARISON:  12/05/2015 and 01/17/2015 FINDINGS: Chronic blunting at the right costophrenic angle. Prior median sternotomy. Stable appearance of the heart and mediastinum. Heart size is upper limits of normal. Interstitial prominence from the prior examination is less conspicuous. There continues to be prominent densities at the right lung base which may be chronic. Mild degenerative changes in the thoracic spine. IMPRESSION: Patchy densities at the right lung base are nonspecific. This could represent acute on chronic disease. Difficult to exclude a subtle infectious etiology at this location. Electronically Signed   By: Markus Daft M.D.   On: 12/13/2015 11:09        Scheduled Meds: . antiseptic oral rinse  7 mL Mouth Rinse BID  . aspirin EC  81 mg Oral QHS  . budesonide (PULMICORT) nebulizer solution  0.25 mg Nebulization BID  . clopidogrel  75 mg Oral Daily  . cycloSPORINE modified  75 mg Oral BID  . fluticasone furoate-vilanterol  1 puff Inhalation Daily  . guaiFENesin  1,200 mg Oral BID  . ipratropium-albuterol  3 mL Nebulization Q6H  . loratadine  10 mg Oral Daily  . losartan  50 mg Oral Q supper  . magnesium oxide  400 mg Oral 5 X Daily  . mycophenolate  1,500 mg Oral BID  . omega-3 acid ethyl esters  1 g Oral Daily  . pantoprazole  40 mg Oral Daily  . piperacillin-tazobactam (ZOSYN)  IV  3.375 g  Intravenous Q8H  . simvastatin  40 mg Oral Daily  . sodium chloride flush  3 mL Intravenous Q12H  . Tadalafil (PAH)  40 mg Oral Daily    Continuous Infusions: . sodium chloride 75 mL/hr at 12/13/15 1630     LOS: 1 day    Time spent: 29 mins    Annalynne Ibanez, MD [facility] Pager [phone]  If 7PM-7AM, please contact night-coverage [email or web address] Password [email or web address] 12/14/2015, 9:22 AM

## 2015-12-14 NOTE — Progress Notes (Signed)
Occupational Therapy Evaluation Patient Details Name: Luke Jacobson. MRN: [id number] DOB: 06-12-1941 Today's Date: 12/14/2015    History of Present Illness 75 y.o. male with medical history significant of heart transplantation in 1997 status post stents to the heart in 2016, chronic respiratory failure with COPD on chronic home O2 at 6-7 L nasal cannula, AAA, hypertension, hyperlipidemia who was recently hospitalized 12/05/2015 - 12/08/2015 for acute ulcerative supraglottitis and discharged on oral antibiotics presented to the ED with 1 day history of hiccups with a sensation of his throat closing up, regurgitation of clear saliva   Clinical Impression   Patient presents to OT with decreased ADL independence and safety due to the deficits listed below. He will benefit from skilled OT to maximize function and to facilitate a safe discharge. OT will follow.    Follow Up Recommendations  Home health OT;Supervision/Assistance - 24 hour (hopefully he will progress to be able to go home; if he is unable to manage may need short-term rehab/SNF prior to going home)   Equipment Recommendations  3 in 1 bedside comode    Recommendations for Other Services       Precautions / Restrictions Precautions Precaution Comments: monitor sats, chronic O2 Restrictions Weight Bearing Restrictions: No      Mobility Bed Mobility Overal bed mobility: Needs Assistance Bed Mobility: Supine to Sit     Supine to sit: Min assist     General bed mobility comments: increased time and effort  Transfers Overall transfer level: Needs assistance Equipment used: Rolling walker (2 wheeled) Transfers: Sit to/from Stand Sit to Stand: Min assist;+2 safety/equipment              Balance                                            ADL Overall ADL's : Needs assistance/impaired Eating/Feeding: Set up;Sitting   Grooming: Set up;Sitting           Upper Body Dressing : Moderate  assistance;Sitting Upper Body Dressing Details (indicate cue type and reason): due to lines/leads Lower Body Dressing: Total assistance;Sit to/from stand               Functional mobility during ADLs: Minimal assistance;+2 for safety/equipment;Rolling walker General ADL Comments: Patient worked on functional mobility during evaluation; on 10L O2 via [place], with ambulation sats dropped to 80%, recovered to 94% after bumped up to 15L briefly and resting in recliner, bumped back down to 10L at end of session with RT in to give breathing treatment. Will need education on energy conservation and to determine AE/DME needs during hospitalization. Wife still works and unclear if patient will be able to manage at home.     Vision     Perception     Praxis      Pertinent Vitals/Pain Pain Assessment: No/denies pain     Hand Dominance     Extremity/Trunk Assessment Upper Extremity Assessment Upper Extremity Assessment: Generalized weakness   Lower Extremity Assessment Lower Extremity Assessment: Defer to PT evaluation   Cervical / Trunk Assessment Cervical / Trunk Assessment: Kyphotic   Communication Communication Communication: No difficulties   Cognition Arousal/Alertness: Awake/alert Behavior During Therapy: WFL for tasks assessed/performed Overall Cognitive Status: Within Functional Limits for tasks assessed  General Comments       Exercises       Shoulder Instructions      Home Living Family/patient expects to be discharged to:: Private residence Living Arrangements: Spouse/significant other Available Help at Discharge: Family;Available PRN/intermittently (wife works during the day) Type of Home: House Home Access: Stairs to enter Technical brewer of Steps: 2 Entrance Stairs-Rails: Right Home Layout: Able to live on main level with bedroom/bathroom     Bathroom Shower/Tub: Occupational psychologist: Standard     Home  Equipment: Shower seat - built in   Additional Comments: chronic 6L O2 [place]      Prior Functioning/Environment Level of Independence: Independent        Comments: until just before hospitalization per patient. Home Health services were set up but had not started yet per patient.    OT Diagnosis: Generalized weakness   OT Problem List: Decreased strength;Decreased activity tolerance;Decreased knowledge of use of DME or AE;Cardiopulmonary status limiting activity   OT Treatment/Interventions: Self-care/ADL training;Energy conservation;DME and/or AE instruction;Therapeutic activities;Patient/family education    OT Goals(Current goals can be found in the care plan section) Acute Rehab OT Goals Patient Stated Goal: get stronger OT Goal Formulation: With patient Time For Goal Achievement: 12/28/15 Potential to Achieve Goals: Good ADL Goals Pt Will Perform Upper Body Bathing: with set-up;sitting Pt Will Perform Lower Body Bathing: with supervision;sit to/from stand Pt Will Perform Upper Body Dressing: with set-up;sitting Pt Will Perform Lower Body Dressing: with supervision;sit to/from stand Pt Will Transfer to Toilet: with supervision;ambulating;regular height toilet;bedside commode Pt Will Perform Toileting - Clothing Manipulation and hygiene: with supervision;sit to/from stand  OT Frequency: Min 3X/week   Barriers to D/C: Decreased caregiver support  wife works during the day       Co-evaluation              End of Session Equipment Utilized During Treatment: Conservation officer, nature;Oxygen Nurse Communication: Mobility status;Other (comment) (decreased O2 sats with activity)  Activity Tolerance: Patient limited by fatigue;Other (comment) (limited by decreased O2 saturations during activity) Patient left: in chair;with call bell/phone within reach;with chair alarm set;with nursing/sitter in room   Time: 7412-8786 OT Time Calculation (min): 26 min Charges:  OT General  Charges $OT Visit: 1 Procedure OT Evaluation $OT Eval Moderate Complexity: 1 Procedure G-Codes:    Shequilla Goodgame A 01-01-2016, 2:17 PM

## 2015-12-14 NOTE — Evaluation (Signed)
Physical Therapy Evaluation Patient Details Name: Luke Jacobson. MRN: [id number] DOB: [birth year] Today's Date: 12/14/2015   History of Present Illness  75 y.o. male with medical history significant of heart transplantation in 1997 status post stents to the heart in 2016, chronic respiratory failure with COPD on chronic home O2 at 6-7 L nasal cannula, AAA, hypertension, hyperlipidemia who was recently hospitalized 12/05/2015 - 12/08/2015 for acute ulcerative supraglottitis and discharged on oral antibiotics presented to the ED with 1 day history of hiccups with a sensation of his throat closing up, regurgitation of clear saliva  Clinical Impression  Pt admitted as above and presenting with functional mobility limitations 2* generalized weakness, poor endurance and desat with exertion, and ambulatory balance deficits.  Pt plans dc home with assist of spouse and would benefit from follow up HHPT to maximize IND and safety.    Follow Up Recommendations Home health PT    Equipment Recommendations  Other (comment) (TBD - possible need for RW dependent on acute stay progress)    Recommendations for Other Services OT consult     Precautions / Restrictions Precautions Precautions: Fall Precaution Comments: monitor sats, chronic O2 Restrictions Weight Bearing Restrictions: No      Mobility  Bed Mobility Overal bed mobility: Needs Assistance Bed Mobility: Supine to Sit     Supine to sit: Min assist     General bed mobility comments: increased time and effort  Transfers Overall transfer level: Needs assistance Equipment used: Rolling walker (2 wheeled) Transfers: Sit to/from Stand Sit to Stand: Min assist;+2 safety/equipment         General transfer comment: pt requires UE assist and leans posteriorly against recliner to self assist upright  Ambulation/Gait Ambulation/Gait assistance: Min assist;+2 safety/equipment Ambulation Distance (Feet): 32 Feet Assistive device:  Rolling walker (2 wheeled) Gait Pattern/deviations: Step-through pattern;Decreased step length - right;Decreased step length - left;Shuffle;Trunk flexed Gait velocity: decr Gait velocity interpretation: Below normal speed for age/gender General Gait Details: slow pace, reports weakness, multiple short standing rest breaks for breathing, SpO2 mostly 84-86% on 10L however dropped to 78% after sitting so increased O2 briefly to 15L for recovery and pt on 10L O2 at 92% upon leaving room  Stairs            Wheelchair Mobility    Modified Rankin (Stroke Patients Only)       Balance Overall balance assessment: Needs assistance Sitting-balance support: Feet supported;No upper extremity supported Sitting balance-Leahy Scale: Good     Standing balance support: No upper extremity supported Standing balance-Leahy Scale: Fair                               Pertinent Vitals/Pain Pain Assessment: No/denies pain    Home Living Family/patient expects to be discharged to:: Private residence Living Arrangements: Spouse/significant other Available Help at Discharge: Family;Available PRN/intermittently Type of Home: House Home Access: Stairs to enter Entrance Stairs-Rails: Right Entrance Stairs-Number of Steps: 2 Home Layout: Able to live on main level with bedroom/bathroom Home Equipment: Shower seat - built in Additional Comments: chronic 6L O2 [place]    Prior Function Level of Independence: Independent         Comments: until just before hospitalization per patient. Home Health services were set up but had not started yet per patient.     Hand Dominance        Extremity/Trunk Assessment   Upper Extremity Assessment: Generalized weakness  Lower Extremity Assessment: Generalized weakness      Cervical / Trunk Assessment: Kyphotic  Communication   Communication: No difficulties  Cognition Arousal/Alertness: Awake/alert Behavior During Therapy:  WFL for tasks assessed/performed Overall Cognitive Status: Within Functional Limits for tasks assessed                      General Comments      Exercises        Assessment/Plan    PT Assessment Patient needs continued PT services  PT Diagnosis Difficulty walking   PT Problem List Decreased strength;Decreased mobility;Cardiopulmonary status limiting activity;Decreased activity tolerance;Decreased balance  PT Treatment Interventions DME instruction;Gait training;Functional mobility training;Patient/family education;Therapeutic activities;Therapeutic exercise   PT Goals (Current goals can be found in the Care Plan section) Acute Rehab PT Goals Patient Stated Goal: Regain IND PT Goal Formulation: With patient Time For Goal Achievement: 12/28/15 Potential to Achieve Goals: Good    Frequency Min 3X/week   Barriers to discharge        Co-evaluation PT/OT/SLP Co-Evaluation/Treatment: Yes Reason for Co-Treatment: For patient/therapist safety PT goals addressed during session: Mobility/safety with mobility OT goals addressed during session: ADL's and self-care       End of Session Equipment Utilized During Treatment: Oxygen Activity Tolerance: Patient limited by fatigue Patient left: in chair;with call bell/phone within reach;Other (comment) (Resp therapist in room) Nurse Communication: Mobility status;Other (comment) (SaO2 with gait dropping to 78% with c/o mild dizziness)         Time: 7943-2761 PT Time Calculation (min) (ACUTE ONLY): 26 min   Charges:   PT Evaluation $PT Eval Moderate Complexity: 1 Procedure     PT G Codes:        Luke Jacobson 29-Dec-2015, 4:10 PM

## 2015-12-15 ENCOUNTER — Inpatient Hospital Stay (HOSPITAL_COMMUNITY): Payer: Medicare Other

## 2015-12-15 DIAGNOSIS — N189 Chronic kidney disease, unspecified: Secondary | ICD-10-CM

## 2015-12-15 DIAGNOSIS — N179 Acute kidney failure, unspecified: Secondary | ICD-10-CM | POA: Diagnosis not present

## 2015-12-15 LAB — URINE CULTURE: CULTURE: NO GROWTH

## 2015-12-15 LAB — MAGNESIUM: MAGNESIUM: 2 mg/dL (ref 1.7–2.4)

## 2015-12-15 LAB — CBC
HCT: 37.7 % — ABNORMAL LOW (ref 39.0–52.0)
Hemoglobin: 11.5 g/dL — ABNORMAL LOW (ref 13.0–17.0)
MCH: 29.1 pg (ref 26.0–34.0)
MCHC: 30.5 g/dL (ref 30.0–36.0)
MCV: 95.4 fL (ref 78.0–100.0)
PLATELETS: 139 10*3/uL — AB (ref 150–400)
RBC: 3.95 MIL/uL — ABNORMAL LOW (ref 4.22–5.81)
RDW: 14.1 % (ref 11.5–15.5)
WBC: 5.2 10*3/uL (ref 4.0–10.5)

## 2015-12-15 LAB — BASIC METABOLIC PANEL
Anion gap: 5 (ref 5–15)
BUN: 30 mg/dL — ABNORMAL HIGH (ref 6–20)
CALCIUM: 8.4 mg/dL — AB (ref 8.9–10.3)
CO2: 25 mmol/L (ref 22–32)
CREATININE: 2.15 mg/dL — AB (ref 0.61–1.24)
Chloride: 111 mmol/L (ref 101–111)
GFR calc non Af Amer: 29 mL/min — ABNORMAL LOW (ref >60)
GFR, EST AFRICAN AMERICAN: 33 mL/min — AB (ref >60)
Glucose, Bld: 89 mg/dL (ref 65–99)
Potassium: 4.4 mmol/L (ref 3.5–5.1)
SODIUM: 141 mmol/L (ref 135–145)

## 2015-12-15 LAB — GLUCOSE, CAPILLARY: Glucose-Capillary: 80 mg/dL (ref 65–99)

## 2015-12-15 LAB — CREATININE, URINE, RANDOM: Creatinine, Urine: 59.78 mg/dL

## 2015-12-15 LAB — SODIUM, URINE, RANDOM: SODIUM UR: 105 mmol/L

## 2015-12-15 MED ORDER — HYDRALAZINE HCL 20 MG/ML IJ SOLN: 10.0000 mg | Freq: Four times a day (QID) | INTRAMUSCULAR | Status: DC | PRN

## 2015-12-15 MED ORDER — FUROSEMIDE 10 MG/ML IJ SOLN
40.0000 mg | Freq: Two times a day (BID) | INTRAMUSCULAR | Status: DC
Start: 2015-12-15 — End: 2015-12-15
  Administered 2015-12-15: 40 mg via INTRAVENOUS
  Filled 2015-12-15: qty 4

## 2015-12-15 MED ORDER — FUROSEMIDE 10 MG/ML IJ SOLN
40.0000 mg | Freq: Two times a day (BID) | INTRAMUSCULAR | Status: DC
  Administered 2015-12-15: 40 mg via INTRAVENOUS
  Filled 2015-12-15: qty 4

## 2015-12-15 MED ORDER — AMLODIPINE BESYLATE 5 MG PO TABS: 5.0000 mg | ORAL_TABLET | Freq: Every day | ORAL | Status: DC

## 2015-12-15 MED ORDER — ENSURE ENLIVE PO LIQD
237.0000 mL | Freq: Two times a day (BID) | ORAL | Status: DC
  Administered 2015-12-15 – 2015-12-17 (×2): 237 mL via ORAL

## 2015-12-15 NOTE — Progress Notes (Signed)
Occupational Therapy Treatment Patient Details Name: Luke Jacobson. MRN: [id number] DOB: [birth year] Today's Date: 12/15/2015    History of present illness 75 y.o. male with medical history significant of heart transplantation in 1997 status post stents to the heart in 2016, chronic respiratory failure with COPD on chronic home O2 at 6-7 L nasal cannula, AAA, hypertension, hyperlipidemia who was recently hospitalized 12/05/2015 - 12/08/2015 for acute ulcerative supraglottitis and discharged on oral antibiotics presented to the ED with 1 day history of hiccups with a sensation of his throat closing up, regurgitation of clear saliva   OT comments  Progressing towards OT goals. Continue OT per plan of care.  Follow Up Recommendations  Home health OT;Supervision/Assistance - 24 hour    Equipment Recommendations  3 in 1 bedside comode    Recommendations for Other Services      Precautions / Restrictions Precautions Precautions: Fall Precaution Comments: monitor sats, chronic O2 Restrictions Weight Bearing Restrictions: No       Mobility Bed Mobility                  Transfers                      Balance                                   ADL                                         General ADL Comments: Issued energy conservation handout to patient and initiated education on energy conservation techniques. Patient needs further education. Patient reports feeling "about the same as yesterday" and just very fatigued. He reports he sat up in recliner "for a while" yesterday. Plans to get up to recliner again with nursing after he rests. Reports he did not sleep well last night. OT will continue to follow for energy conservation and to determine any AE/DME needs for home.       Vision                     Perception     Praxis      Cognition   Behavior During Therapy: WFL for tasks assessed/performed Overall  Cognitive Status: Within Functional Limits for tasks assessed                       Extremity/Trunk Assessment               Exercises     Shoulder Instructions       General Comments      Pertinent Vitals/ Pain       Pain Assessment: No/denies pain  Home Living                                          Prior Functioning/Environment              Frequency Min 3X/week     Progress Toward Goals  OT Goals(current goals can now be found in the care plan section)  Progress towards OT goals: Progressing toward goals  Acute Rehab OT Goals Patient Stated Goal: [place] Discharge  plan remains appropriate    Co-evaluation                 End of Session Equipment Utilized During Treatment: Oxygen   Activity Tolerance Patient limited by fatigue   Patient Left in bed;with call bell/phone within reach;with bed alarm set   Nurse Communication          Time: 4417-1278 OT Time Calculation (min): 9 min  Charges: OT General Charges $OT Visit: 1 Procedure OT Treatments $Self Care/Home Management : 8-22 mins  Candelario Steppe A 12/15/2015, 9:48 AM

## 2015-12-15 NOTE — Progress Notes (Signed)
CPT held. Patient resting in chair. Will perform at a later time.

## 2015-12-15 NOTE — Progress Notes (Signed)
Initial Nutrition Assessment  DOCUMENTATION CODES:   Severe malnutrition in context of chronic illness  INTERVENTION:   Provide Ensure Enlive po BID, each supplement provides 350 kcal and 20 grams of protein Encourage PO intake RD to continue to monitor  NUTRITION DIAGNOSIS:   Malnutrition related to chronic illness as evidenced by percent weight loss, severe depletion of body fat, severe depletion of muscle mass.  GOAL:   Patient will meet greater than or equal to 90% of their needs  MONITOR:   PO intake, Supplement acceptance, Labs, Weight trends, I & O's  REASON FOR ASSESSMENT:   Malnutrition Screening Tool    ASSESSMENT:   75 y.o. male with medical history significant of heart transplantation in 1997 status post stents to the heart in 2016, chronic respiratory failure with COPD on chronic home O2 at 6-7 L nasal cannula, AAA, hypertension, hyperlipidemia who was recently hospitalized 12/05/2015 - 12/08/2015 for acute ulcerative supraglottitis and discharged on oral antibiotics presented to the ED with 1 day history of pickups with a sensation of his throat closing up, regurgitation of clear saliva and a feeling like there is a narrowing as he tries to swallow food down his esophagus with associated nausea, multiple episodes of emesis one day prior to admission, chills, low-grade fever with a temp of 100, and diarrhea.   Patient in room with full liquid tray. Pt states he has eaten all of his yogurt and juice. He has had a few sips of soup and he is working on his coffee. Pt tolerated clear liquids yesterday. Pt was recently admitted 1 week prior for similar symptoms. Pt with sore throat and dysphagia. Pt states that in the past week he was unable to keep any food down. Pt was ordered Ensure supplements during his last admission, he is agreeable to receiving them again. RD will order Ensure supplements BID.  Patient continues to lose weight, he has lost 10 lb since 3/8 (8% wt  loss x 3 months, significant for time frame). Nutrition-Focused physical exam completed. Findings are severe fat depletion, severe muscle depletion, and no edema.   Labs reviewed. Medications: IV Lasix every 12 hours, MAG-OX tablet 5 x daily  Diet Order:  Diet full liquid Room service appropriate?: Yes; Fluid consistency:: Thin  Skin:  Reviewed, no issues  Last BM:  PTA  Height:   Ht Readings from Last 1 Encounters:  12/13/15 _0  (1.702 m)    Weight:   Wt Readings from Last 1 Encounters:  12/13/15 123 lb 14.4 oz (56.2 kg)    Ideal Body Weight:  67.72 kg  BMI:  Body mass index is 19.4 kg/(m^2).  Estimated Nutritional Needs:   Kcal:  1550-1750  Protein:  65-75g  Fluid:  1.8L/day  EDUCATION NEEDS:   No education needs identified at this time  Clayton Bibles, MS, RD, LDN Pager: [phone] After Hours Pager: [phone]

## 2015-12-15 NOTE — Progress Notes (Signed)
PROGRESS NOTE    Luke Jacobson.  ZOX:[id number] DOB: [birth year] DOA: 12/13/2015 PCP: Gara Kroner, MD   Brief Narrative:  Luke Jacobson. is a 75 y.o. male with medical history significant of heart transplantation in 1997 status post stents to the heart in 2016, chronic respiratory failure with COPD on chronic home O2 at 6-7 L nasal cannula, AAA, hypertension, hyperlipidemia who was recently hospitalized 12/05/2015 - 12/08/2015 for acute ulcerative supraglottitis and discharged on oral antibiotics presented to the ED with 1 day history of pickups with a sensation of his throat closing up, regurgitation of clear saliva and a feeling like there is a narrowing as he tries to swallow food down his esophagus with associated nausea, multiple episodes of emesis one day prior to admission, chills, low-grade fever with a temp of 100, and diarrhea. Patient denies any chest pain, no change in chronic shortness of breath, no stridor, no abdominal pain, no constipation, no dysuria, no melena, no hematemesis, no hematochezia, no voice changes, no visual changes, no gait abnormalities. Patient was brought to the ED per EMS and per ED note when EMS arrived patient was noted to set at 65% on room air was subsequently placed on a nonrebreather and brought to the emergency room. Patient also with ongoing cough which is unchanged since discharge. Patient denied any lower extremity edema.   Assessment & Plan:   Principal Problem:   Regurgitation Active Problems:   HCAP (healthcare-associated pneumonia)   Aspiration pneumonia (HCC)   CAD (coronary artery disease)   H/O heart transplant ([place])   Hypertension   Pulmonary hypertension (HCC)   ILD (interstitial lung disease) ([place])   Hiccups   CKD (chronic kidney disease), stage III   Odynophagia   Dysphagia   #1 regurgitation/dysphagia/odynophagia/Hiccups Questionable etiology. Concern for an GI etiology. Patient recently hospitalized and treated for  acute ulcerative supraglottitis and as such there was concern and ENT consulted. Patient underwent laryngoscope on 12/13/2015 which was unremarkable. Laryngoscope showed clinical improvement from admission. Patient with no stridor. No change in voice. Patient was also seen in consultation by gastroenterology. Patient underwent a water-soluble esophagram 12/14/2015 which showed ulceration in the middle third of the esophagus at the level of the left mainstem bronchus, nonspecific esophageal dysmotility, small hiatal hernia. Patient has been placed on IV Diflucan and Protonix increased to twice a day per GI recommendations. GI recommended conservative approach and slowly advancing diet. Continue empiric IV antibiotics for pneumonia. Continue supportive care. Follow.  #2 healthcare associated pneumonia likely secondary to Pseudomonas  versus aspiration pneumonia The chest x-ray. Patient is afebrile. Patient with some increased O2 requirements. Patient with increased O2 requirements per RT. Patient with some coarse breath sounds and some crackles in the right base. Patient on immunosuppressive medications secondary to cardiac transplant. Sputum Gram stain and culture from 12/06/2015 was consistent with moderate pseudomonas aeruginosa. Blood cultures are pending. Urine pneumococcus antigen is negative. Urine Legionella antigen is negative. Check a chest x-ray. Continue oxygen, scheduled nebulizers, Mucinex, Claritin, IV Zosyn. Chest PT. Follow.  #3 coronary artery disease/history of cardiac transplant Stable. Continue home regimen of immunosuppressive medications of cyclosporine, CellCept, Cozaar, aspirin, Plavix.  #4 acute on chronic kidney disease stage III Worsening renal function. Creatinine currently at 2.15 from 1.67 yesterday from 1.31. Patient states decreased urine output over the past 24 hours. Likely a prerenal azotemia. Check a chest x-ray. Check a urine sodium. Check a urine creatinine. Saline lock  IV fluids. May need diuresis however will await  chest x-ray results. Patient is +2.3 L over the past 24 hours. Lasix 40 mg IV 1. Hold ARB. Stable.  #5 chronic respiratory failure on 6-7 L nasal cannula at home/interstitial lung disease Per RT patient with some increased O2 requirements on high flow O2. Will get a chest x-ray. Continue empiric IV antibiotics, Mucinex. Continue home regimen of nebulizers. Chest PT. See #2.  #6 hiccups May be secondary to pneumonia with irritation of the phrenic nerve versus esophageal etiology. Patient will esophagram 12/14/2015 which showed no large Tzanck's diverticulum identified. Question ulceration in the middle third of the esophagus at the level of the left mainstem bronchus. Nonspecific esophageal dysmotility. Small hiatial hernia. Clinical improvement. Continue empiric IV antibiotics. Thorazine as needed.     DVT prophylaxis: SCDs Code Status: Full Family Communication: Updated patient no family at bedside. Disposition Plan: Remain the step down unit today. Home when medically stable and dysphagia/odynophagia resolved or improved and per GI.   Consultants:   ENT: Dr. Wilburn Cornelia 12/13/2015  Gastroenterology: Dr. Paulita Fujita 12/13/2015  Procedures:   Chest x-ray 12/13/2015  Water soluble esophagram 12/14/2015  Antimicrobials:   IV Zosyn 12/13/2015   Subjective: Patient states not feeling too well this morning. Tolerated clears yesterday. No CP. Patient per RT with increased oxygen requirements earlier on. Patient c/o inability to get mucus up. Patient states some improvement with pickups although still present.  Objective: Filed Vitals:   12/15/15 0600 12/15/15 0800 12/15/15 0834 12/15/15 0846  BP: 143/76 81/48 81/48   Pulse: 66 83 67   Temp:  98.3 F (36.8 C)    TempSrc:  Oral    Resp: 32 22 18   Height:      Weight:      SpO2: 95% 92% 94% 94%    Intake/Output Summary (Last 24 hours) at 12/15/15 0850 Last data filed at 12/15/15  0800  Gross per 24 hour  Intake   1775 ml  Output    900 ml  Net    875 ml   Filed Weights   12/13/15 1625  Weight: 56.2 kg (123 lb 14.4 oz)    Examination:  General exam: Appears calm and comfortable. Getting breathing treatment. Respiratory system: Some coarse BS and crackles2 in right base. Respiratory effort normal. Cardiovascular system: S1 & S2 heard, RRR. No JVD, murmurs, rubs, gallops or clicks. No pedal edema. Gastrointestinal system: Abdomen is nondistended, soft and nontender. No organomegaly or masses felt. Normal bowel sounds heard. Central nervous system: Alert and oriented. No focal neurological deficits. Extremities: Symmetric 5 x 5 power. Skin: No rashes, lesions or ulcers Psychiatry: Judgement and insight appear normal. Mood & affect appropriate.     Data Reviewed: I have personally reviewed following labs and imaging studies  CBC:  Recent Labs Lab 12/13/15 1032 12/14/15 0338 12/15/15 0327  WBC 5.8 4.9 5.2  NEUTROABS 4.8  --   --   HGB 12.9* 10.9* 11.5*  HCT 40.6 35.8* 37.7*  MCV 93.1 95.0 95.4  PLT 169 135* 881*   Basic Metabolic Panel:  Recent Labs Lab 12/13/15 1032 12/13/15 1646 12/14/15 0338 12/15/15 0327  NA 142  --  142 141  K 4.1  --  4.2 4.4  CL 110  --  112* 111  CO2 26  --  24 25  GLUCOSE 111*  --  88 89  BUN 31*  --  28* 30*  CREATININE 1.31*  --  1.67* 2.15*  CALCIUM 9.0  --  8.4* 8.4*  MG  --  2.1 2.0 2.0   GFR: Estimated Creatinine Clearance: 24.3 mL/min (by C-G formula based on Cr of 2.15). Liver Function Tests:  Recent Labs Lab 12/13/15 1032 12/14/15 0338  AST 26 21  ALT 19 14*  ALKPHOS 66 56  BILITOT 1.1 1.2  PROT 7.1 5.6*  ALBUMIN 3.2* 2.5*   No results for input(s): LIPASE, AMYLASE in the last 168 hours. No results for input(s): AMMONIA in the last 168 hours. Coagulation Profile: No results for input(s): INR, PROTIME in the last 168 hours. Cardiac Enzymes: No results for input(s): CKTOTAL, CKMB,  CKMBINDEX, TROPONINI in the last 168 hours. BNP (last 3 results) No results for input(s): PROBNP in the last 8760 hours. HbA1C: No results for input(s): HGBA1C in the last 72 hours. CBG:  Recent Labs Lab 12/14/15 0754  GLUCAP 83   Lipid Profile: No results for input(s): CHOL, HDL, LDLCALC, TRIG, CHOLHDL, LDLDIRECT in the last 72 hours. Thyroid Function Tests: No results for input(s): TSH, T4TOTAL, FREET4, T3FREE, THYROIDAB in the last 72 hours. Anemia Panel: No results for input(s): VITAMINB12, FOLATE, FERRITIN, TIBC, IRON, RETICCTPCT in the last 72 hours. Sepsis Labs: No results for input(s): PROCALCITON, LATICACIDVEN in the last 168 hours.  Recent Results (from the past 240 hour(s))  Blood culture (routine x 2)     Status: None   Collection Time: 12/05/15  9:49 PM  Result Value Ref Range Status   Specimen Description RIGHT ANTECUBITAL  Final   Special Requests BOTTLES DRAWN AEROBIC AND ANAEROBIC 5CC  Final   Culture   Final    NO GROWTH 5 DAYS Performed at [facility]    Report Status 12/10/2015 FINAL  Final  Blood culture (routine x 2)     Status: None   Collection Time: 12/05/15  9:49 PM  Result Value Ref Range Status   Specimen Description BLOOD RIGHT HAND  Final   Special Requests BOTTLES DRAWN AEROBIC ONLY 5CC  Final   Culture   Final    NO GROWTH 5 DAYS Performed at [facility]    Report Status 12/10/2015 FINAL  Final  Respiratory Panel by PCR     Status: None   Collection Time: 12/05/15 11:55 PM  Result Value Ref Range Status   Adenovirus NOT DETECTED NOT DETECTED Final   Coronavirus 229E NOT DETECTED NOT DETECTED Final   Coronavirus HKU1 NOT DETECTED NOT DETECTED Final   Coronavirus NL63 NOT DETECTED NOT DETECTED Final   Coronavirus OC43 NOT DETECTED NOT DETECTED Final   Metapneumovirus NOT DETECTED NOT DETECTED Final   Rhinovirus / Enterovirus NOT DETECTED NOT DETECTED Final   Influenza A NOT DETECTED NOT DETECTED Final   Influenza A H1  NOT DETECTED NOT DETECTED Final   Influenza A H1 2009 NOT DETECTED NOT DETECTED Final   Influenza A H3 NOT DETECTED NOT DETECTED Final   Influenza B NOT DETECTED NOT DETECTED Final   Parainfluenza Virus 1 NOT DETECTED NOT DETECTED Final   Parainfluenza Virus 2 NOT DETECTED NOT DETECTED Final   Parainfluenza Virus 3 NOT DETECTED NOT DETECTED Final   Parainfluenza Virus 4 NOT DETECTED NOT DETECTED Final   Respiratory Syncytial Virus NOT DETECTED NOT DETECTED Final   Bordetella pertussis NOT DETECTED NOT DETECTED Final   Chlamydophila pneumoniae NOT DETECTED NOT DETECTED Final   Mycoplasma pneumoniae NOT DETECTED NOT DETECTED Final  Culture, sputum-assessment     Status: None   Collection Time: 12/06/15  1:34 AM  Result Value Ref Range Status   Specimen  Description SPUTUM  Final   Special Requests Immunocompromised  Final   Sputum evaluation   Final    THIS SPECIMEN IS ACCEPTABLE. RESPIRATORY CULTURE REPORT TO FOLLOW.   Report Status 12/06/2015 FINAL  Final  Culture, respiratory (NON-Expectorated)     Status: None   Collection Time: 12/06/15  1:34 AM  Result Value Ref Range Status   Specimen Description SPUTUM  Final   Special Requests NONE  Final   Gram Stain   Final    ABUNDANT WBC PRESENT, PREDOMINANTLY PMN MODERATE GRAM NEGATIVE RODS FEW GRAM POSITIVE COCCI IN PAIRS    Culture MODERATE PSEUDOMONAS AERUGINOSA  Final   Report Status 12/09/2015 FINAL  Final   Organism ID, Bacteria PSEUDOMONAS AERUGINOSA  Final      Susceptibility   Pseudomonas aeruginosa - MIC*    CEFTAZIDIME 4 SENSITIVE Sensitive     CIPROFLOXACIN <=0.25 SENSITIVE Sensitive     GENTAMICIN <=1 SENSITIVE Sensitive     IMIPENEM 1 SENSITIVE Sensitive     PIP/TAZO 8 SENSITIVE Sensitive     CEFEPIME 4 SENSITIVE Sensitive     * MODERATE PSEUDOMONAS AERUGINOSA  Rapid strep screen (not at [facility])     Status: None   Collection Time: 12/06/15 11:53 AM  Result Value Ref Range Status   Streptococcus, Group A Screen  (Direct) NEGATIVE NEGATIVE Final    Comment: (NOTE) A Rapid Antigen test may result negative if the antigen level in the sample is below the detection level of this test. The FDA has not cleared this test as a stand-alone test therefore the rapid antigen negative result has reflexed to a Group A Strep culture.   Culture, group A strep     Status: None   Collection Time: 12/06/15 11:53 AM  Result Value Ref Range Status   Specimen Description THROAT  Final   Special Requests NONE Reflexed from [id number]  Final   Culture   Final    NO GROUP A STREP (S.PYOGENES) ISOLATED Performed at [facility]    Report Status 12/09/2015 FINAL  Final  MRSA PCR Screening     Status: None   Collection Time: 12/13/15  4:25 PM  Result Value Ref Range Status   MRSA by PCR NEGATIVE NEGATIVE Final    Comment:        The GeneXpert MRSA Assay (FDA approved for NASAL specimens only), is one component of a comprehensive MRSA colonization surveillance program. It is not intended to diagnose MRSA infection nor to guide or monitor treatment for MRSA infections.   Culture, blood (routine x 2) Call MD if unable to obtain prior to antibiotics being given     Status: None (Preliminary result)   Collection Time: 12/13/15  4:46 PM  Result Value Ref Range Status   Specimen Description BLOOD LEFT ARM  Final   Special Requests BOTTLES DRAWN AEROBIC AND ANAEROBIC 5CC  Final   Culture   Final    NO GROWTH < 24 HOURS Performed at [facility]    Report Status PENDING  Incomplete  Culture, blood (routine x 2) Call MD if unable to obtain prior to antibiotics being given     Status: None (Preliminary result)   Collection Time: 12/13/15  4:46 PM  Result Value Ref Range Status   Specimen Description BLOOD LEFT ARM  Final   Special Requests BOTTLES DRAWN AEROBIC AND ANAEROBIC 5CC  Final   Culture   Final    NO GROWTH < 24 HOURS Performed at [facility]    Report Status PENDING  Incomplete  Urine  culture     Status: None   Collection Time: 12/13/15  7:14 PM  Result Value Ref Range Status   Specimen Description URINE, CLEAN CATCH  Final   Special Requests NONE  Final   Culture NO GROWTH Performed at [facility]   Final   Report Status 12/15/2015 FINAL  Final  Culture, sputum-assessment     Status: None   Collection Time: 12/13/15  9:58 PM  Result Value Ref Range Status   Specimen Description SPUTUM  Final   Special Requests Immunocompromised  Final   Sputum evaluation   Final    MICROSCOPIC FINDINGS SUGGEST THAT THIS SPECIMEN IS NOT REPRESENTATIVE OF LOWER RESPIRATORY SECRETIONS. PLEASE RECOLLECT. CALLED TO M GOIN @ 2237 ON 12/13/15 BY CDAVIS   Report Status 12/13/2015 FINAL  Final         Radiology Studies: Dg Chest 2 View  12/13/2015  CLINICAL DATA:  Worsening cough and shortness of breath.  Vomiting. EXAM: CHEST  2 VIEW COMPARISON:  12/05/2015 and 01/17/2015 FINDINGS: Chronic blunting at the right costophrenic angle. Prior median sternotomy. Stable appearance of the heart and mediastinum. Heart size is upper limits of normal. Interstitial prominence from the prior examination is less conspicuous. There continues to be prominent densities at the right lung base which may be chronic. Mild degenerative changes in the thoracic spine. IMPRESSION: Patchy densities at the right lung base are nonspecific. This could represent acute on chronic disease. Difficult to exclude a subtle infectious etiology at this location. Electronically Signed   By: Markus Daft M.D.   On: 12/13/2015 11:09   Dg Esophagus W/water Sol Cm  12/14/2015  CLINICAL DATA:  75 year old male with a history of dysphagia/ odynophagia, nausea/vomiting. Patient has a recent laryngoscopy, with erythema/irritation at the arytenoid cartilage. Performed 12/13/2015. EXAM: ESOPHOGRAM/BARIUM SWALLOW TECHNIQUE: Single contrast examination was performed using both thin barium and water-soluble contrast. FLUOROSCOPY TIME:   Fluoroscopy Time:  2 minutes, 18 seconds COMPARISON:  None. FINDINGS: Limited esophagram for purposes of evaluation of odynophagia. Rapid sequence evaluation of swallowing of the contrast bolus performed in AP position with water soluble and oblique position with thin barium. No aspiration identified. Contrast bolus traverses the pharynx unremarkably. Oblique image limited for evaluation of the posterior contour of the hypopharynx/ upper esophagus, however, no large Zenker's diverticulum identified. Series 4 demonstrates questionable ulceration along the anterior lateral of the middle third of the esophagus at the level of the left mainstem bronchus. Nonspecific dysmotility. Small hiatal hernia. IMPRESSION: Pharyngeal phase of the contrast bolus unremarkable, with no large Zenker's diverticulum identified. Questionable ulceration in the middle third of the esophagus at the level of the left mainstem bronchus (series 4). Correlation with upper endoscopy may be useful. Nonspecific esophageal dysmotility. Small hiatal hernia. Signed, Dulcy Fanny. Earleen Newport, DO [facility] Radiology Electronically Signed   By: Corrie Mckusick D.O.   On: 12/14/2015 12:10        Scheduled Meds: . antiseptic oral rinse  7 mL Mouth Rinse BID  . aspirin EC  81 mg Oral QHS  . budesonide (PULMICORT) nebulizer solution  0.25 mg Nebulization BID  . clopidogrel  75 mg Oral Daily  . cycloSPORINE modified  75 mg Oral BID  . fluconazole (DIFLUCAN) IV  200 mg Intravenous Q24H  . fluticasone furoate-vilanterol  1 puff Inhalation Daily  . guaiFENesin  1,200 mg Oral BID  . ipratropium-albuterol  3 mL  Nebulization TID  . loratadine  10 mg Oral Daily  . losartan  50 mg Oral Q supper  . magnesium oxide  400 mg Oral 5 X Daily  . mycophenolate  1,500 mg Oral BID  . omega-3 acid ethyl esters  1 g Oral Daily  . pantoprazole  40 mg Oral BID  . piperacillin-tazobactam (ZOSYN)  IV  3.375 g Intravenous  Q8H  . simvastatin  40 mg Oral Daily  . sodium chloride flush  3 mL Intravenous Q12H  . Tadalafil (PAH)  40 mg Oral Daily   Continuous Infusions:     LOS: 2 days    Time spent: 33 mins    THOMPSON,DANIEL, MD [facility] Pager [phone]  If 7PM-7AM, please contact night-coverage [email or web address] Password [email or web address] 12/15/2015, 8:50 AM

## 2015-12-16 ENCOUNTER — Inpatient Hospital Stay (HOSPITAL_COMMUNITY): Payer: Medicare Other

## 2015-12-16 DIAGNOSIS — I509 Heart failure, unspecified: Secondary | ICD-10-CM

## 2015-12-16 DIAGNOSIS — J432 Centrilobular emphysema: Secondary | ICD-10-CM

## 2015-12-16 DIAGNOSIS — J151 Pneumonia due to Pseudomonas: Secondary | ICD-10-CM

## 2015-12-16 DIAGNOSIS — J189 Pneumonia, unspecified organism: Secondary | ICD-10-CM

## 2015-12-16 DIAGNOSIS — N179 Acute kidney failure, unspecified: Secondary | ICD-10-CM

## 2015-12-16 DIAGNOSIS — Z941 Heart transplant status: Secondary | ICD-10-CM

## 2015-12-16 DIAGNOSIS — J9620 Acute and chronic respiratory failure, unspecified whether with hypoxia or hypercapnia: Secondary | ICD-10-CM | POA: Diagnosis present

## 2015-12-16 DIAGNOSIS — N189 Chronic kidney disease, unspecified: Secondary | ICD-10-CM

## 2015-12-16 DIAGNOSIS — I272 Other secondary pulmonary hypertension: Secondary | ICD-10-CM

## 2015-12-16 DIAGNOSIS — J9621 Acute and chronic respiratory failure with hypoxia: Secondary | ICD-10-CM

## 2015-12-16 DIAGNOSIS — I5033 Acute on chronic diastolic (congestive) heart failure: Secondary | ICD-10-CM

## 2015-12-16 DIAGNOSIS — R066 Hiccough: Secondary | ICD-10-CM

## 2015-12-16 DIAGNOSIS — N183 Chronic kidney disease, stage 3 (moderate): Secondary | ICD-10-CM

## 2015-12-16 LAB — CBC WITH DIFFERENTIAL/PLATELET
BASOS ABS: 0 10*3/uL (ref 0.0–0.1)
BASOS PCT: 0 %
EOS ABS: 0.2 10*3/uL (ref 0.0–0.7)
Eosinophils Relative: 3 %
HEMATOCRIT: 37.5 % — AB (ref 39.0–52.0)
HEMOGLOBIN: 11.9 g/dL — AB (ref 13.0–17.0)
Lymphocytes Relative: 13 %
Lymphs Abs: 0.7 10*3/uL (ref 0.7–4.0)
MCH: 29.3 pg (ref 26.0–34.0)
MCHC: 31.7 g/dL (ref 30.0–36.0)
MCV: 92.4 fL (ref 78.0–100.0)
MONO ABS: 0.6 10*3/uL (ref 0.1–1.0)
MONOS PCT: 11 %
NEUTROS ABS: 3.6 10*3/uL (ref 1.7–7.7)
NEUTROS PCT: 73 %
Platelets: 144 10*3/uL — ABNORMAL LOW (ref 150–400)
RBC: 4.06 MIL/uL — ABNORMAL LOW (ref 4.22–5.81)
RDW: 13.8 % (ref 11.5–15.5)
WBC: 5 10*3/uL (ref 4.0–10.5)

## 2015-12-16 LAB — TROPONIN I
TROPONIN I: 0.03 ng/mL (ref <0.031)
TROPONIN I: 0.03 ng/mL (ref <0.031)
Troponin I: 0.03 ng/mL (ref <0.031)

## 2015-12-16 LAB — ECHOCARDIOGRAM COMPLETE
AVPHT: 417 ms
CHL CUP PV REG GRAD DIAS: 12 mmHg
E/e' ratio: 6.08
EWDT: 176 ms
FS: 26 % — AB (ref 28–44)
HEIGHTINCHES: 67 in
IVS/LV PW RATIO, ED: 0.9
LA ID, A-P, ES: 37 cm
LA diam end sys: 37 cm
LA diam index: 2.24 cm/m2
LA vol A4C: 75.2 ml
LA vol index: 55.8 mL/m2
LAVOL: 92.1 cm3
LV E/e' medial: 6.08
LV E/e'average: 6.08
LV e' LATERAL: 10.1 cm/s
MV Dec: 176
MVPKAVEL: 49.4 m/s
MVPKEVEL: 61.4 m/s
PV Reg vel dias: 176 cm/s
PW: 18 mm — AB (ref 0.6–1.1)
RV TAPSE: 18 cm
TDI e' lateral: 10.1
TDI e' medial: 4.03
WEIGHTICAEL: 1982.38 [oz_av]

## 2015-12-16 LAB — BASIC METABOLIC PANEL
ANION GAP: 8 (ref 5–15)
BUN: 31 mg/dL — ABNORMAL HIGH (ref 6–20)
CALCIUM: 8.6 mg/dL — AB (ref 8.9–10.3)
CO2: 26 mmol/L (ref 22–32)
CREATININE: 2.11 mg/dL — AB (ref 0.61–1.24)
Chloride: 108 mmol/L (ref 101–111)
GFR, EST AFRICAN AMERICAN: 34 mL/min — AB (ref >60)
GFR, EST NON AFRICAN AMERICAN: 29 mL/min — AB (ref >60)
Glucose, Bld: 94 mg/dL (ref 65–99)
Potassium: 3.7 mmol/L (ref 3.5–5.1)
Sodium: 142 mmol/L (ref 135–145)

## 2015-12-16 LAB — MAGNESIUM: Magnesium: 1.7 mg/dL (ref 1.7–2.4)

## 2015-12-16 LAB — BRAIN NATRIURETIC PEPTIDE: B Natriuretic Peptide: 144.8 pg/mL — ABNORMAL HIGH (ref 0.0–100.0)

## 2015-12-16 LAB — GLUCOSE, CAPILLARY: GLUCOSE-CAPILLARY: 90 mg/dL (ref 65–99)

## 2015-12-16 MED ORDER — FUROSEMIDE 10 MG/ML IJ SOLN
60.0000 mg | Freq: Two times a day (BID) | INTRAMUSCULAR | Status: DC
  Administered 2015-12-16: 60 mg via INTRAVENOUS
  Filled 2015-12-16: qty 6

## 2015-12-16 MED ORDER — FUROSEMIDE 40 MG PO TABS
40.0000 mg | ORAL_TABLET | Freq: Every day | ORAL | Status: DC
  Administered 2015-12-17 – 2015-12-18 (×2): 40 mg via ORAL
  Filled 2015-12-16 (×2): qty 1

## 2015-12-16 MED ORDER — DEXTROSE 5 % IV SOLN
2.0000 g | INTRAVENOUS | Status: DC
  Administered 2015-12-16: 2 g via INTRAVENOUS
  Filled 2015-12-16 (×2): qty 2

## 2015-12-16 NOTE — Progress Notes (Signed)
*  PRELIMINARY RESULTS* Echocardiogram 2D Echocardiogram has been performed.  Leavy Cella 12/16/2015, 9:58 AM

## 2015-12-16 NOTE — Progress Notes (Addendum)
Pharmacy Antibiotic Note  Luke Jacobson. is a 75 y.o. male admitted on 12/13/2015 with pneumonia.  He was admitted 5/25-5/28 for acute laryngitis/supraglottitis and started on Unasyn per ID recommendation.  This was changed to Augmentin at discharge and he is on D9 antibiotics.  However, sputum cx grew out Pseudomonas which is not covered by his antibiotic regimen.  He is re-admitted today with worsening symptoms of dysphagia.  Pharmacy has been consulted for Zosyn dosing.  Today, 12/16/2015 Day #4 Zosyn Tmax 99.4 WBC remain WNL SCr elevated but unchanged overnight, CrCl 25 ml/min CXR 6/4 shows worsening CHF pattern  Plan:  Continue Zosyn 3.375gm IV q8h (4hr extended infusions)  Monitor renal function and patient progress  Height: _0  (170.2 cm) Weight: 123 lb 14.4 oz (56.2 kg) IBW/kg (Calculated) : 66.1  Temp (24hrs), Avg:98.8 F (37.1 C), Min:98.3 F (36.8 C), Max:99.4 F (37.4 C)   Recent Labs Lab 12/13/15 1032 12/14/15 0338 12/15/15 0327 12/16/15 0353  WBC 5.8 4.9 5.2 5.0  CREATININE 1.31* 1.67* 2.15* 2.11*    Estimated Creatinine Clearance: 24.8 mL/min (by C-G formula based on Cr of 2.11).    Allergies  Allergen Reactions  . Ambrisentan Other (See Comments), Itching and Swelling  . Heparin     Made white blood count go down. HIT    Antimicrobials this admission: 5/25 >> Levaquin >> 5/26 5/26 >> Vanc >> 5/26 5/26 >> Unasyn >>5/28 5/28 augmentin (pharyngitis)>>6/2 6/2 Zosyn >>  6/3 Fluconazole >>  Dose adjustments this admission: ---  Microbiology results: 5/26 Sputum: PSA (sens Zosyn, Cefepime, Cipro, Imipenem)  6/2 sputum: not a good sample 6/2 Bcx: ngtd 6/2 UCx: NGF 6/2 MRSA PCR: neg 6/2 strep pneumo/legionella ur ag: neg/neg 6/2 HIV screen: NR  Thank you for allowing pharmacy to be a part of this patient's care.  Peggyann Juba, PharmD, BCPS Pager: [phone]  12/16/2015 7:36 AM   Addendum: Consulted to narrow Zosyn to Cefepime.  For  CrCl 25 ml/min, will start Cefepime 2g q24h for CrCl 11-29 ml/min.  If SCr continues to improve, will increase dose to 2g q12h.  Peggyann Juba, PharmD, BCPS 12/16/2015 9:19 AM

## 2015-12-16 NOTE — Progress Notes (Signed)
Date:  December 16, 2015 Chart reviewed for concurrent status and case management needs. Will continue to follow the patient for changes and needs:  High flow 02 Expected discharge date: [id number] [name], [place], [place], [place]

## 2015-12-16 NOTE — Consult Note (Signed)
Name: Luke Jacobson. MRN: [id number] DOB: 03/14/1941    ADMISSION DATE:  12/13/2015 CONSULTATION DATE:  6/5  REFERRING MD :  Grandville Silos   CHIEF COMPLAINT:  Acute on Chronic Respiratory Failure   BRIEF PATIENT DESCRIPTION:  75 year old male w/ chronic respiratory failure in the setting of mod-severe COPD and secondary PAH in setting of prior cardiac transplant in 25. Admitted 6/2 w/ ulcerative esophagitis (presumed candida) w/ active reflux and probable aspiration. Developed increased O2 requirements on 6/4 so PCCM asked to assess.   SIGNIFICANT EVENTS    STUDIES:  ECHO 6/5>>>   HISTORY OF PRESENT ILLNESS:   75 y.o. male with medical history significant of heart transplantation in 1997 status post stents to the heart in 2016, chronic respiratory failure with COPD on chronic home O2 at 6-7 L nasal cannula, AAA, hypertension, hyperlipidemia who was recently hospitalized 12/05/2015 - 12/08/2015 for acute ulcerative supraglottitis and discharged on oral antibiotics presented to the ED on 6/2 with 1 day history of hick-ups with a sensation of his throat closing up, regurgitation of clear saliva and a feeling like there is a narrowing as he tried to swallow food down his esophagus with associated nausea, multiple episodes of emesis one day prior to admission, chills, low-grade fever with a temp of 100, and diarrhea. On admit he denied chest pain, no change in chronic shortness of breath, no stridor, no abdominal pain, no constipation, no dysuria, no melena, no hematemesis, no hematochezia, no voice changes, no visual changes, no gait abnormalities. On EMS arrival his O2 sats were 65% on room air was subsequently placed on a nonrebreather and brought to the emergency room. Patient also with ongoing cough which is unchanged since discharge. Patient denied any lower extremity edema. He was admitted to the hospital service. GI, ENT and ID services were consulted. He was found to have what appeared to  be esophageal ulcer and has been treated as candida esophagitis. He has also been started on IV antibiotics for sputum culture on last admit which showed pseudomonas. He had been at baseline from pulm stand-point until he developed worsening hypoxia on 6/4 for which PCCM was consulted.   PAST MEDICAL HISTORY :   has a past medical history of Hypertension; Shortness of breath; Hypercholesteremia; HIT (heparin-induced thrombocytopenia) (HCC); COPD (chronic obstructive pulmonary disease) ([place]); AAA (abdominal aortic aneurysm) ([place]); and Arrhythmia (has had episodes of fast heart rate).  has past surgical history that includes Heart transplant and heart stents (Sept 2016). Prior to Admission medications   Medication Sig Start Date End Date Taking? Authorizing Provider  acetaminophen (TYLENOL) 500 MG tablet Take 500-1,000 mg by mouth daily as needed for fever.    Yes Historical Provider, MD  ADCIRCA 20 MG TABS Take 40 mg by mouth daily.    Yes Historical Provider, MD  albuterol (PROAIR HFA) 108 (90 Base) MCG/ACT inhaler Inhale 2 puffs into the lungs every 6 (six) hours as needed. 09/18/15  Yes Rigoberto Noel, MD  amoxicillin-clavulanate (AUGMENTIN) 875-125 MG tablet Take 1 tablet by mouth every 12 (twelve) hours. 12/08/15  Yes Thurnell Lose, MD  aspirin EC 81 MG tablet Take 81 mg by mouth at bedtime.    Yes Historical Provider, MD  clopidogrel (PLAVIX) 75 MG tablet Take 75 mg by mouth daily. 10/17/15  Yes Historical Provider, MD  Coenzyme Q10 (CO Q10 PO) Take 500 mg by mouth daily.   Yes Historical Provider, MD  cycloSPORINE modified (NEORAL) 25 MG capsule Take  75 mg by mouth 2 (two) times daily.    Yes Historical Provider, MD  fexofenadine (ALLEGRA) 180 MG tablet Take 180 mg by mouth daily.   Yes Historical Provider, MD  fluticasone furoate-vilanterol (BREO ELLIPTA) 100-25 MCG/INH AEPB Inhale 1 puff into the lungs daily. 11/12/15  Yes Rigoberto Noel, MD  furosemide (LASIX) 20 MG tablet Take 40 mg by mouth  daily as needed for fluid or edema.    Yes Historical Provider, MD  losartan (COZAAR) 50 MG tablet Take 50 mg by mouth daily with supper. 12/02/15  Yes Historical Provider, MD  magnesium oxide (MAG-OX) 400 MG tablet Take 400 mg by mouth 5 (five) times daily.    Yes Historical Provider, MD  Multiple Vitamin (MULTIVITAMIN) tablet Take 1 tablet by mouth daily.   Yes Historical Provider, MD  mycophenolate (CELLCEPT) 500 MG tablet Take 1,500 mg by mouth 2 (two) times daily.   Yes Historical Provider, MD  Omega-3 Fatty Acids (FISH OIL) 1000 MG CAPS Take 1 capsule by mouth every evening.   Yes Historical Provider, MD  omeprazole (PRILOSEC) 40 MG capsule Take 40 mg by mouth daily.   Yes Historical Provider, MD  simvastatin (ZOCOR) 40 MG tablet Take 40 mg by mouth daily.   Yes Historical Provider, MD  temazepam (RESTORIL) 15 MG capsule Take 30 mg by mouth at bedtime as needed. For sleep   Yes Historical Provider, MD  tiotropium (SPIRIVA HANDIHALER) 18 MCG inhalation capsule INHALE CONTENTS OF 1 CAPSULE VIA HANDIHALER ONCE DAILY AS DIRECTED 07/16/15  Yes Rigoberto Noel, MD   Allergies  Allergen Reactions  . Ambrisentan Other (See Comments), Itching and Swelling  . Heparin     Made white blood count go down. HIT    FAMILY HISTORY:  family history includes Cancer in his paternal grandfather; Heart attack in his father; Heart disease in his mother; Rheum arthritis in his mother and paternal grandfather. SOCIAL HISTORY:  reports that he quit smoking about 21 years ago. His smoking use included Cigarettes. He has a 30 pack-year smoking history. He has never used smokeless tobacco. He reports that he drinks alcohol. He reports that he does not use illicit drugs.  REVIEW OF SYSTEMS:   Constitutional: Negative for fever, chills, weight loss, malaise/fatigue and diaphoresis.  HENT: Negative for hearing loss, ear pain, nosebleeds, congestion, sore throat & painful swallowing neck pain, tinnitus and ear discharge.     Eyes: Negative for blurred vision, double vision, photophobia, pain, discharge and redness.  Respiratory: Negative for cough, hemoptysis, sputum production, shortness of breath, wheezing and stridor.   Cardiovascular:  chest pain, palpitations, orthopnea, claudication, leg swelling and PND.  Gastrointestinal: Negative for heartburn, nausea, vomiting, abdominal pain, diarrhea, constipation, blood in stool and melena.  Genitourinary: Negative for dysuria, urgency, frequency, hematuria and flank pain.  Musculoskeletal: Negative for myalgias, back pain, joint pain and falls.  Skin: Negative for itching and rash.  Neurological: Negative for dizziness, tingling, tremors, sensory change, speech change, focal weakness, seizures, loss of consciousness, weakness and headaches.  Endo/Heme/Allergies: Negative for environmental allergies and polydipsia. Does not bruise/bleed easily.  SUBJECTIVE:  No distress. Feels a little better VITAL SIGNS: Temp:  [98.4 F (36.9 C)-99.4 F (37.4 C)] 98.4 F (36.9 C) (06/05 0755) Pulse Rate:  [68-88] 71 (06/05 0800) Resp:  [15-33] 33 (06/05 0800) BP: (82-180)/(50-96) 178/91 mmHg (06/05 0800) SpO2:  [87 %-99 %] 97 % (06/05 0810)  PHYSICAL EXAMINATION: General:  Chronically ill appearing male, Resting in bed. No distress  Neuro:  Awake, alert, no focal def  HEENT:  NCAT, MMM, no JVD Cardiovascular:  Rrr, no MRG  Lungs:  Crackles posteriorly. No accessory use.  Abdomen:  Soft, not tender, no OM  Musculoskeletal:  Brisk CR, no edema  Skin:  Brisk CR, no JVD    Recent Labs Lab 12/14/15 0338 12/15/15 0327 12/16/15 0353  NA 142 141 142  K 4.2 4.4 3.7  CL 112* 111 108  CO2 _0 BUN 28* 30* 31*  CREATININE 1.67* 2.15* 2.11*  GLUCOSE 88 89 94    Recent Labs Lab 12/14/15 0338 12/15/15 0327 12/16/15 0353  HGB 10.9* 11.5* 11.9*  HCT 35.8* 37.7* 37.5*  WBC 4.9 5.2 5.0  PLT 135* 139* 144*   Dg Chest Port 1 View  12/16/2015  CLINICAL DATA:   Medical history significant for heart transplantation in 1997. EXAM: PORTABLE CHEST 1 VIEW COMPARISON:  12/15/2015 FINDINGS: Mild cardiac enlargement. Aortic atherosclerosis. Previous median sternotomy. Bilateral pleural effusions are identified. There is mild interstitial edema. No airspace consolidation. IMPRESSION: 1. No change in CHF pattern. Electronically Signed   By: Kerby Moors M.D.   On: 12/16/2015 09:21   Dg Chest Port 1 View  12/15/2015  CLINICAL DATA:  Pneumonia.  Worsening congestion.  Cough EXAM: PORTABLE CHEST 1 VIEW COMPARISON:  12/13/2015 FINDINGS: There is a normal heart size aortic atherosclerosis identified. Bilateral pleural effusions and pulmonary edema is identified compatible with CHF. Diminished aeration to the lung base is noted, left greater than right. IMPRESSION: 1. Worsening CHF pattern. Electronically Signed   By: Kerby Moors M.D.   On: 12/15/2015 09:21   Dg Esophagus W/water Sol Cm  12/14/2015  CLINICAL DATA:  75 year old male with a history of dysphagia/ odynophagia, nausea/vomiting. Patient has a recent laryngoscopy, with erythema/irritation at the arytenoid cartilage. Performed 12/13/2015. EXAM: ESOPHOGRAM/BARIUM SWALLOW TECHNIQUE: Single contrast examination was performed using both thin barium and water-soluble contrast. FLUOROSCOPY TIME:  Fluoroscopy Time:  2 minutes, 18 seconds COMPARISON:  None. FINDINGS: Limited esophagram for purposes of evaluation of odynophagia. Rapid sequence evaluation of swallowing of the contrast bolus performed in AP position with water soluble and oblique position with thin barium. No aspiration identified. Contrast bolus traverses the pharynx unremarkably. Oblique image limited for evaluation of the posterior contour of the hypopharynx/ upper esophagus, however, no large Zenker's diverticulum identified. Series 4 demonstrates questionable ulceration along the anterior lateral of the middle third of the esophagus at the level of the left  mainstem bronchus. Nonspecific dysmotility. Small hiatal hernia. IMPRESSION: Pharyngeal phase of the contrast bolus unremarkable, with no large Zenker's diverticulum identified. Questionable ulceration in the middle third of the esophagus at the level of the left mainstem bronchus (series 4). Correlation with upper endoscopy may be useful. Nonspecific esophageal dysmotility. Small hiatal hernia. Signed, Dulcy Fanny. Earleen Newport, DO [facility] Electronically Signed   By: Corrie Mckusick D.O.   On: 12/14/2015 12:10    ASSESSMENT / PLAN:  Acute on chronic Respiratory Failure Mod-severe COPD Aspiration  Pseudomonas PNA  Severe reflux/regurgitation  Candida Esophagitis w/ probable ulceration Immuno-suppression Pulmonary Hypertension  Prior cardiac transplant 1996 ([facility])  ICM w/ EF 50% 2014 Stage III CKD   Acute on Chronic respiratory failure. Multifactorial in setting of Acute on chronic heart failure, super-imposed on Pseudomonas PNA (in an immunosuppressed host) and underlying mod-severe COPD and PAH. Suspect that the Candida esophagitis and Reflux and probably intermittent associated aspiration also contributing factor.  ->he feels a  little better in c/w 6/4; agree w/ the excellent care that Dr Grandville Silos has been providing w/ little more to offer   Plan/rec Cont cefepime for Pan-sensitive pseudomonas. According to sensitivities he could be d/c'd on Cipro and complete 14d course  Agree w/ diuresis: he minimal Effusion by bedside US so little utility in thoracentesis (and risk would out-weigh benefit) Wean O2 Treat Candida esophagitis Reflux precautions Aspiration precautions.  Cont BDs No need for steroids   Erick Colace ACNP-BC [place] Pager # [phone] OR # [phone] if no answer   12/16/2015, 9:46 AM  75 yo male with hx of heart transplant in 1990's, COPD/emphysema, and pulmonary hypertension with chronic  hypoxic respiratory failure presented with hiccoughs and ulcerative esophagitis.  He had cough with sputum.  He had sputum culture positive for Pseudomonas and started on Abx >> has been seen by ID.  He also had more swelling and CXR showed pulmonary edema.  His breathing has improved since admission.  He still has cough, but not as much sputum.  His hiccoughs have decreased in frequency also.  He denies chest pain.  Thin.  Alert.  No stridor.  No wheeze, decreased BS.  HR regular.  Abd soft.  No edema.  CXR - CHF pattern  Creatinine 2.11, Hb 11.9, WBC 5.  Assessment/plan:  Acute on chronic hypoxic respiratory failure. HCAP with Pseudomonas in sputum. Acute on chronic diastolic CHF. COPD/emphysema. Pulmonary hypertension. - continue Abx per ID and primary team - continue diuresis to keep negative fluid balance - f/u Echo from 6/05 - continue Breo, prn nebs - oxygen to keep SpO2 90 to 95% - continue tadalafil  Hiccoughs >> improving. - monitor clinically  Candidal esophagitis. Reflux. - diflucan per ID and primary team - will d/c lovaza since this could be contributing to his reflux  Hx of cardiac transplant. - continue cyclosporine, cellcept   Chesley Mires, MD [place] 12/16/2015, 10:38 AM Pager:  [phone] After 3pm call: [phone]

## 2015-12-16 NOTE — Consult Note (Signed)
Patient ID: Luke Jacobson. MRN: [id number], DOB/AGE: 11-29-1940   Admit date: 12/13/2015   Reason for Consult: Acute on Chronic Diastolic CHF Requesting MD: Dr. Grandville Silos, Internal Medicine.    Primary Physician: Gara Kroner, MD Primary Cardiologist: [facility]   Pt. Profile:  75 y/o male with h/o heart transplant, followed at [facility], CAD with stenting of his RCA and LCx in 2016, chronic respiratory failure, COPD on chronic home O2 at 6-7 L/min [place], AAA, HTN, HLD and chronic diastolic HF. He was recently hospitalized from 5/25-5/28 for acute ulcerative supraglottitis and was discharged home on oral antibiotics. Readmitted 12/13/15 with acute on chronic respiratory failure, felt be be multifactorial, secondary to health care associated pneumonia secondary to Pseudomonas versus aspiration pneumonia and acute on chronic diastolic CHF. IM is treating his PNA with antibiotics. Cardiology has been consulted to manage his CHF.     Problem List  Past Medical History  Diagnosis Date  . Hypertension   . Shortness of breath   . Hypercholesteremia   . HIT (heparin-induced thrombocytopenia) ([place])   . COPD (chronic obstructive pulmonary disease) ([place])   . AAA (abdominal aortic aneurysm) ([place])   . Arrhythmia has had episodes of fast heart rate    Past Surgical History  Procedure Laterality Date  . Heart transplant    . Heart stents  Sept 2016    2 stents     Allergies  Allergies  Allergen Reactions  . Ambrisentan Other (See Comments), Itching and Swelling  . Heparin     Made white blood count go down. HIT    HPI  The patient is a 75 y/o male, s/p heart transplant in 1997, followed at [facility], also with CAD s/p stenting to his RCA and LCx in 2016, chronic respiratory failure, COPD on chronic home O2 at 6-7 L/min [place], AAA, HTN, HLD and chronic diastolic HF. He was recently hospitalized from 5/25-5/28 for acute ulcerative supraglottitis and was discharged home on oral  antibiotics.  He presented back to the ED, by EMS, on 12/13/15 with increased difficulty swallowing, with associated nausea, vomiting and low grade fever. He did not notice any increased difficulty breathing. However, on arrival, he was hypoxic with O2 sats in the 60s on RA. He was placed on a nonrebreather and was admitted by internal medicine for acute on chronic respiratory failure, felt be be multifactorial, secondary to health care associated pneumonia secondary to Pseudomonas versus aspiration pneumonia and acute on chronic diastolic CHF. IM is treating his PNA with antibiotics. Cardiology has been consulted to manage his CHF.   BNP on admit was mildly abnormal at 248. This is downtrending, now at 144.8.  2D echo is pending. He is on IV lasix, 60 mg IV BID. Good diuresis/ UOP. - 2L out in past 24 hrs. He has had a bump in SCr from 1.31 day of admit to 1.67>>2.15>>2.11. He is on 6 L [place], which is his home baseline. No increased work of breathing. No CP.     Home Medications  Prior to Admission medications   Medication Sig Start Date End Date Taking? Authorizing Provider  acetaminophen (TYLENOL) 500 MG tablet Take 500-1,000 mg by mouth daily as needed for fever.    Yes Historical Provider, MD  ADCIRCA 20 MG TABS Take 40 mg by mouth daily.    Yes Historical Provider, MD  albuterol (PROAIR HFA) 108 (90 Base) MCG/ACT inhaler Inhale 2 puffs into the lungs every 6 (six) hours as needed. 09/18/15  Yes Rigoberto Noel, MD  amoxicillin-clavulanate (AUGMENTIN) 875-125 MG tablet Take 1 tablet by mouth every 12 (twelve) hours. 12/08/15  Yes Thurnell Lose, MD  aspirin EC 81 MG tablet Take 81 mg by mouth at bedtime.    Yes Historical Provider, MD  clopidogrel (PLAVIX) 75 MG tablet Take 75 mg by mouth daily. 10/17/15  Yes Historical Provider, MD  Coenzyme Q10 (CO Q10 PO) Take 500 mg by mouth daily.   Yes Historical Provider, MD  cycloSPORINE modified (NEORAL) 25 MG capsule Take 75 mg by mouth 2 (two) times daily.     Yes Historical Provider, MD  fexofenadine (ALLEGRA) 180 MG tablet Take 180 mg by mouth daily.   Yes Historical Provider, MD  fluticasone furoate-vilanterol (BREO ELLIPTA) 100-25 MCG/INH AEPB Inhale 1 puff into the lungs daily. 11/12/15  Yes Rigoberto Noel, MD  furosemide (LASIX) 20 MG tablet Take 40 mg by mouth daily as needed for fluid or edema.    Yes Historical Provider, MD  losartan (COZAAR) 50 MG tablet Take 50 mg by mouth daily with supper. 12/02/15  Yes Historical Provider, MD  magnesium oxide (MAG-OX) 400 MG tablet Take 400 mg by mouth 5 (five) times daily.    Yes Historical Provider, MD  Multiple Vitamin (MULTIVITAMIN) tablet Take 1 tablet by mouth daily.   Yes Historical Provider, MD  mycophenolate (CELLCEPT) 500 MG tablet Take 1,500 mg by mouth 2 (two) times daily.   Yes Historical Provider, MD  Omega-3 Fatty Acids (FISH OIL) 1000 MG CAPS Take 1 capsule by mouth every evening.   Yes Historical Provider, MD  omeprazole (PRILOSEC) 40 MG capsule Take 40 mg by mouth daily.   Yes Historical Provider, MD  simvastatin (ZOCOR) 40 MG tablet Take 40 mg by mouth daily.   Yes Historical Provider, MD  temazepam (RESTORIL) 15 MG capsule Take 30 mg by mouth at bedtime as needed. For sleep   Yes Historical Provider, MD  tiotropium (SPIRIVA HANDIHALER) 18 MCG inhalation capsule INHALE CONTENTS OF 1 CAPSULE VIA HANDIHALER ONCE DAILY AS DIRECTED 07/16/15  Yes Rigoberto Noel, MD   Hospital Meds.  Marland Kitchen antiseptic oral rinse  7 mL Mouth Rinse BID  . aspirin EC  81 mg Oral QHS  . ceFEPime (MAXIPIME) IV  2 g Intravenous Q24H  . clopidogrel  75 mg Oral Daily  . cycloSPORINE modified  75 mg Oral BID  . feeding supplement (ENSURE ENLIVE)  237 mL Oral BID BM  . fluconazole (DIFLUCAN) IV  200 mg Intravenous Q24H  . fluticasone furoate-vilanterol  1 puff Inhalation Daily  . furosemide  60 mg Intravenous Q12H  . guaiFENesin  1,200 mg Oral BID  . loratadine  10 mg Oral Daily  . magnesium oxide  400 mg Oral 5 X Daily    . mycophenolate  1,500 mg Oral BID  . pantoprazole  40 mg Oral BID  . simvastatin  40 mg Oral Daily  . sodium chloride flush  3 mL Intravenous Q12H  . Tadalafil (PAH)  40 mg Oral Daily    Family History  Family History  Problem Relation Age of Onset  . Heart attack Father   . Heart disease Mother   . Rheum arthritis Mother   . Rheum arthritis Paternal Grandfather   . Cancer Paternal Grandfather     Social History  Social History   Social History  . Marital Status: Single    Spouse Name: N/A  . Number of Children: N/A  . Years of  Education: N/A   Occupational History  . 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 Smoking status: Former Smoker -- 1.00 packs/day for 30 years    Types: Cigarettes    Quit date: 03/10/1994  . Smokeless tobacco: Never Used  . Alcohol Use: Yes     Comment: social  . Drug Use: No  . Sexual Activity: Not on file   Other Topics Concern  . Not on file   Social History Narrative     Review of Systems General:  No chills, fever, night sweats or weight changes.  Cardiovascular:  No chest pain, dyspnea on exertion, edema, orthopnea, palpitations, paroxysmal nocturnal dyspnea. Dermatological: No rash, lesions/masses Respiratory: No cough, dyspnea Urologic: No hematuria, dysuria Abdominal:   No nausea, vomiting, diarrhea, bright red blood per rectum, melena, or hematemesis Neurologic:  No visual changes, wkns, changes in mental status. All other systems reviewed and are otherwise negative except as noted above.  Physical Exam  Blood pressure 106/59, pulse 79, temperature 98.4 F (36.9 C), temperature source Oral, resp. rate 21, height _0  (1.702 m), weight 123 lb 14.4 oz (56.2 kg), SpO2 92 %.  General: Pleasant, NAD, with [place] Psych: Normal affect. Neuro: Alert and oriented X 3. Moves all extremities spontaneously. HEENT: Normal  Neck: Supple without bruits or JVD. Lungs:  Resp regular and  unlabored, using [place], mild bibasilar crackles Heart: RRR no s3, s4, or murmurs. Abdomen: Soft, non-tender, non-distended, BS + x 4.  Extremities: No clubbing, cyanosis or edema. DP/PT/Radials 2+ and equal bilaterally.  Labs  Troponin (Point of Care Test) No results for input(s): TROPIPOC in the last 72 hours.  Recent Labs  12/16/15 0859  TROPONINI 0.03   Lab Results  Component Value Date   WBC 5.0 12/16/2015   HGB 11.9* 12/16/2015   HCT 37.5* 12/16/2015   MCV 92.4 12/16/2015   PLT 144* 12/16/2015    Recent Labs Lab 12/14/15 0338  12/16/15 0353  NA 142  < > 142  K 4.2  < > 3.7  CL 112*  < > 108  CO2 24  < > 26  BUN 28*  < > 31*  CREATININE 1.67*  < > 2.11*  CALCIUM 8.4*  < > 8.6*  PROT 5.6*  --   --   BILITOT 1.2  --   --   ALKPHOS 56  --   --   ALT 14*  --   --   AST 21  --   --   GLUCOSE 88  < > 94  < > = values in this interval not displayed. No results found for: CHOL, HDL, LDLCALC, TRIG No results found for: DDIMER   Radiology/Studies  Dg Chest 2 View  12/13/2015  CLINICAL DATA:  Worsening cough and shortness of breath.  Vomiting. EXAM: CHEST  2 VIEW COMPARISON:  12/05/2015 and 01/17/2015 FINDINGS: Chronic blunting at the right costophrenic angle. Prior median sternotomy. Stable appearance of the heart and mediastinum. Heart size is upper limits of normal. Interstitial prominence from the prior examination is less conspicuous. There continues to be prominent densities at the right lung base which may be chronic. Mild degenerative changes in the thoracic spine. IMPRESSION: Patchy densities at the right lung base are nonspecific. This could represent acute on chronic disease. Difficult to exclude a subtle infectious etiology at this location. Electronically Signed   By: Markus Daft M.D.   On: 12/13/2015 11:09  Ct Soft Tissue Neck W Contrast  12/05/2015  CLINICAL DATA:  Neck pain, headache, and difficulty swallowing for 5 days. Fever. Assess for retropharyngeal  abscess. EXAM: CT NECK WITH CONTRAST TECHNIQUE: Multidetector CT imaging of the neck was performed using the standard protocol following the bolus administration of intravenous contrast. CONTRAST:  75 mL Isovue 300 COMPARISON:  None. FINDINGS: Pharynx and larynx: Examination limited by respiratory motion artifact and metallic dental streak artifact. No tonsillar asymmetry, pharyngeal mass, or retropharyngeal fluid collection identified. Appearance of mild supraglottic/hypopharyngeal soft tissue thickening diffusely with slight thickening of the epiglottis. Airway is widely patent. Salivary glands: Salivary and parotid glands are unremarkable within limitations of artifact. Thyroid: Unremarkable. Lymph nodes: Scattered subcentimeter anterior cervical lymph nodes bilaterally including a 9 mm left level IV lymph node. Vascular: Major vascular structures of the neck appear patent. Mild right and moderate left carotid bifurcation atherosclerosis without evidence of significant stenosis. Limited intracranial: Unremarkable. Visualized orbits: Suspected bilateral cataract extraction, incompletely visualized. Mastoids and visualized paranasal sinuses: Clear. Skeleton: Advanced multilevel disc degeneration in the lower cervical spine. Upper chest: Visualized lung apices are grossly clear with evidence of emphysema. IMPRESSION: 1. Motion degraded examination. No evidence of retropharyngeal abscess. 2. Suggestion of mild supraglottic/hypopharyngeal swelling which may reflect upper respiratory infection/ supraglottitis. 3. Scattered subcentimeter cervical lymph nodes, nonspecific but most likely reactive. Electronically Signed   By: Logan Bores M.D.   On: 12/05/2015 19:11   Dg Chest Port 1 View  12/16/2015  CLINICAL DATA:  Medical history significant for heart transplantation in 1997. EXAM: PORTABLE CHEST 1 VIEW COMPARISON:  12/15/2015 FINDINGS: Mild cardiac enlargement. Aortic atherosclerosis. Previous median sternotomy.  Bilateral pleural effusions are identified. There is mild interstitial edema. No airspace consolidation. IMPRESSION: 1. No change in CHF pattern. Electronically Signed   By: Kerby Moors M.D.   On: 12/16/2015 09:21   Dg Chest Port 1 View  12/15/2015  CLINICAL DATA:  Pneumonia.  Worsening congestion.  Cough EXAM: PORTABLE CHEST 1 VIEW COMPARISON:  12/13/2015 FINDINGS: There is a normal heart size aortic atherosclerosis identified. Bilateral pleural effusions and pulmonary edema is identified compatible with CHF. Diminished aeration to the lung base is noted, left greater than right. IMPRESSION: 1. Worsening CHF pattern. Electronically Signed   By: Kerby Moors M.D.   On: 12/15/2015 09:21   Dg Chest Port 1 View  12/05/2015  CLINICAL DATA:  Neck pain with headache and difficulty swallowing for 5 days. Fever and cough. History of heart transplant. EXAM: PORTABLE CHEST 1 VIEW COMPARISON:  01/17/2015 and 06/13/2013. FINDINGS: 1654 hours. The heart size and mediastinal contours are stable status post median sternotomy. There is aortic atherosclerosis. Compared with the prior studies, there is increased interstitial prominence throughout the lungs. Blunting of both costophrenic angles is unchanged. There is no confluent airspace opacity or pneumothorax. Fractures of the upper sternotomy wires is unchanged. No acute osseous findings are seen. IMPRESSION: Progressive interstitial prominence in the lungs may reflect edema, atypical infection or developing interstitial lung disease. Short term radiographic follow up recommended. If persistent, high-resolution chest CT may be helpful. Electronically Signed   By: Richardean Sale M.D.   On: 12/05/2015 17:11   Dg Esophagus W/water Sol Cm  12/14/2015  CLINICAL DATA:  75 year old male with a history of dysphagia/ odynophagia, nausea/vomiting. Patient has a recent laryngoscopy, with erythema/irritation at the arytenoid cartilage. Performed 12/13/2015. EXAM:  ESOPHOGRAM/BARIUM SWALLOW TECHNIQUE: Single contrast examination was performed using both thin barium and water-soluble contrast. FLUOROSCOPY TIME:  Fluoroscopy Time:  2 minutes, 18 seconds COMPARISON:  None. FINDINGS: Limited esophagram for purposes of evaluation of odynophagia. Rapid sequence evaluation of swallowing of the contrast bolus performed in AP position with water soluble and oblique position with thin barium. No aspiration identified. Contrast bolus traverses the pharynx unremarkably. Oblique image limited for evaluation of the posterior contour of the hypopharynx/ upper esophagus, however, no large Zenker's diverticulum identified. Series 4 demonstrates questionable ulceration along the anterior lateral of the middle third of the esophagus at the level of the left mainstem bronchus. Nonspecific dysmotility. Small hiatal hernia. IMPRESSION: Pharyngeal phase of the contrast bolus unremarkable, with no large Zenker's diverticulum identified. Questionable ulceration in the middle third of the esophagus at the level of the left mainstem bronchus (series 4). Correlation with upper endoscopy may be useful. Nonspecific esophageal dysmotility. Small hiatal hernia. Signed, Dulcy Fanny. Earleen Newport, DO [facility] Electronically Signed   By: Corrie Mckusick D.O.   On: 12/14/2015 12:10    ECG  NSR 70 bpm    ASSESSMENT AND PLAN  Principal Problem:   Acute on chronic respiratory failure (HCC) Active Problems:   CAD (coronary artery disease)   H/O heart transplant (HCC)   Hypertension   Diastolic CHF, acute on chronic (HCC)   Pulmonary hypertension (HCC)   ILD (interstitial lung disease) (HCC)   Regurgitation   Hiccups   HCAP (healthcare-associated pneumonia)   Aspiration pneumonia (HCC)   CKD (chronic kidney disease), stage III   Odynophagia   Dysphagia   Acute kidney injury superimposed on CKD ([place])   1. Acute on Chronic Respiratory  Failure: multifactorial from PNA, chronic diastolic HF and CODP. On 6 L [place], which is his baseline.   2. Acute on Chronic Diastolic CHF: BNP mildly abnormal at 248 on admit. This is improving, now down to 144. O2 status is at his baseline, on 6 L supplemental 02, [place]. No LEE. He has been receiving IV Lasix, 60 mg BID with good UOP. He takes 40 mg PO at home. Renal function has worsened with bump in SCr from 1.3 to 2.1. He has received his AM dose of Lasix. Consider holding PM dose and reassessing volume and renal function in the AM. Agree with 2D echo. Low sodium diet and strict I/Os.   3. H/o Heart Transplant: transplant in 1997. Followed at [facility].   4. CAD of Transplanted Heart: s/p PCI to the RCA and Lcx, also followed at [facility]. He denies any anginal symptomatology. Continue ASA, Plavix and statin. 2D echo pending.   5. PNA: on IV antibiotics. Further management per IM.   6. Acute on Chronic Kidney Disease: Baseline SCr ~1.3-1.4. Scr increased to 2.1 with diuresis. He does not appear to be excessively volume overloaded. Consider holding PM dose of IV lasix and switching to PO tomorrow. Obtain f/u BMP in the am.  7. COPD: on chronic home 02. Per primary.    Signed, Lyda Jester, PA-C 12/16/2015, 11:08 AM   Patient seen and examined. Agree with assessment and plan.  Luke Jacobson is a 74 year old Caucasian male who underwent heart transplant in 1995 at [facility].  He has been on chronic immunosuppression since that time.  In 2016 he underwent two-vessel percutaneous coronary intervention with stenting of his RCA and left circumflex vessels.  He has been on dual antiplatelet therapy.  He has history of COPD and is on chronic oxygen at home, and has a history of an abdominal aortic aneurysm and hyperlipidemia.  He was recently  found to have an esophageal ulcer and treated for Candida esophagitis.  He's been on IV antibiotic therapy for presumed Pseudomonas infection.  He had been hospitalized recently  and was readmitted with hypoxia.  His BNP was only mildly elevated at 248.  He was felt to have diastolic heart failure.  He has been treated with IV Lasix at 60 mg twice a day with 2 L of diuresis yesterday and 1.15 L so far today.  His baseline creatinine is in the 1.3 range and this has risen to 2.11 with a BUN of 31.  Troponin is borderline normal at 0.03.  This is not felt to be due to an MI.  Most likely may be a consequence of mild diastolic heart failure induced demand ischemia.  He denies any chest pain or anginal symptomatology.  His ECG has shown normal sinus rhythm at 73 with QS complex in V1 and V2 without significant ST-T changes.  Previously, the patient had been on losartan prior to admission, but is not on this presently.  On physical examination now, he does not appear to be volume overloaded and appears euvolemic.  HEENT is unremarkable.  He did not have jugular venous distention.  Reduced breath sounds at its bases without wheezing.  Rhythm was regular with a faint 1/6 systolic murmur.  There was no S3 or S4 gallop.  His abdomen was nontender with basilar positive bowel sounds.  He did not have significant edema.  I would recommend discontinuing IV Lasix and will hold this evening's dose.  Tomorrow I would transition to oral furosemide at 40 mg but this may need to be reduced to 20 mg.  A 2-D echo Doppler study was done today and reveals an ejection fraction at 65-70%.  He has normal wall motion without regional abnormalities.  There is grade 2 diastolic dysfunction and tissue Doppler is compatible with elevated ventricular end-diastolic filling pressure.  There was evidence for moderate aortic insufficiency moderate TR and trivial mitral regurgitation.  Although the report comments on PA pressure significantly increased I will need to verify this with the reader since there may be some discordant data.  We will follow the patient with you.  Luke Sine, MD, [facility] 12/16/2015 2:24 PM

## 2015-12-16 NOTE — Progress Notes (Signed)
Physical Therapy Treatment Patient Details Name: Luke Jacobson. MRN: [id number] DOB: [birth year] Today's Date: 12/16/2015    History of Present Illness 75 y.o. male with medical history significant of heart transplantation in 1997 status post stents to the heart in 2016, chronic respiratory failure with COPD on chronic home O2 at 6-7 L nasal cannula, AAA, hypertension, hyperlipidemia who was recently hospitalized 12/05/2015 - 12/08/2015 for acute ulcerative supraglottitis and discharged on oral antibiotics presented to the ED with 1 day history of hiccups with a sensation of his throat closing up, regurgitation of clear saliva    PT Comments    O2 sats drop with minimal activity-89% on 8L O2 with pt taking a few steps in room. Dyspnea 3/4.   Follow Up Recommendations  Home health PT     Equipment Recommendations   (to be determined)    Recommendations for Other Services       Precautions / Restrictions Precautions Precautions: Fall Precaution Comments: monitor sats, chronic O2 Restrictions Weight Bearing Restrictions: No    Mobility  Bed Mobility Overal bed mobility: Needs Assistance Bed Mobility: Supine to Sit     Supine to sit: Supervision;HOB elevated     General bed mobility comments: for safety, lines  Transfers Overall transfer level: Needs assistance Equipment used: 1 person hand held assist Transfers: Sit to/from [facility] Sit to Stand: Min assist Stand pivot transfers: Min assist       General transfer comment: small amount of assist to stabilize. Stand pivot from bed to recliner with 1 HHA  Ambulation/Gait   Ambulation Distance (Feet): 4 Feet Assistive device: 1 person hand held assist       General Gait Details: pt took a few steps from bed to chair with 1 HHA. O2 sats dropped to 89% on 8L   Stairs            Wheelchair Mobility    Modified Rankin (Stroke Patients Only)       Balance                                     Cognition Arousal/Alertness: Awake/alert Behavior During Therapy: WFL for tasks assessed/performed Overall Cognitive Status: Within Functional Limits for tasks assessed                      Exercises      General Comments        Pertinent Vitals/Pain Pain Assessment: No/denies pain    Home Living                      Prior Function            PT Goals (current goals can now be found in the care plan section) Progress towards PT goals: Progressing toward goals (slowly)    Frequency  Min 3X/week    PT Plan Current plan remains appropriate    Co-evaluation             End of Session Equipment Utilized During Treatment: Oxygen Activity Tolerance: Patient tolerated treatment well Patient left: in chair;with call bell/phone within reach;with chair alarm set     Time: 4081-4481 PT Time Calculation (min) (ACUTE ONLY): 11 min  Charges:  $Gait Training: 8-22 mins                    G Codes:  [place] Anna, MPT Pager: [phone]

## 2015-12-16 NOTE — Progress Notes (Addendum)
PROGRESS NOTE    Luke Oh.  GOT:[id number] DOB: 1940-10-12 DOA: 12/13/2015 PCP: Gara Kroner, MD   Brief Narrative:  Luke Brandow. is a 75 y.o. male with medical history significant of heart transplantation in 1997 status post stents to the heart in 2016, chronic respiratory failure with COPD on chronic home O2 at 6-7 L nasal cannula, AAA, hypertension, hyperlipidemia who was recently hospitalized 12/05/2015 - 12/08/2015 for acute ulcerative supraglottitis and discharged on oral antibiotics presented to the ED with 1 day history of pickups with a sensation of his throat closing up, regurgitation of clear saliva and a feeling like there is a narrowing as he tries to swallow food down his esophagus with associated nausea, multiple episodes of emesis one day prior to admission, chills, low-grade fever with a temp of 100, and diarrhea. Patient denies any chest pain, no change in chronic shortness of breath, no stridor, no abdominal pain, no constipation, no dysuria, no melena, no hematemesis, no hematochezia, no voice changes, no visual changes, no gait abnormalities. Patient was brought to the ED per EMS and per ED note when EMS arrived patient was noted to set at 65% on room air was subsequently placed on a nonrebreather and brought to the emergency room. Patient also with ongoing cough which is unchanged since discharge. Patient denied any lower extremity edema.   Assessment & Plan:   Principal Problem:   Acute on chronic respiratory failure (HCC) Active Problems:   Diastolic CHF, acute on chronic (HCC)   HCAP (healthcare-associated pneumonia)   Aspiration pneumonia (HCC)   CAD (coronary artery disease)   H/O heart transplant ([place])   Hypertension   Pulmonary hypertension (HCC)   ILD (interstitial lung disease) (HCC)   Regurgitation   Hiccups   CKD (chronic kidney disease), stage III   Odynophagia   Dysphagia   Acute kidney injury superimposed on CKD ([place])  #1 acute on  chronic respiratory failure Likely multifactorial secondary to acute on chronic diastolic heart failure, healthcare associated pneumonia secondary to Pseudomonas versus aspiration pneumonia. Patient with history of cardiac transplant. Patient currently with increased O2 requirements on 10 L high flow 2. Patient usually on 6-7 L nasal cannula on exertion at home. Sputum Gram stain and culture from 12/06/2015 during last hospitalization showed moderate pseudomonas aeruginosa. Urine Legionella antigen negative. Urine pneumococcus antigen negative. Continue empiric IV Zosyn. Patient also noted to be volume overloaded on lung examination with increased O2 requirements. Patient started on IV Lasix yesterday with good urine output of 2 L. Will cycle cardiac enzymes every 6 hours 3. Check a BNP. Check a 2-D echo. Increase Lasix to 60 mg IV every 12 hours. Consult with cardiology for further evaluation and management. Consult with pulmonary for further evaluation and management.  #2 acute on chronic diastolic heart failure Patient status post cardiac transplant. Patient admitted with probable pneumonia and regurgitation. Patient on empiric IV antibiotics. Patient noted to have increased O2 requirements on 12/15/2015. Chest x-ray done consistent with CHF. Patient placed on IV Lasix with 2 L urine output. Patient however still with increased O2 requirements. We'll cycle cardiac enzymes every 6 hours 3. Check a BNP. Check a 2-D echo. Increase Lasix to 60 mg IV every 12 hours. Consult with cardiology for further evaluation and management. Follow.  #3 regurgitation/dysphagia/odynophagia/Hiccups Questionable etiology. Concern for an GI etiology. Patient recently hospitalized and treated for acute ulcerative supraglottitis and as such there was concern and ENT consulted. Patient underwent laryngoscope on 12/13/2015 which was  unremarkable. Laryngoscope showed clinical improvement from admission. Patient with no stridor. No  change in voice. Patient was also seen in consultation by gastroenterology. Patient underwent a water-soluble esophagram 12/14/2015 which showed ulceration in the middle third of the esophagus at the level of the left mainstem bronchus, nonspecific esophageal dysmotility, small hiatal hernia. Patient has been placed on IV Diflucan and Protonix increased to twice a day per GI recommendations. GI recommended conservative approach and slowly advancing diet. Continue empiric IV antibiotics for pneumonia. Continue supportive care. Advance diet to soft diet today and monitor. Follow.  #4 healthcare associated pneumonia likely secondary to Pseudomonas  versus aspiration pneumonia Per chest x-ray. Patient is afebrile. Patient with increased O2 requirements per RT. Patient with some coarse breath sounds and some crackles in the right base. Patient on immunosuppressive medications secondary to cardiac transplant. Sputum Gram stain and culture from 12/06/2015 was consistent with moderate pseudomonas aeruginosa. Blood cultures are pending. Urine pneumococcus antigen is negative. Urine Legionella antigen is negative. Continue oxygen, scheduled nebulizers, Mucinex, Claritin. Chest PT. narrow IV antibiotics from IV Zosyn to IV cefepime. On discharge if renal function normalizes will discharge on ciprofloxacin 750 mg twice daily to complete a course of antibiotic treatment. Follow.  #5 coronary artery disease/history of cardiac transplant Stable. Continue home regimen of immunosuppressive medications of cyclosporine, CellCept, Cozaar, aspirin, Plavix.  #6 acute on chronic kidney disease stage III Worsening renal function likely secondary to acute on chronic heart failure. Patien placed on diuretics with a urine output of 2 L over the past 24 hours. Creatinine trending back down and at 2.11 from 2.15 from 1.67 from 1.31.  chest x-ray to obtain consistent with volume overload. IV fluids have been saline locked. Increase  Lasix to 60 mg IV every 12 hours. Continue to hold ARB. Follow with diuresis.  #7 chronic respiratory failure on 6-7 L nasal cannula at home/interstitial lung disease Per RT patient with some increased O2 requirements on high flow O2. Will get a chest x-ray. Continue empiric IV antibiotics, Mucinex. Continue home regimen of nebulizers. Chest PT. See #2.  #8 hiccups Likely secondary to pneumonia with irritation of the phrenic nerve versus esophageal etiology. Patient will esophagram 12/14/2015 which showed no large Zenker's diverticulum identified. Question ulceration in the middle third of the esophagus at the level of the left mainstem bronchus. Nonspecific esophageal dysmotility. Small hiatial hernia. Clinical improvement. Continue empiric IV antibiotics. Thorazine as needed.     DVT prophylaxis: SCDs Code Status: Full Family Communication: Updated patient no family at bedside. Disposition Plan: Remain the step down unit today. Home when medically stable and dysphagia/odynophagia resolved or improved and heart failure resolved with decreased O2 requirements.    Consultants:   ENT: Dr. Wilburn Cornelia 12/13/2015  Gastroenterology: Dr. Paulita Fujita 12/13/2015  Cardiology pending  Procedures:   Chest x-ray 12/13/2015  Water soluble esophagram 12/14/2015  Antimicrobials:   IV Zosyn 12/13/2015>>>12/16/2015  IV cefepime 12/16/2015   Subjective: Patient states feels better than yesterday. Patient requiring increased O2 requirements of 10 L high flow O2 baseline is 67 L nasal cannula. Patient denies any chest pain. Patient states some slight improvement with swallowing and was able to tolerate full liquids. Hiccups improving.  Objective: Filed Vitals:   12/16/15 0400 12/16/15 0600 12/16/15 0708 12/16/15 0755  BP: 149/78 149/85 146/78   Pulse: 68 82 72   Temp: 99 F (37.2 C)   98.4 F (36.9 C)  TempSrc: Oral   Oral  Resp: _0 Height:  Weight:      SpO2: 98% 93% 95%      Intake/Output Summary (Last 24 hours) at 12/16/15 0825 Last data filed at 12/16/15 0600  Gross per 24 hour  Intake   1060 ml  Output   1650 ml  Net   -590 ml   Filed Weights   12/13/15 1625  Weight: 56.2 kg (123 lb 14.4 oz)    Examination:  General exam: Appears calm and comfortable. Getting breathing treatment.On 10 L high flow O2. Respiratory system: Some coarse BS and crackles bibasilar right greater than left. Respiratory effort normal. Cardiovascular system: S1 & S2 heard, RRR. No JVD, murmurs, rubs, gallops or clicks. No pedal edema. Gastrointestinal system: Abdomen is nondistended, soft and nontender. No organomegaly or masses felt. Normal bowel sounds heard. Central nervous system: Alert and oriented. No focal neurological deficits. Extremities: Symmetric 5 x 5 power. Skin: No rashes, lesions or ulcers Psychiatry: Judgement and insight appear normal. Mood & affect appropriate.     Data Reviewed: I have personally reviewed following labs and imaging studies  CBC:  Recent Labs Lab 12/13/15 1032 12/14/15 0338 12/15/15 0327 12/16/15 0353  WBC 5.8 4.9 5.2 5.0  NEUTROABS 4.8  --   --  3.6  HGB 12.9* 10.9* 11.5* 11.9*  HCT 40.6 35.8* 37.7* 37.5*  MCV 93.1 95.0 95.4 92.4  PLT 169 135* 139* 220*   Basic Metabolic Panel:  Recent Labs Lab 12/13/15 1032 12/13/15 1646 12/14/15 0338 12/15/15 0327 12/16/15 0353  NA 142  --  142 141 142  K 4.1  --  4.2 4.4 3.7  CL 110  --  112* 111 108  CO2 26  --  _0 GLUCOSE 111*  --  88 89 94  BUN 31*  --  28* 30* 31*  CREATININE 1.31*  --  1.67* 2.15* 2.11*  CALCIUM 9.0  --  8.4* 8.4* 8.6*  MG  --  2.1 2.0 2.0  --    GFR: Estimated Creatinine Clearance: 24.8 mL/min (by C-G formula based on Cr of 2.11). Liver Function Tests:  Recent Labs Lab 12/13/15 1032 12/14/15 0338  AST 26 21  ALT 19 14*  ALKPHOS 66 56  BILITOT 1.1 1.2  PROT 7.1 5.6*  ALBUMIN 3.2* 2.5*   No results for input(s): LIPASE, AMYLASE in  the last 168 hours. No results for input(s): AMMONIA in the last 168 hours. Coagulation Profile: No results for input(s): INR, PROTIME in the last 168 hours. Cardiac Enzymes: No results for input(s): CKTOTAL, CKMB, CKMBINDEX, TROPONINI in the last 168 hours. BNP (last 3 results) No results for input(s): PROBNP in the last 8760 hours. HbA1C: No results for input(s): HGBA1C in the last 72 hours. CBG:  Recent Labs Lab 12/14/15 0754 12/15/15 0756 12/16/15 0748  GLUCAP 83 80 90   Lipid Profile: No results for input(s): CHOL, HDL, LDLCALC, TRIG, CHOLHDL, LDLDIRECT in the last 72 hours. Thyroid Function Tests: No results for input(s): TSH, T4TOTAL, FREET4, T3FREE, THYROIDAB in the last 72 hours. Anemia Panel: No results for input(s): VITAMINB12, FOLATE, FERRITIN, TIBC, IRON, RETICCTPCT in the last 72 hours. Sepsis Labs: No results for input(s): PROCALCITON, LATICACIDVEN in the last 168 hours.  Recent Results (from the past 240 hour(s))  Rapid strep screen (not at [facility])     Status: None   Collection Time: 12/06/15 11:53 AM  Result Value Ref Range Status   Streptococcus, Group A Screen (Direct) NEGATIVE NEGATIVE Final    Comment: (NOTE) A Rapid  Antigen test may result negative if the antigen level in the sample is below the detection level of this test. The FDA has not cleared this test as a stand-alone test therefore the rapid antigen negative result has reflexed to a Group A Strep culture.   Culture, group A strep     Status: None   Collection Time: 12/06/15 11:53 AM  Result Value Ref Range Status   Specimen Description THROAT  Final   Special Requests NONE Reflexed from [id number]  Final   Culture   Final    NO GROUP A STREP (S.PYOGENES) ISOLATED Performed at [facility]    Report Status 12/09/2015 FINAL  Final  MRSA PCR Screening     Status: None   Collection Time: 12/13/15  4:25 PM  Result Value Ref Range Status   MRSA by PCR NEGATIVE NEGATIVE Final    Comment:         The GeneXpert MRSA Assay (FDA approved for NASAL specimens only), is one component of a comprehensive MRSA colonization surveillance program. It is not intended to diagnose MRSA infection nor to guide or monitor treatment for MRSA infections.   Culture, blood (routine x 2) Call MD if unable to obtain prior to antibiotics being given     Status: None (Preliminary result)   Collection Time: 12/13/15  4:46 PM  Result Value Ref Range Status   Specimen Description BLOOD LEFT ARM  Final   Special Requests BOTTLES DRAWN AEROBIC AND ANAEROBIC 5CC  Final   Culture   Final    NO GROWTH 2 DAYS Performed at [facility]    Report Status PENDING  Incomplete  Culture, blood (routine x 2) Call MD if unable to obtain prior to antibiotics being given     Status: None (Preliminary result)   Collection Time: 12/13/15  4:46 PM  Result Value Ref Range Status   Specimen Description BLOOD LEFT ARM  Final   Special Requests BOTTLES DRAWN AEROBIC AND ANAEROBIC 5CC  Final   Culture   Final    NO GROWTH 2 DAYS Performed at [facility]    Report Status PENDING  Incomplete  Urine culture     Status: None   Collection Time: 12/13/15  7:14 PM  Result Value Ref Range Status   Specimen Description URINE, CLEAN CATCH  Final   Special Requests NONE  Final   Culture NO GROWTH Performed at [facility]   Final   Report Status 12/15/2015 FINAL  Final  Culture, sputum-assessment     Status: None   Collection Time: 12/13/15  9:58 PM  Result Value Ref Range Status   Specimen Description SPUTUM  Final   Special Requests Immunocompromised  Final   Sputum evaluation   Final    MICROSCOPIC FINDINGS SUGGEST THAT THIS SPECIMEN IS NOT REPRESENTATIVE OF LOWER RESPIRATORY SECRETIONS. PLEASE RECOLLECT. CALLED TO M GOIN @ 2237 ON 12/13/15 BY CDAVIS   Report Status 12/13/2015 FINAL  Final         Radiology Studies: Dg Chest Port 1 View  12/15/2015  CLINICAL DATA:  Pneumonia.  Worsening  congestion.  Cough EXAM: PORTABLE CHEST 1 VIEW COMPARISON:  12/13/2015 FINDINGS: There is a normal heart size aortic atherosclerosis identified. Bilateral pleural effusions and pulmonary edema is identified compatible with CHF. Diminished aeration to the lung base is noted, left greater than right. IMPRESSION: 1. Worsening CHF pattern. Electronically Signed   By: Kerby Moors M.D.   On: 12/15/2015 09:21  Dg Esophagus W/water Sol Cm  12/14/2015  CLINICAL DATA:  75 year old male with a history of dysphagia/ odynophagia, nausea/vomiting. Patient has a recent laryngoscopy, with erythema/irritation at the arytenoid cartilage. Performed 12/13/2015. EXAM: ESOPHOGRAM/BARIUM SWALLOW TECHNIQUE: Single contrast examination was performed using both thin barium and water-soluble contrast. FLUOROSCOPY TIME:  Fluoroscopy Time:  2 minutes, 18 seconds COMPARISON:  None. FINDINGS: Limited esophagram for purposes of evaluation of odynophagia. Rapid sequence evaluation of swallowing of the contrast bolus performed in AP position with water soluble and oblique position with thin barium. No aspiration identified. Contrast bolus traverses the pharynx unremarkably. Oblique image limited for evaluation of the posterior contour of the hypopharynx/ upper esophagus, however, no large Zenker's diverticulum identified. Series 4 demonstrates questionable ulceration along the anterior lateral of the middle third of the esophagus at the level of the left mainstem bronchus. Nonspecific dysmotility. Small hiatal hernia. IMPRESSION: Pharyngeal phase of the contrast bolus unremarkable, with no large Zenker's diverticulum identified. Questionable ulceration in the middle third of the esophagus at the level of the left mainstem bronchus (series 4). Correlation with upper endoscopy may be useful. Nonspecific esophageal dysmotility. Small hiatal hernia. Signed, Dulcy Fanny. Earleen Newport, DO [facility]  Electronically Signed   By: Corrie Mckusick D.O.   On: 12/14/2015 12:10        Scheduled Meds: . antiseptic oral rinse  7 mL Mouth Rinse BID  . aspirin EC  81 mg Oral QHS  . budesonide (PULMICORT) nebulizer solution  0.25 mg Nebulization BID  . clopidogrel  75 mg Oral Daily  . cycloSPORINE modified  75 mg Oral BID  . feeding supplement (ENSURE ENLIVE)  237 mL Oral BID BM  . fluconazole (DIFLUCAN) IV  200 mg Intravenous Q24H  . fluticasone furoate-vilanterol  1 puff Inhalation Daily  . furosemide  60 mg Intravenous Q12H  . guaiFENesin  1,200 mg Oral BID  . ipratropium-albuterol  3 mL Nebulization TID  . loratadine  10 mg Oral Daily  . magnesium oxide  400 mg Oral 5 X Daily  . mycophenolate  1,500 mg Oral BID  . omega-3 acid ethyl esters  1 g Oral Daily  . pantoprazole  40 mg Oral BID  . piperacillin-tazobactam (ZOSYN)  IV  3.375 g Intravenous Q8H  . simvastatin  40 mg Oral Daily  . sodium chloride flush  3 mL Intravenous Q12H  . Tadalafil (PAH)  40 mg Oral Daily   Continuous Infusions:     LOS: 3 days    Time spent: 59 mins    THOMPSON,DANIEL, MD [facility] Pager [phone]  If 7PM-7AM, please contact night-coverage [email or web address] Password [email or web address] 12/16/2015, 8:25 AM

## 2015-12-17 ENCOUNTER — Inpatient Hospital Stay (HOSPITAL_COMMUNITY): Payer: Medicare Other

## 2015-12-17 DIAGNOSIS — I10 Essential (primary) hypertension: Secondary | ICD-10-CM

## 2015-12-17 DIAGNOSIS — I25811 Atherosclerosis of native coronary artery of transplanted heart without angina pectoris: Secondary | ICD-10-CM

## 2015-12-17 LAB — BRAIN NATRIURETIC PEPTIDE: B Natriuretic Peptide: 170.9 pg/mL — ABNORMAL HIGH (ref 0.0–100.0)

## 2015-12-17 LAB — CBC WITH DIFFERENTIAL/PLATELET
Basophils Absolute: 0 10*3/uL (ref 0.0–0.1)
Basophils Relative: 0 %
EOS ABS: 0.1 10*3/uL (ref 0.0–0.7)
EOS PCT: 3 %
HCT: 37.7 % — ABNORMAL LOW (ref 39.0–52.0)
HEMOGLOBIN: 11.9 g/dL — AB (ref 13.0–17.0)
LYMPHS ABS: 0.6 10*3/uL — AB (ref 0.7–4.0)
Lymphocytes Relative: 13 %
MCH: 29.5 pg (ref 26.0–34.0)
MCHC: 31.6 g/dL (ref 30.0–36.0)
MCV: 93.5 fL (ref 78.0–100.0)
Monocytes Absolute: 0.8 10*3/uL (ref 0.1–1.0)
Monocytes Relative: 18 %
NEUTROS PCT: 66 %
Neutro Abs: 2.9 10*3/uL (ref 1.7–7.7)
Platelets: 140 10*3/uL — ABNORMAL LOW (ref 150–400)
RBC: 4.03 MIL/uL — ABNORMAL LOW (ref 4.22–5.81)
RDW: 13.9 % (ref 11.5–15.5)
WBC: 4.5 10*3/uL (ref 4.0–10.5)

## 2015-12-17 LAB — BASIC METABOLIC PANEL
Anion gap: 8 (ref 5–15)
BUN: 30 mg/dL — AB (ref 6–20)
CALCIUM: 8.6 mg/dL — AB (ref 8.9–10.3)
CO2: 30 mmol/L (ref 22–32)
CREATININE: 1.92 mg/dL — AB (ref 0.61–1.24)
Chloride: 105 mmol/L (ref 101–111)
GFR calc Af Amer: 38 mL/min — ABNORMAL LOW (ref >60)
GFR, EST NON AFRICAN AMERICAN: 33 mL/min — AB (ref >60)
GLUCOSE: 114 mg/dL — AB (ref 65–99)
Potassium: 3.9 mmol/L (ref 3.5–5.1)
Sodium: 143 mmol/L (ref 135–145)

## 2015-12-17 LAB — GLUCOSE, CAPILLARY: GLUCOSE-CAPILLARY: 124 mg/dL — AB (ref 65–99)

## 2015-12-17 MED ORDER — DEXTROSE 5 % IV SOLN
2.0000 g | Freq: Two times a day (BID) | INTRAVENOUS | Status: DC
  Administered 2015-12-17 – 2015-12-18 (×3): 2 g via INTRAVENOUS
  Filled 2015-12-17 (×3): qty 2

## 2015-12-17 NOTE — Progress Notes (Signed)
Pharmacy Antibiotic Note  Luke Jacobson. is a 75 y.o. male admitted on 12/13/2015 with pneumonia.  He was admitted 5/25-5/28 for acute laryngitis/supraglottitis and started on Unasyn per ID recommendation.  This was changed to Augmentin at discharge and he is on D9 antibiotics.  However, sputum cx grew out Pseudomonas which is not covered by his antibiotic regimen.  He is re-admitted today with worsening symptoms of dysphagia.  Pharmacy has been consulted for Zosyn dosing, then narrowed to Cefepime on 6/5.  Today, 12/17/2015 Day #5 antibiotics Tmax 99.1 WBC remain WNL SCr elevated but improving daily, CrCl 28 ml/min CXR 6/4 shows worsening CHF pattern  Plan:  Increase Cefepime to 2g IV q12h for CrCl ~30 ml/min  Monitor renal function and patient progress  Plan noted to change to Cipro 7101m PO bid for discharge   Height: 5' 7" (170.2 cm) Weight: 128 lb 1.4 oz (58.1 kg) IBW/kg (Calculated) : 66.1  Temp (24hrs), Avg:98.5 F (36.9 C), Min:97.6 F (36.4 C), Max:99.1 F (37.3 C)   Recent Labs Lab 12/13/15 1032 12/14/15 0338 12/15/15 0327 12/16/15 0353 12/17/15 0329  WBC 5.8 4.9 5.2 5.0 4.5  CREATININE 1.31* 1.67* 2.15* 2.11* 1.92*    Estimated Creatinine Clearance: 28.2 mL/min (by C-G formula based on Cr of 1.92).    Allergies  Allergen Reactions  . Ambrisentan Other (See Comments), Itching and Swelling  . Heparin     Made white blood count go down. HIT    Antimicrobials this admission: 5/25 >> Levaquin >> 5/26 5/26 >> Vanc >> 5/26 5/26 >> Unasyn >>5/28 5/28 augmentin (pharyngitis)>>6/2 6/2 Zosyn >> 6/5 6/3 Fluconazole >> (6/9) 6/5 Cefepime >>  Dose adjustments this admission: ---  Microbiology results: 5/26 Sputum: PSA (sens Zosyn, Cefepime, Cipro, Imipenem)  6/2 sputum: not a good sample 6/2 Bcx: ngtd 6/2 UCx: NGF 6/2 MRSA PCR: neg 6/2 strep pneumo/legionella ur ag: neg/neg 6/2 HIV screen: NR  Thank you for allowing pharmacy to be a part of this  patient's care.  EPeggyann Juba PharmD, BCPS Pager: [phone] 12/17/2015 7:02 AM

## 2015-12-17 NOTE — Progress Notes (Signed)
Patient Profile: 75 y/o male with h/o heart transplant, followed at [facility], CAD with stenting of his RCA and LCx in 2016, chronic respiratory failure, COPD on chronic home O2 at 6-7 L/min [place], AAA, HTN, HLD and chronic diastolic HF. He was recently hospitalized from 5/25-5/28 for acute ulcerative supraglottitis and was discharged home on oral antibiotics. Readmitted 12/13/15 with acute on chronic respiratory failure, felt be be multifactorial, secondary to health care associated pneumonia secondary to Pseudomonas versus aspiration pneumonia and acute on chronic diastolic CHF. IM is treating his PNA with antibiotics. Cardiology has been consulted to manage his CHF.   Subjective: His breathing is at his baseline. No increased O2 requirements from baseline. His main complaint is difficultly swallowing/ hiccups. He continues to deny chest pain.   Objective: Vital signs in last 24 hours: Temp:  [97.6 F (36.4 C)-99.1 F (37.3 C)] 98.5 F (36.9 C) (06/06 0737) Pulse Rate:  [64-85] 66 (06/06 0600) Resp:  [13-37] 34 (06/06 0600) BP: (87-177)/(53-88) 153/76 mmHg (06/06 0600) SpO2:  [87 %-100 %] 94 % (06/06 0600) Weight:  [128 lb 1.4 oz (58.1 kg)] 128 lb 1.4 oz (58.1 kg) (06/06 0500) Last BM Date: 12/16/15  Intake/Output from previous day: 06/05 0701 - 06/06 0700 In: 150 [IV Piggyback:150] Out: 1975 [Urine:1975] Intake/Output this shift:    Medications Current Facility-Administered Medications  Medication Dose Route Frequency Provider Last Rate Last Dose  . acetaminophen (TYLENOL) tablet 650 mg  650 mg Oral Q6H PRN Eugenie Filler, MD       Or  . acetaminophen (TYLENOL) suppository 650 mg  650 mg Rectal Q6H PRN Eugenie Filler, MD      . antiseptic oral rinse (CPC / CETYLPYRIDINIUM CHLORIDE 0.05%) solution 7 mL  7 mL Mouth Rinse BID Irine Seal V, MD   7 mL at 12/16/15 2200  . aspirin EC tablet 81 mg  81 mg Oral QHS Eugenie Filler, MD   81 mg at 12/16/15 2115  . ceFEPIme  (MAXIPIME) 2 g in dextrose 5 % 50 mL IVPB  2 g Intravenous Q12H Emiliano Dyer, RPH      . chlorproMAZINE (THORAZINE) 25 mg in sodium chloride 0.9 % 25 mL IVPB  25 mg Intravenous Q6H PRN Eugenie Filler, MD   25 mg at 12/14/15 1221  . clopidogrel (PLAVIX) tablet 75 mg  75 mg Oral Daily Eugenie Filler, MD   75 mg at 12/16/15 0958  . cycloSPORINE modified (NEORAL) capsule 75 mg  75 mg Oral BID Eugenie Filler, MD   75 mg at 12/16/15 2115  . feeding supplement (ENSURE ENLIVE) (ENSURE ENLIVE) liquid 237 mL  237 mL Oral BID BM Eugenie Filler, MD   237 mL at 12/15/15 1511  . fluconazole (DIFLUCAN) IVPB 200 mg  200 mg Intravenous Q24H Eugenie Filler, MD   200 mg at 12/16/15 1144  . fluticasone furoate-vilanterol (BREO ELLIPTA) 100-25 MCG/INH 1 puff  1 puff Inhalation Daily Irine Seal V, MD   1 puff at 12/16/15 0830  . furosemide (LASIX) tablet 40 mg  40 mg Oral Daily Brittainy M Simmons, PA-C      . guaiFENesin [facility]) 12 hr tablet 1,200 mg  1,200 mg Oral BID Eugenie Filler, MD   1,200 mg at 12/16/15 2115  . hydrALAZINE (APRESOLINE) injection 10 mg  10 mg Intravenous Q6H PRN Eugenie Filler, MD      . ipratropium-albuterol (DUONEB) 0.5-2.5 (3) MG/3ML nebulizer solution 3 mL  3 mL Nebulization Q2H PRN Eugenie Filler, MD      . loratadine (CLARITIN) tablet 10 mg  10 mg Oral Daily Eugenie Filler, MD   10 mg at 12/16/15 0957  . magnesium oxide (MAG-OX) tablet 400 mg  400 mg Oral 5 X Daily Eugenie Filler, MD   400 mg at 12/17/15 8676  . mycophenolate (CELLCEPT) capsule 1,500 mg  1,500 mg Oral BID Eugenie Filler, MD   1,500 mg at 12/16/15 2115  . ondansetron (ZOFRAN) tablet 4 mg  4 mg Oral Q6H PRN Eugenie Filler, MD       Or  . ondansetron [facility]) injection 4 mg  4 mg Intravenous Q6H PRN Eugenie Filler, MD   4 mg at 12/13/15 1713  . oxyCODONE (Oxy IR/ROXICODONE) immediate release tablet 5 mg  5 mg Oral Q4H PRN Eugenie Filler, MD      . pantoprazole (PROTONIX) EC  tablet 40 mg  40 mg Oral BID Wilford Corner, MD   40 mg at 12/16/15 2115  . senna-docusate (Senokot-S) tablet 1 tablet  1 tablet Oral QHS PRN Eugenie Filler, MD      . simvastatin (ZOCOR) tablet 40 mg  40 mg Oral Daily Eugenie Filler, MD   40 mg at 12/16/15 0957  . sodium chloride flush (NS) 0.9 % injection 3 mL  3 mL Intravenous Q12H Eugenie Filler, MD   3 mL at 12/16/15 2200  . sodium phosphate (FLEET) 7-19 GM/118ML enema 1 enema  1 enema Rectal Once PRN Eugenie Filler, MD      . sorbitol 70 % solution 30 mL  30 mL Oral Daily PRN Eugenie Filler, MD      . Tadalafil (PAH) TABS 40 mg  40 mg Oral Daily Eugenie Filler, MD   40 mg at 12/16/15 1007  . temazepam (RESTORIL) capsule 30 mg  30 mg Oral QHS PRN Eugenie Filler, MD   30 mg at 12/16/15 2120    PE: General: Pleasant, NAD, with [place] Psych: Normal affect. Neuro: Alert and oriented X 3. Moves all extremities spontaneously. HEENT: Normal Neck: Supple without bruits or JVD. Lungs: Resp regular and unlabored, using [place], mild bibasilar crackles Heart: RRR no s3, s4, or murmurs. Abdomen: Soft, non-tender, non-distended, BS + x 4.  Extremities: No clubbing, cyanosis or edema. DP/PT/Radials 2+ and equal bilaterally.  Lab Results:   Recent Labs  12/15/15 0327 12/16/15 0353 12/17/15 0329  WBC 5.2 5.0 4.5  HGB 11.5* 11.9* 11.9*  HCT 37.7* 37.5* 37.7*  PLT 139* 144* 140*   BMET  Recent Labs  12/15/15 0327 12/16/15 0353 12/17/15 0329  NA 141 142 143  K 4.4 3.7 3.9  CL 111 108 105  CO2 _0 GLUCOSE 89 94 114*  BUN 30* 31* 30*  CREATININE 2.15* 2.11* 1.92*  CALCIUM 8.4* 8.6* 8.6*   BNP (last 3 results)  Recent Labs  12/13/15 1032 12/16/15 0353 12/17/15 0329  BNP 248.8* 144.8* 170.9*    ProBNP (last 3 results) No results for input(s): PROBNP in the last 8760 hours.  Studies/Results: 2D Echo 12/16/15  Study Conclusions  - Left ventricle: The cavity size was normal. There was  severe  concentric hypertrophy. Systolic function was vigorous. The  estimated ejection fraction was in the range of 65% to 70%. Wall  motion was normal; there were no regional wall motion  abnormalities. Features are consistent with a pseudonormal left  ventricular  filling pattern, with concomitant abnormal relaxation  and increased filling pressure (grade 2 diastolic dysfunction).  Doppler parameters are consistent with elevated ventricular  end-diastolic filling pressure. - Aortic valve: Trileaflet; normal thickness leaflets. There was  moderate regurgitation. - Aortic root: The aortic root was normal in size. - Mitral valve: Structurally normal valve. There was trivial  regurgitation. - Left atrium: The atrium was mildly to moderately dilated. - Right atrium: The atrium was mildly dilated. - Tricuspid valve: There was moderate regurgitation. - Pulmonary arteries: The main pulmonary artery was normal-sized.  Systolic pressure was severely increased. - Inferior vena cava: The vessel was normal in size. - Pericardium, extracardiac: There was no pericardial effusion.  Assessment/Plan  Principal Problem:   Acute on chronic respiratory failure (HCC) Active Problems:   CAD (coronary artery disease)   H/O heart transplant (HCC)   Hypertension   Diastolic CHF, acute on chronic (HCC)   Pulmonary hypertension (HCC)   ILD (interstitial lung disease) (HCC)   Regurgitation   Hiccups   HCAP (healthcare-associated pneumonia)   Aspiration pneumonia (HCC)   CKD (chronic kidney disease), stage III   Odynophagia   Dysphagia   Acute kidney injury superimposed on CKD ([place])   1. Acute on Chronic Respiratory Failure: multifactorial from PNA, chronic diastolic HF and CODP. On 6 L [place], which is his baseline.   2. Acute on Chronic Diastolic CHF: 2D echo showed vigorous LVEF of 65-70% and grade 2DD. BNP mildly abnormal at 248 on admit. This improved to 144 yesterday but now slightly  back up to 170. O2 status is at his baseline, on 6 L supplemental 02, [place]. No LEE. He remains euvolemic on exam. Still with good UOP. -1.9L out yesterday. Renal function has improved with SCr down from 2.11>1.92, after backing off of IV lasix. Plan is to give 40 mg PO once today and reassess renal function and volume tomorrow. Can reduce PO dose down to 20 mg if worsening renal function.  Continue low sodium diet and strict I/Os.   3. H/o Heart Transplant: transplant in 1997. Followed at [facility].   4. CAD of Transplanted Heart: s/p PCI to the RCA and Lcx, also followed at [facility]. He denies any anginal symptomatology. Continue ASA, Plavix and statin. 2D echo shows vigorous LVEF and normal wall motion.   5. PNA: on IV antibiotics. Further management per IM.   6. Acute on Chronic Kidney Disease: Baseline SCr ~1.3-1.4. Scr increased to 2.1 with diuresis, but now improving, down to 1.92 today. Now on PO Lasix today. Obtain f/u BMP in the am. Further adjust lasix dose tomorrow if needed.   7. COPD: on chronic home 02. Per primary.     LOS: 4 days    Brittainy M. Ladoris Gene 12/17/2015 8:25 AM   Patient seen and examined. Agree with assessment and plan.  Excellent diuresis yesterday at -1825.  Overall, since admission, the patient is -487.  His breathing is improved today.  He denies chest pain or dyspnea.  Renal function has improved today at 192, down from 2.11.  BNP is 170 and is compatible with diastolic heart failure.  Maintaining sinus rhythm in the 60s.   Troy Sine, MD, [facility] 12/17/2015 4:44 PM

## 2015-12-17 NOTE — Progress Notes (Signed)
PROGRESS NOTE    Luke Jacobson.  QQV:[id number] DOB: [birth year] DOA: 12/13/2015 PCP: Gara Kroner, MD   Brief Narrative:  Luke Jacobson. is a 75 y.o. male with medical history significant of heart transplantation in 1997 status post stents to the heart in 2016, chronic respiratory failure with COPD on chronic home O2 at 6-7 L nasal cannula, AAA, hypertension, hyperlipidemia who was recently hospitalized 12/05/2015 - 12/08/2015 for acute ulcerative supraglottitis and discharged on oral antibiotics presented to the ED with 1 day history of pickups with a sensation of his throat closing up, regurgitation of clear saliva and a feeling like there is a narrowing as he tries to swallow food down his esophagus with associated nausea, multiple episodes of emesis one day prior to admission, chills, low-grade fever with a temp of 100, and diarrhea. Patient denies any chest pain, no change in chronic shortness of breath, no stridor, no abdominal pain, no constipation, no dysuria, no melena, no hematemesis, no hematochezia, no voice changes, no visual changes, no gait abnormalities. Patient was brought to the ED per EMS and per ED note when EMS arrived patient was noted to set at 65% on room air was subsequently placed on a nonrebreather and brought to the emergency room. Patient also with ongoing cough which is unchanged since discharge. Patient denied any lower extremity edema.   Patient was admitted sputum Gram stain and culture from prior hospitalization on 12/06/2015 consistent with Pseudomonas. Patient was placed empirically on IV Zosyn and subsequently narrowed down to IV cefepime. Patient was also noted to have increased O2 requirements felt in part to be secondary to an acute CHF exacerbation in addition to pneumonia and chronic lung disease. Patient was diuresed with some clinical improvement. Cardiology and critical care were also consulted.  Assessment & Plan:   Principal Problem:   Acute on  chronic respiratory failure (HCC) Active Problems:   Diastolic CHF, acute on chronic (HCC)   HCAP (healthcare-associated pneumonia)   Aspiration pneumonia (HCC)   CAD (coronary artery disease)   H/O heart transplant ([place])   Hypertension   Pulmonary hypertension (HCC)   ILD (interstitial lung disease) (HCC)   Regurgitation   Hiccups   CKD (chronic kidney disease), stage III   Odynophagia   Dysphagia   Acute kidney injury superimposed on CKD ([place])  #1 acute on chronic respiratory failure Likely multifactorial secondary to acute on chronic diastolic heart failure, healthcare associated pneumonia secondary to Pseudomonas versus aspiration pneumonia. Patient with history of cardiac transplant. Patient with increased O2 requirements on 10 L high flow oxygen on 12/16/2015. Clinical improvement. Patient usually on 6-7 L nasal cannula on exertion at home. Sputum Gram stain and culture from 12/06/2015 during last hospitalization showed moderate pseudomonas aeruginosa. Urine Legionella antigen negative. Urine pneumococcus antigen negative.  Patient also noted to be volume overloaded on lung examination with increased O2 requirements. O2 requirements have improved and patient currently back to baseline O2 of 67 L nasal cannula. Patient is -1.8-5 L over the past 24 hours. Cardiac enzymes were negative 3. 2-D echo with EF of 65-70% with no wall motion abnormalities, grade 2 diastolic dysfunction, pulmonary artery systolic pressures severely increased. Second dose of IV Lasix was held yesterday per cardiology recommendations. BNP was 144.8 yesterday and trended back up to 170.9 today. Patient has been transitioned to oral Lasix per cardiology recommendations. IV Zosyn has been narrowed to IV cefepime. Appreciate cardiology and pulmonary input and recommendations.   #2 acute on chronic diastolic  heart failure Patient status post cardiac transplant. Patient admitted with probable pneumonia and regurgitation.  Patient on empiric IV antibiotics. Patient noted to have increased O2 requirements on 12/15/2015. Chest x-ray done consistent with CHF. Patient placed on IV Lasix with 2 L urine output. Patient however still with increased O2 requirements yesterday 12/16/2015 and cardiology consulted. Cardiac enzymes negative 3. BNP was 144.8 yesterday and trended back up to 170.9. 2-D echo with EF of 65-70% with no wall motion abnormalities, grade 2 diastolic dysfunction, pulmonary artery systolic pressures severely increased. Patient with clinical improvement in O2 requirements back to baseline. Second dose of IV Lasix held yesterday per cardiology recommendations.  Patient was -1.825 L over the past 24 hours. Patient has been placed back on oral Lasix 40 mg daily. Cardiology following and appreciate input and recommendations.  #3 regurgitation/dysphagia/odynophagia/Hiccups Questionable etiology. Concern for an GI etiology. Patient recently hospitalized and treated for acute ulcerative supraglottitis and as such there was concern and ENT consulted. Patient underwent laryngoscope on 12/13/2015 which was unremarkable. Laryngoscope showed clinical improvement from admission. Patient with no stridor. No change in voice. Patient was also seen in consultation by gastroenterology. Patient underwent a water-soluble esophagram 12/14/2015 which showed ulceration in the middle third of the esophagus at the level of the left mainstem bronchus, nonspecific esophageal dysmotility, small hiatal hernia. Patient has been placed on IV Diflucan and Protonix increased to twice a day per GI recommendations. GI recommended conservative approach and slowly advancing diet. Continue empiric IV antibiotics for pneumonia. Continue supportive care. Advance diet to soft diet today and monitor. Follow.  #4 healthcare associated pneumonia likely secondary to Pseudomonas  versus aspiration pneumonia Per chest x-ray. Patient is afebrile. Patient with  improvement with O2 requirements per RT. Patient with some coarse breath sounds and some crackles in the right base. Patient on immunosuppressive medications secondary to cardiac transplant. Sputum Gram stain and culture from 12/06/2015 was consistent with moderate pseudomonas aeruginosa. Blood cultures are pending. Urine pneumococcus antigen is negative. Urine Legionella antigen is negative. Continue oxygen, scheduled nebulizers, Mucinex, Claritin. Chest PT. Narrowed IV antibiotics from IV Zosyn to IV cefepime on 12/16/2015. On discharge if renal function normalizes will discharge on ciprofloxacin 750 mg twice daily to complete a course of antibiotic treatment. Follow.  #5 coronary artery disease/history of cardiac transplant/pulmonary hypertension Stable. 2-D echo obtained with EF of 65-70% with no wall motion abnormalities. Grade 2 diastolic dysfunction. Pulmonary artery systolic pressures severely increased. Continue home regimen of immunosuppressive medications of cyclosporine, CellCept, Cozaar, aspirin, Plavix, tadalafil.  #6 acute on chronic kidney disease stage III Worsening renal function likely secondary to acute on chronic heart failure. Patient placed on diuretics with a urine output of 1.9 L over the past 24 hours. Creatinine trending back down and at 1.9 to from 2.11 from 2.15 from 1.67 from 1.31.  chest x-ray with improvement with volume overload. Patient received 1 dose of IV Lasix yesterday and second dose was held per cardiology recommendations. Patient now on oral Lasix. Continue to hold ARB. Follow with diuresis.  #7 chronic respiratory failure on 6-7 L nasal cannula at home/interstitial lung disease Per RT patient with some increased O2 requirements on high flow O2 over the past 2 days which have improved. Patient back on baseline oxygen. Chest x-ray this morning with mild cardiomegaly and negative for pulmonary edema. Atelectasis is noted. Continue empiric IV antibiotics, Mucinex.  Continue home regimen of nebulizers. Chest PT. See #2.  #8 hiccups Likely secondary to pneumonia with irritation of  the phrenic nerve versus esophageal etiology. Patient will esophagram 12/14/2015 which showed no large Zenker's diverticulum identified. Question ulceration in the middle third of the esophagus at the level of the left mainstem bronchus. Nonspecific esophageal dysmotility. Small hiatial hernia. Patient was improving however states hiccups have returned. Did not notice any hiccups this morning while assessing the patient. Continue empiric IV antibiotics. Thorazine as needed.     DVT prophylaxis: SCDs Code Status: Full Family Communication: Updated patient no family at bedside. Disposition Plan: Transfer to telemetry. Home when medically stable and dysphagia/odynophagia resolved or improved and heart failure resolved with decreased O2 requirements. Hopefully tomorrow.   Consultants:   ENT: Dr. Wilburn Cornelia 12/13/2015  Gastroenterology: Dr. Paulita Fujita 12/13/2015  Cardiology: Dr Claiborne Billings 12/16/2015  PCCM: Dr Halford Chessman 12/16/2015  Procedures:   Chest x-ray 12/13/2015, 12/15/2015, 12/16/2015, 12/17/2015  Water soluble esophagram 12/14/2015  2-D echo 12/16/2015  Antimicrobials:   IV Zosyn 12/13/2015>>>12/16/2015  IV cefepime 12/16/2015   Subjective: Patient states feels not feeling too well this morning. Patient however states shortness of breath has improved. No chest pain. Patient states legs have not been this small for a while. Patient states tried soft diet last night however was too spicy. Patient also states hiccups have returned.  Objective: Filed Vitals:   12/17/15 0500 12/17/15 0600 12/17/15 0700 12/17/15 0737  BP: 149/72 153/76 164/85   Pulse: 67 66 67   Temp:    98.5 F (36.9 C)  TempSrc:    Oral  Resp: 21 34 27   Height:      Weight: 58.1 kg (128 lb 1.4 oz)     SpO2: 95% 94% 95%     Intake/Output Summary (Last 24 hours) at 12/17/15 1001 Last data filed at  12/17/15 0800  Gross per 24 hour  Intake    150 ml  Output   1925 ml  Net  -1775 ml   Filed Weights   12/13/15 1625 12/17/15 0500  Weight: 56.2 kg (123 lb 14.4 oz) 58.1 kg (128 lb 1.4 oz)    Examination:  General exam: Appears calm and comfortable. Sitting up in chair. On [place] 6-7L Respiratory system: Some coarse BS and crackles bibasilar. Respiratory effort normal. Cardiovascular system: S1 & S2 heard, RRR. No JVD, murmurs, rubs, gallops or clicks. No pedal edema. Gastrointestinal system: Abdomen is nondistended, soft and nontender. No organomegaly or masses felt. Normal bowel sounds heard. Central nervous system: Alert and oriented. No focal neurological deficits. Extremities: Symmetric 5 x 5 power. Skin: No rashes, lesions or ulcers Psychiatry: Judgement and insight appear normal. Mood & affect appropriate.     Data Reviewed: I have personally reviewed following labs and imaging studies  CBC:  Recent Labs Lab 12/13/15 1032 12/14/15 0338 12/15/15 0327 12/16/15 0353 12/17/15 0329  WBC 5.8 4.9 5.2 5.0 4.5  NEUTROABS 4.8  --   --  3.6 2.9  HGB 12.9* 10.9* 11.5* 11.9* 11.9*  HCT 40.6 35.8* 37.7* 37.5* 37.7*  MCV 93.1 95.0 95.4 92.4 93.5  PLT 169 135* 139* 144* 106*   Basic Metabolic Panel:  Recent Labs Lab 12/13/15 1032 12/13/15 1646 12/14/15 0338 12/15/15 0327 12/16/15 0353 12/17/15 0329  NA 142  --  142 141 142 143  K 4.1  --  4.2 4.4 3.7 3.9  CL 110  --  112* 111 108 105  CO2 26  --  _0 GLUCOSE 111*  --  88 89 94 114*  BUN 31*  --  28* 30* 31*  30*  CREATININE 1.31*  --  1.67* 2.15* 2.11* 1.92*  CALCIUM 9.0  --  8.4* 8.4* 8.6* 8.6*  MG  --  2.1 2.0 2.0 1.7  --    GFR: Estimated Creatinine Clearance: 28.2 mL/min (by C-G formula based on Cr of 1.92). Liver Function Tests:  Recent Labs Lab 12/13/15 1032 12/14/15 0338  AST 26 21  ALT 19 14*  ALKPHOS 66 56  BILITOT 1.1 1.2  PROT 7.1 5.6*  ALBUMIN 3.2* 2.5*   No results for input(s):  LIPASE, AMYLASE in the last 168 hours. No results for input(s): AMMONIA in the last 168 hours. Coagulation Profile: No results for input(s): INR, PROTIME in the last 168 hours. Cardiac Enzymes:  Recent Labs Lab 12/16/15 0859 12/16/15 1419 12/16/15 2055  TROPONINI 0.03 0.03 0.03   BNP (last 3 results) No results for input(s): PROBNP in the last 8760 hours. HbA1C: No results for input(s): HGBA1C in the last 72 hours. CBG:  Recent Labs Lab 12/14/15 0754 12/15/15 0756 12/16/15 0748 12/17/15 0726  GLUCAP 83 80 90 124*   Lipid Profile: No results for input(s): CHOL, HDL, LDLCALC, TRIG, CHOLHDL, LDLDIRECT in the last 72 hours. Thyroid Function Tests: No results for input(s): TSH, T4TOTAL, FREET4, T3FREE, THYROIDAB in the last 72 hours. Anemia Panel: No results for input(s): VITAMINB12, FOLATE, FERRITIN, TIBC, IRON, RETICCTPCT in the last 72 hours. Sepsis Labs: No results for input(s): PROCALCITON, LATICACIDVEN in the last 168 hours.  Recent Results (from the past 240 hour(s))  MRSA PCR Screening     Status: None   Collection Time: 12/13/15  4:25 PM  Result Value Ref Range Status   MRSA by PCR NEGATIVE NEGATIVE Final    Comment:        The GeneXpert MRSA Assay (FDA approved for NASAL specimens only), is one component of a comprehensive MRSA colonization surveillance program. It is not intended to diagnose MRSA infection nor to guide or monitor treatment for MRSA infections.   Culture, blood (routine x 2) Call MD if unable to obtain prior to antibiotics being given     Status: None (Preliminary result)   Collection Time: 12/13/15  4:46 PM  Result Value Ref Range Status   Specimen Description BLOOD LEFT ARM  Final   Special Requests BOTTLES DRAWN AEROBIC AND ANAEROBIC 5CC  Final   Culture   Final    NO GROWTH 3 DAYS Performed at [facility]    Report Status PENDING  Incomplete  Culture, blood (routine x 2) Call MD if unable to obtain prior to antibiotics  being given     Status: None (Preliminary result)   Collection Time: 12/13/15  4:46 PM  Result Value Ref Range Status   Specimen Description BLOOD LEFT ARM  Final   Special Requests BOTTLES DRAWN AEROBIC AND ANAEROBIC 5CC  Final   Culture   Final    NO GROWTH 3 DAYS Performed at [facility]    Report Status PENDING  Incomplete  Urine culture     Status: None   Collection Time: 12/13/15  7:14 PM  Result Value Ref Range Status   Specimen Description URINE, CLEAN CATCH  Final   Special Requests NONE  Final   Culture NO GROWTH Performed at [facility]   Final   Report Status 12/15/2015 FINAL  Final  Culture, sputum-assessment     Status: None   Collection Time: 12/13/15  9:58 PM  Result Value Ref Range Status   Specimen Description SPUTUM  Final   Special Requests Immunocompromised  Final   Sputum evaluation   Final    MICROSCOPIC FINDINGS SUGGEST THAT THIS SPECIMEN IS NOT REPRESENTATIVE OF LOWER RESPIRATORY SECRETIONS. PLEASE RECOLLECT. CALLED TO M GOIN @ 2237 ON 12/13/15 BY CDAVIS   Report Status 12/13/2015 FINAL  Final         Radiology Studies: Dg Chest Port 1 View  12/17/2015  CLINICAL DATA:  Shortness of breath EXAM: PORTABLE CHEST 1 VIEW COMPARISON:  Chest radiograph from one day prior. FINDINGS: Stable configuration of median sternotomy wires with discontinuities in the two uppermost sternotomy wires. Stable cardiomediastinal silhouette with mild cardiomegaly. No pneumothorax. Small bilateral pleural effusions appear stable. No overt pulmonary edema. Bilateral lower lobe patchy opacity, left greater than right, unchanged. IMPRESSION: Stable chest radiograph with mild cardiomegaly, no overt pulmonary edema, small bilateral pleural effusions and nonspecific bibasilar lung opacities favor atelectasis. Electronically Signed   By: Ilona Sorrel M.D.   On: 12/17/2015 08:50   Dg Chest Port 1 View  12/16/2015  CLINICAL DATA:  Medical history significant for heart  transplantation in 1997. EXAM: PORTABLE CHEST 1 VIEW COMPARISON:  12/15/2015 FINDINGS: Mild cardiac enlargement. Aortic atherosclerosis. Previous median sternotomy. Bilateral pleural effusions are identified. There is mild interstitial edema. No airspace consolidation. IMPRESSION: 1. No change in CHF pattern. Electronically Signed   By: Kerby Moors M.D.   On: 12/16/2015 09:21        Scheduled Meds: . antiseptic oral rinse  7 mL Mouth Rinse BID  . aspirin EC  81 mg Oral QHS  . ceFEPime (MAXIPIME) IV  2 g Intravenous Q12H  . clopidogrel  75 mg Oral Daily  . cycloSPORINE modified  75 mg Oral BID  . feeding supplement (ENSURE ENLIVE)  237 mL Oral BID BM  . fluconazole (DIFLUCAN) IV  200 mg Intravenous Q24H  . fluticasone furoate-vilanterol  1 puff Inhalation Daily  . furosemide  40 mg Oral Daily  . guaiFENesin  1,200 mg Oral BID  . loratadine  10 mg Oral Daily  . magnesium oxide  400 mg Oral 5 X Daily  . mycophenolate  1,500 mg Oral BID  . pantoprazole  40 mg Oral BID  . simvastatin  40 mg Oral Daily  . sodium chloride flush  3 mL Intravenous Q12H  . Tadalafil (PAH)  40 mg Oral Daily   Continuous Infusions:     LOS: 4 days    Time spent: 59 mins    THOMPSON,DANIEL, MD [facility] Pager [phone]  If 7PM-7AM, please contact night-coverage [email or web address] Password [email or web address] 12/17/2015, 10:01 AM

## 2015-12-17 NOTE — Progress Notes (Signed)
PULMONARY / CRITICAL CARE MEDICINE   Name: Luke Jacobson. MRN: [id number] DOB: [birth year]    ADMISSION DATE:  12/13/2015 CONSULTATION DATE:  12/16/2015  REFERRING MD:  Dr. Grandville Silos  CHIEF COMPLAINT:  Short of breath  SUBJECTIVE:  Breathing okay.  Still has discomfort with swallowing, and trouble tolerating certain foods that trigger reflux.  Also has hiccoughs.  VITAL SIGNS: BP 153/76 mmHg  Pulse 66  Temp(Src) 98.5 F (36.9 C) (Oral)  Resp 34  Ht _0  (1.702 m)  Wt 128 lb 1.4 oz (58.1 kg)  BMI 20.06 kg/m2  SpO2 94%  INTAKE / OUTPUT: I/O last 3 completed shifts: In: 260 [I.V.:10; IV Piggyback:250] Out: 2575 [Urine:2575]  PHYSICAL EXAMINATION: General: thin Neuro:  Alert, normal strength HEENT:  No stridor, no oral exudate Cardiovascular:  Regular, no murmur Lungs:  No wheeze Abdomen:  Soft, non tender Musculoskeletal:  No edema Skin:  No rashes  LABS:  BMET  Recent Labs Lab 12/15/15 0327 12/16/15 0353 12/17/15 0329  NA 141 142 143  K 4.4 3.7 3.9  CL 111 108 105  CO2 _1 BUN 30* 31* 30*  CREATININE 2.15* 2.11* 1.92*  GLUCOSE 89 94 114*    Electrolytes  Recent Labs Lab 12/14/15 0338 12/15/15 0327 12/16/15 0353 12/17/15 0329  CALCIUM 8.4* 8.4* 8.6* 8.6*  MG 2.0 2.0 1.7  --     CBC  Recent Labs Lab 12/15/15 0327 12/16/15 0353 12/17/15 0329  WBC 5.2 5.0 4.5  HGB 11.5* 11.9* 11.9*  HCT 37.7* 37.5* 37.7*  PLT 139* 144* 140*    Coag's No results for input(s): APTT, INR in the last 168 hours.  Sepsis Markers No results for input(s): LATICACIDVEN, PROCALCITON, O2SATVEN in the last 168 hours.  ABG No results for input(s): PHART, PCO2ART, PO2ART in the last 168 hours.  Liver Enzymes  Recent Labs Lab 12/13/15 1032 12/14/15 0338  AST 26 21  ALT 19 14*  ALKPHOS 66 56  BILITOT 1.1 1.2  ALBUMIN 3.2* 2.5*    Cardiac Enzymes  Recent Labs Lab 12/16/15 0859 12/16/15 1419 12/16/15 2055  TROPONINI 0.03 0.03 0.03     Glucose  Recent Labs Lab 12/14/15 0754 12/15/15 0756 12/16/15 0748 12/17/15 0726  GLUCAP 83 80 90 124*    Imaging Dg Chest Port 1 View  12/16/2015  CLINICAL DATA:  Medical history significant for heart transplantation in 1997. EXAM: PORTABLE CHEST 1 VIEW COMPARISON:  12/15/2015 FINDINGS: Mild cardiac enlargement. Aortic atherosclerosis. Previous median sternotomy. Bilateral pleural effusions are identified. There is mild interstitial edema. No airspace consolidation. IMPRESSION: 1. No change in CHF pattern. Electronically Signed   By: Kerby Moors M.D.   On: 12/16/2015 09:21     STUDIES:  05/23/15 PFT >> FEV1 1.39 (45%) , FEV1% 77, TLC 5.12 (80%), DLCO 14% 5/25 CT neck >> mild supraglottic/hypopharyngeal swelling 6/02 Flexible laryngoscopy >> normal study 6/03 Esophagram >> possible ulceration in middle third of esophagus 6/05 Echo >> EF 65 to 10%, grade 2 diastolic CHF, mod TR  CULTURES: 5/26 Sputum >> Pseudomonas aeruginosa 6/02 Blood >>   ANTIBIOTICS: 6/02 Zosyn >> 6/04 6/03 Diflucan >> 6/05 Cefepime >>   SIGNIFICANT EVENTS: 5/25 - 5/28 Admit with CAP, ulcerative supraglottis 6/02 Re-admit with odynophagia, hiccoughs, GI and ENT consulted 6/05 Cardiology consulted  LINES/TUBES:  DISCUSSION: 75 yo male with recent episode of supraglottitis.  He has persistent odynophagia, reflux, and esophageal candidiasis.  He also has acute on chronic hypoxic respiratory failure from HCAP  with Pseudomonas.  He has hx of cardiac transplant, mod/severe COPD, and pulmonary hypertension.  ASSESSMENT / PLAN:  Acute on chronic respiratory failure 2nd to Pseudomonal pneumonia with hx of mod/severe COPD/emphysema. Pulmonary hypertension. - oxygen to keep SpO2 90 to 95% - continue breo with prn nebs - Abx per primary team - continue tadalafil  Severe GERD. Candidal esophagitis. - continue protonix - diet modification - diflucan per primary team  S/p Cardiac transplant 1996  at [facility]. Acute on chronic diastolic CHF. - continue cyclosporine, cellcept - lasix with goal even fluid balance per cardiology and primary team  Hiccoughs. Nausea. - per primary team   He is followed by Dr. Elsworth Soho and at [facility] for pulmonary issues as outpt.  PCCM will sign off.  Please call if further help is needed while he is in hospital.  Chesley Mires, MD [place] 12/17/2015, 8:32 AM Pager:  [phone] After 3pm call: [phone]

## 2015-12-18 DIAGNOSIS — J690 Pneumonitis due to inhalation of food and vomit: Principal | ICD-10-CM

## 2015-12-18 LAB — CULTURE, BLOOD (ROUTINE X 2)
CULTURE: NO GROWTH
Culture: NO GROWTH

## 2015-12-18 LAB — CBC
HCT: 37 % — ABNORMAL LOW (ref 39.0–52.0)
HEMOGLOBIN: 11.7 g/dL — AB (ref 13.0–17.0)
MCH: 29.2 pg (ref 26.0–34.0)
MCHC: 31.6 g/dL (ref 30.0–36.0)
MCV: 92.3 fL (ref 78.0–100.0)
PLATELETS: 118 10*3/uL — AB (ref 150–400)
RBC: 4.01 MIL/uL — AB (ref 4.22–5.81)
RDW: 13.6 % (ref 11.5–15.5)
WBC: 4.3 10*3/uL (ref 4.0–10.5)

## 2015-12-18 LAB — BASIC METABOLIC PANEL
ANION GAP: 7 (ref 5–15)
BUN: 27 mg/dL — AB (ref 6–20)
CHLORIDE: 104 mmol/L (ref 101–111)
CO2: 30 mmol/L (ref 22–32)
Calcium: 8.5 mg/dL — ABNORMAL LOW (ref 8.9–10.3)
Creatinine, Ser: 1.66 mg/dL — ABNORMAL HIGH (ref 0.61–1.24)
GFR, EST AFRICAN AMERICAN: 46 mL/min — AB (ref >60)
GFR, EST NON AFRICAN AMERICAN: 39 mL/min — AB (ref >60)
Glucose, Bld: 91 mg/dL (ref 65–99)
POTASSIUM: 3.9 mmol/L (ref 3.5–5.1)
Sodium: 141 mmol/L (ref 135–145)

## 2015-12-18 LAB — BRAIN NATRIURETIC PEPTIDE: B NATRIURETIC PEPTIDE 5: 229.8 pg/mL — AB (ref 0.0–100.0)

## 2015-12-18 LAB — GLUCOSE, CAPILLARY: GLUCOSE-CAPILLARY: 88 mg/dL (ref 65–99)

## 2015-12-18 MED ORDER — FUROSEMIDE 20 MG PO TABS: 40.0000 mg | ORAL_TABLET | Freq: Every day | ORAL | Status: AC

## 2015-12-18 MED ORDER — ENSURE ENLIVE PO LIQD: 237.0000 mL | Freq: Two times a day (BID) | ORAL | Status: DC

## 2015-12-18 MED ORDER — FLUCONAZOLE 100 MG PO TABS: 100.0000 mg | ORAL_TABLET | Freq: Every day | ORAL | Status: DC

## 2015-12-18 MED ORDER — CIPROFLOXACIN HCL 500 MG PO TABS
500.0000 mg | ORAL_TABLET | Freq: Two times a day (BID) | ORAL | Status: DC
  Administered 2015-12-18: 500 mg via ORAL
  Filled 2015-12-18: qty 1

## 2015-12-18 MED ORDER — FLUCONAZOLE 100 MG PO TABS
100.0000 mg | ORAL_TABLET | Freq: Every day | ORAL | Status: DC
  Administered 2015-12-18: 100 mg via ORAL
  Filled 2015-12-18: qty 1

## 2015-12-18 MED ORDER — CIPROFLOXACIN HCL 750 MG PO TABS: 750.0000 mg | ORAL_TABLET | Freq: Two times a day (BID) | ORAL | Status: DC

## 2015-12-18 MED ORDER — CIPROFLOXACIN HCL 500 MG PO TABS: 500.0000 mg | ORAL_TABLET | Freq: Two times a day (BID) | ORAL | Status: DC

## 2015-12-18 MED ORDER — CIPROFLOXACIN HCL 500 MG PO TABS: 750.0000 mg | ORAL_TABLET | Freq: Two times a day (BID) | ORAL | Status: DC

## 2015-12-18 MED ORDER — GUAIFENESIN ER 600 MG PO TB12: 1200.0000 mg | ORAL_TABLET | Freq: Two times a day (BID) | ORAL | Status: DC

## 2015-12-18 NOTE — Care Management Important Message (Signed)
Important Message  Patient Details Important Message  Patient Details  Name: Luke Jacobson. MRN: [id number] Date of Birth: 1940-12-28   Medicare Important Message Given:  Yes    Camillo Flaming 12/18/2015, 10:20 AMName: [name] MRN: [id number] Date of Birth: [birth year]   Medicare Important Message Given:  Yes    Camillo Flaming 12/18/2015, 10:20 AM

## 2015-12-18 NOTE — Progress Notes (Signed)
Patient Profile: 75 y/o male with h/o heart transplant, followed at [facility], CAD with stenting of his RCA and LCx in 2016, chronic respiratory failure, COPD on chronic home O2 at 6-7 L/min [place], AAA, HTN, HLD and chronic diastolic HF. He was recently hospitalized from 5/25-5/28 for acute ulcerative supraglottitis and was discharged home on oral antibiotics. Readmitted 12/13/15 with acute on chronic respiratory failure, felt be be multifactorial, secondary to health care associated pneumonia secondary to Pseudomonas versus aspiration pneumonia and acute on chronic diastolic CHF. IM is treating his PNA with antibiotics. Cardiology has been consulted to manage his CHF.   Subjective: Just finished walking with PT and he felt weak. Also had to bump is O2 up to 8L.  No CP. Throat discomfort has improved. On a soft diet and tolerating well.   Objective: Vital signs in last 24 hours: Temp:  [98 F (36.7 C)-98.1 F (36.7 C)] 98 F (36.7 C) (06/07 0500) Pulse Rate:  [70-77] 70 (06/07 0837) Resp:  [18-22] 18 (06/07 0837) BP: (105-110)/(59-62) 105/62 mmHg (06/07 0500) SpO2:  [95 %-99 %] 95 % (06/07 0837) Weight:  [124 lb 3.2 oz (56.337 kg)] 124 lb 3.2 oz (56.337 kg) (06/07 0500) Last BM Date: 12/17/15  Intake/Output from previous day: 06/06 0701 - 06/07 0700 In: 225 [IV Piggyback:225] Out: 1475 [Urine:1475] Intake/Output this shift: Total I/O In: 120 [P.O.:120] Out: -   Medications Current Facility-Administered Medications  Medication Dose Route Frequency Provider Last Rate Last Dose  . acetaminophen (TYLENOL) tablet 650 mg  650 mg Oral Q6H PRN Eugenie Filler, MD       Or  . acetaminophen (TYLENOL) suppository 650 mg  650 mg Rectal Q6H PRN Eugenie Filler, MD      . antiseptic oral rinse (CPC / CETYLPYRIDINIUM CHLORIDE 0.05%) solution 7 mL  7 mL Mouth Rinse BID Eugenie Filler, MD   7 mL at 12/18/15 1000  . aspirin EC tablet 81 mg  81 mg Oral QHS Eugenie Filler, MD   81 mg at 12/17/15  2156  . chlorproMAZINE (THORAZINE) 25 mg in sodium chloride 0.9 % 25 mL IVPB  25 mg Intravenous Q6H PRN Eugenie Filler, MD   25 mg at 12/17/15 2230  . ciprofloxacin (CIPRO) tablet 500 mg  500 mg Oral BID Belkys A Regalado, MD      . clopidogrel (PLAVIX) tablet 75 mg  75 mg Oral Daily Eugenie Filler, MD   75 mg at 12/18/15 1012  . cycloSPORINE modified (NEORAL) capsule 75 mg  75 mg Oral BID Eugenie Filler, MD   75 mg at 12/18/15 1013  . feeding supplement (ENSURE ENLIVE) (ENSURE ENLIVE) liquid 237 mL  237 mL Oral BID BM Eugenie Filler, MD   237 mL at 12/17/15 1048  . fluconazole (DIFLUCAN) tablet 100 mg  100 mg Oral Daily Belkys A Regalado, MD      . fluticasone furoate-vilanterol (BREO ELLIPTA) 100-25 MCG/INH 1 puff  1 puff Inhalation Daily Irine Seal V, MD   1 puff at 12/18/15 0837  . furosemide (LASIX) tablet 40 mg  40 mg Oral Daily Brittainy Erie Noe, PA-C   40 mg at 12/18/15 1013  . guaiFENesin (MUCINEX) 12 hr tablet 1,200 mg  1,200 mg Oral BID Irine Seal V, MD   1,200 mg at 12/18/15 1012  . hydrALAZINE (APRESOLINE) injection 10 mg  10 mg Intravenous Q6H PRN Eugenie Filler, MD      . ipratropium-albuterol (DUONEB) 0.5-2.5 (  3) MG/3ML nebulizer solution 3 mL  3 mL Nebulization Q2H PRN Eugenie Filler, MD   3 mL at 12/17/15 2222  . loratadine (CLARITIN) tablet 10 mg  10 mg Oral Daily Eugenie Filler, MD   10 mg at 12/18/15 1013  . magnesium oxide (MAG-OX) tablet 400 mg  400 mg Oral 5 X Daily Irine Seal V, MD   400 mg at 12/18/15 1013  . mycophenolate (CELLCEPT) capsule 1,500 mg  1,500 mg Oral BID Eugenie Filler, MD   1,500 mg at 12/18/15 1013  . ondansetron (ZOFRAN) tablet 4 mg  4 mg Oral Q6H PRN Eugenie Filler, MD       Or  . ondansetron [facility]) injection 4 mg  4 mg Intravenous Q6H PRN Eugenie Filler, MD   4 mg at 12/17/15 0957  . oxyCODONE (Oxy IR/ROXICODONE) immediate release tablet 5 mg  5 mg Oral Q4H PRN Eugenie Filler, MD      . pantoprazole  (PROTONIX) EC tablet 40 mg  40 mg Oral BID Wilford Corner, MD   40 mg at 12/18/15 1013  . senna-docusate (Senokot-S) tablet 1 tablet  1 tablet Oral QHS PRN Eugenie Filler, MD      . simvastatin (ZOCOR) tablet 40 mg  40 mg Oral Daily Eugenie Filler, MD   40 mg at 12/18/15 1013  . sodium chloride flush (NS) 0.9 % injection 3 mL  3 mL Intravenous Q12H Eugenie Filler, MD   3 mL at 12/18/15 1014  . sodium phosphate (FLEET) 7-19 GM/118ML enema 1 enema  1 enema Rectal Once PRN Eugenie Filler, MD      . sorbitol 70 % solution 30 mL  30 mL Oral Daily PRN Eugenie Filler, MD      . Tadalafil (PAH) TABS 40 mg  40 mg Oral Daily Eugenie Filler, MD   40 mg at 12/18/15 1024  . temazepam (RESTORIL) capsule 30 mg  30 mg Oral QHS PRN Eugenie Filler, MD   30 mg at 12/17/15 2155    PE: General: Pleasant, NAD, with [place] Psych: Normal affect. Neuro: Alert and oriented X 3. Moves all extremities spontaneously. HEENT: Normal Neck: Supple without bruits or JVD. Lungs: Resp regular and unlabored, using [place], CTAB Heart: RRR no s3, s4, or murmurs. Abdomen: Soft, non-tender, non-distended, BS + x 4.  Extremities: No clubbing, cyanosis or edema. DP/PT/Radials 2+ and equal bilaterally.  Lab Results:   Recent Labs  12/16/15 0353 12/17/15 0329 12/18/15 0512  WBC 5.0 4.5 4.3  HGB 11.9* 11.9* 11.7*  HCT 37.5* 37.7* 37.0*  PLT 144* 140* 118*   BMET  Recent Labs  12/16/15 0353 12/17/15 0329 12/18/15 0512  NA 142 143 141  K 3.7 3.9 3.9  CL 108 105 104  CO2 _0 GLUCOSE 94 114* 91  BUN 31* 30* 27*  CREATININE 2.11* 1.92* 1.66*  CALCIUM 8.6* 8.6* 8.5*   BNP (last 3 results)  Recent Labs  12/16/15 0353 12/17/15 0329 12/18/15 0512  BNP 144.8* 170.9* 229.8*   Lipid Panel  No results found for: CHOL, TRIG, HDL, CHOLHDL, VLDL, LDLCALC, LDLDIRECT  ProBNP (last 3 results) No results for input(s): PROBNP in the last 8760 hours.  Studies/Results: 2D Echo  12/16/15  Study Conclusions  - Left ventricle: The cavity size was normal. There was severe  concentric hypertrophy. Systolic function was vigorous. The  estimated ejection fraction was in the range of 65% to 70%.  Wall  motion was normal; there were no regional wall motion  abnormalities. Features are consistent with a pseudonormal left  ventricular filling pattern, with concomitant abnormal relaxation  and increased filling pressure (grade 2 diastolic dysfunction).  Doppler parameters are consistent with elevated ventricular  end-diastolic filling pressure. - Aortic valve: Trileaflet; normal thickness leaflets. There was  moderate regurgitation. - Aortic root: The aortic root was normal in size. - Mitral valve: Structurally normal valve. There was trivial  regurgitation. - Left atrium: The atrium was mildly to moderately dilated. - Right atrium: The atrium was mildly dilated. - Tricuspid valve: There was moderate regurgitation. - Pulmonary arteries: The main pulmonary artery was normal-sized.  Systolic pressure was severely increased. - Inferior vena cava: The vessel was normal in size. - Pericardium, extracardiac: There was no pericardial effusion.  Assessment/Plan  Principal Problem:   Acute on chronic respiratory failure (HCC) Active Problems:   CAD (coronary artery disease)   H/O heart transplant (HCC)   Hypertension   Diastolic CHF, acute on chronic (HCC)   Pulmonary hypertension (HCC)   ILD (interstitial lung disease) (HCC)   Regurgitation   Hiccups   HCAP (healthcare-associated pneumonia)   Aspiration pneumonia (HCC)   CKD (chronic kidney disease), stage III   Odynophagia   Dysphagia   Acute kidney injury superimposed on CKD ([place])   1. Acute on Chronic Respiratory Failure: multifactorial from PNA, chronic diastolic HF and CODP. On 6 L [place], which is his baseline.   2. Acute on Chronic Diastolic CHF: 2D echo showed vigorous LVEF of 65-70% and grade  2DD. BNP mildly abnormal at 248 on admit. This improved to 144 yesterday but now slightly back up to 170. O2 status is at his baseline, on 6 L supplemental 02, [place]. No LEE. He remains euvolemic on exam. Still with good UOP. -1.4L out yesterday. I/Os net negative 1.6L. Renal function has improved with SCr down from 2.11>1.92>>1.66, after backing off of IV lasix. He is doing well with PO lasix. Volume and renal function both stable. Continue Lasix, 40 mg daily.Continue low sodium diet and strict I/Os.   3. H/o Heart Transplant: transplant in 1997. Followed at [facility].   4. CAD of Transplanted Heart: s/p PCI to the RCA and Lcx, also followed at [facility]. He denies any anginal symptomatology. Continue ASA, Plavix and statin. 2D echo shows vigorous LVEF and normal wall motion.   5. PNA: on IV antibiotics. Further management per IM.   6. Acute on Chronic Kidney Disease: Baseline SCr ~1.3-1.4. Scr increased to 2.1 with diuresis, but now improving, down to 1.92>>1.66. Now on PO Lasix  and stable.   7. COPD: on chronic home 02. Per primary.     LOS: 5 days    Brittainy M. Ladoris Gene 12/18/2015 1:28 PM    Patient seen and examined. Agree with assessment and plan. Good diuresis yesterday with net -1250 output on lasix 40 mg orally.  Breathing better. No angina or palpitations.  Cr continues to improve now 1.66, but not yet at baseline.   On 8 L O2 saturation decreased with ambulation to 79%. Hyperdynamic LV fxn with EF 65-70% on echo.    Troy Sine, MD, [facility] 12/18/2015 1:49 PM

## 2015-12-18 NOTE — Progress Notes (Addendum)
SATURATION QUALIFICATIONS: (This note is used to comply with regulatory documentation for home oxygen)  Patient Saturations on Room Air at Rest =  81% on RA  Patient Saturations on Room Air while Ambulating =  Patient Saturations on 8 Liters of oxygen while Ambulating = 79%  Please briefly explain why patient needs home oxygen: Patient wears 6 L/M at home.

## 2015-12-18 NOTE — Discharge Summary (Signed)
Physician Discharge Summary  Luke Jacobson. WSF:[id number] DOB: 1940-10-20 DOA: 12/13/2015  PCP: Gara Kroner, MD  Admit date: 12/13/2015 Discharge date: 12/18/2015  Time spent: 35 minutes  Recommendations for Outpatient Follow-up:  Follow up with primary pulmonologist and cardiologist for management of respiratory failure and HF.  Follow up with GI as needed if symptoms doesn't improved.   Discharge Diagnoses:    Acute on chronic respiratory failure (HCC)   CAD (coronary artery disease)   H/O heart transplant ([place])   Hypertension   Diastolic CHF, acute on chronic (HCC)   Pulmonary hypertension (HCC)   ILD (interstitial lung disease) (HCC)   Regurgitation   Hiccups   HCAP (healthcare-associated pneumonia)   Aspiration pneumonia (HCC)   CKD (chronic kidney disease), stage III   Odynophagia   Dysphagia   Acute kidney injury superimposed on CKD [facility])   Discharge Condition: stable.   Diet recommendation: hearth healthy   Filed Weights   12/13/15 1625 12/17/15 0500 12/18/15 0500  Weight: 56.2 kg (123 lb 14.4 oz) 58.1 kg (128 lb 1.4 oz) 56.337 kg (124 lb 3.2 oz)    History of present illness:  Brief Narrative:  Luke Jacobson. is a 75 y.o. male with medical history significant of heart transplantation in 1997 status post stents to the heart in 2016, chronic respiratory failure with COPD on chronic home O2 at 6-7 L nasal cannula, AAA, hypertension, hyperlipidemia who was recently hospitalized 12/05/2015 - 12/08/2015 for acute ulcerative supraglottitis and discharged on oral antibiotics presented to the ED with 1 day history of pickups with a sensation of his throat closing up, regurgitation of clear saliva and a feeling like there is a narrowing as he tries to swallow food down his esophagus with associated nausea, multiple episodes of emesis one day prior to admission, chills, low-grade fever with a temp of 100, and diarrhea. Patient denies any chest pain, no change in chronic  shortness of breath, no stridor, no abdominal pain, no constipation, no dysuria, no melena, no hematemesis, no hematochezia, no voice changes, no visual changes, no gait abnormalities. Patient was brought to the ED per EMS and per ED note when EMS arrived patient was noted to set at 65% on room air was subsequently placed on a nonrebreather and brought to the emergency room. Patient also with ongoing cough which is unchanged since discharge. Patient denied any lower extremity edema.   Patient was admitted sputum Gram stain and culture from prior hospitalization on 12/06/2015 consistent with Pseudomonas. Patient was placed empirically on IV Zosyn and subsequently narrowed down to IV cefepime. Patient was also noted to have increased O2 requirements felt in part to be secondary to an acute CHF exacerbation in addition to pneumonia and chronic lung disease. Patient was diuresed with some clinical improvement. Cardiology and critical care were also consulted.  Hospital Course:  1 acute on chronic respiratory failure Likely multifactorial secondary to acute on chronic diastolic heart failure, healthcare associated pneumonia secondary to Pseudomonas versus aspiration pneumonia. Patient with history of cardiac transplant. Patient with increased O2 requirements on 10 L high flow oxygen on 12/16/2015. Clinical improvement. Patient usually on 6-7 L nasal cannula on exertion at home. Sputum Gram stain and culture from 12/06/2015 during last hospitalization showed moderate pseudomonas aeruginosa. Urine Legionella antigen negative. Urine pneumococcus antigen negative.  Patient also noted to be volume overloaded on lung examination with increased O2 requirements. O2 requirements have improved and patient currently back to baseline O2 of 67 L nasal cannula.  Patient is -1.8-5 L over the past 24 hours. Cardiac enzymes were negative 3. 2-D echo with EF of 65-70% with no wall motion abnormalities, grade 2 diastolic  dysfunction, pulmonary artery systolic pressures severely increased. Second dose of IV Lasix was held yesterday per cardiology recommendations. BNP was 144.8 yesterday and trended back up to 170.9 today. Patient has been transitioned to oral Lasix per cardiology recommendations. IV Zosyn has been narrowed to IV cefepime. Appreciate cardiology and pulmonary input and recommendations.  Patient on high flow oxygen 8 l sat at 95, he desat on ambulation. Discussed with Dr Halford Chessman, arrange home oxymizer and ok for patient to be discharge.   2 acute on chronic diastolic heart failure Patient status post cardiac transplant. Patient admitted with probable pneumonia and regurgitation. Patient on empiric IV antibiotics. Patient noted to have increased O2 requirements on 12/15/2015. Chest x-ray done consistent with CHF. Patient placed on IV Lasix with 2 L urine output. Patient however still with increased O2 requirements yesterday 12/16/2015 and cardiology consulted. Cardiac enzymes negative 3. BNP was 144.8 yesterday and trended back up to 170.9. 2-D echo with EF of 65-70% with no wall motion abnormalities, grade 2 diastolic dysfunction, pulmonary artery systolic pressures severely increased. Patient with clinical improvement in O2 requirements back to baseline. Second dose of IV Lasix held yesterday per cardiology recommendations. Patient was -1.825 L over the past 24 hours. Patient has been placed back on oral Lasix 40 mg daily. Cardiology following and appreciate input and recommendations. Tolerating oral lasix.   3 regurgitation/dysphagia/odynophagia/Hiccups Questionable etiology. Concern for an GI etiology. Patient recently hospitalized and treated for acute ulcerative supraglottitis and as such there was concern and ENT consulted. Patient underwent laryngoscope on 12/13/2015 which was unremarkable. Laryngoscope showed clinical improvement from admission. Patient with no stridor. No change in voice. Patient was also  seen in consultation by gastroenterology. Patient underwent a water-soluble esophagram 12/14/2015 which showed ulceration in the middle third of the esophagus at the level of the left mainstem bronchus, nonspecific esophageal dysmotility, small hiatal hernia. Patient has been placed on IV Diflucan and Protonix increased to twice a day per GI recommendations. GI recommended conservative approach and slowly advancing diet. Continue empiric IV antibiotics for pneumonia. Continue supportive care. Advance diet to soft diet today and monitor. Follow. Discharge on oral diflucan for 5 more days.  4 healthcare associated pneumonia likely secondary to Pseudomonas versus aspiration pneumonia Per chest x-ray. Patient is afebrile. Patient with improvement with O2 requirements per RT. Patient with some coarse breath sounds and some crackles in the right base. Patient on immunosuppressive medications secondary to cardiac transplant. Sputum Gram stain and culture from 12/06/2015 was consistent with moderate pseudomonas aeruginosa. Blood cultures are pending. Urine pneumococcus antigen is negative. Urine Legionella antigen is negative. Continue oxygen, scheduled nebulizers, Mucinex, Claritin. Chest PT. Narrowed IV antibiotics from IV Zosyn to IV cefepime on 12/16/2015 Discharge on ciprofloxacin for 5 more days.   5 coronary artery disease/history of cardiac transplant/pulmonary hypertension Stable. 2-D echo obtained with EF of 65-70% with no wall motion abnormalities. Grade 2 diastolic dysfunction. Pulmonary artery systolic pressures severely increased. Continue home regimen of immunosuppressive medications of cyclosporine, CellCept, Cozaar, aspirin, Plavix, tadalafil.  6 acute on chronic kidney disease stage III Worsening renal function likely secondary to acute on chronic heart failure. Patient placed on diuretics with a urine output of 1.9 L over the past 24 hours. Creatinine trending back down and at 1.9 to from 2.11  from 2.15 from 1.67 from 1.31.  chest x-ray with improvement with volume overload. Patient received 1 dose of IV Lasix yesterday and second dose was held per cardiology recommendations. Patient now on oral Lasix. Continue to hold ARB. Follow with diuresis. Renal function stable.   7 chronic respiratory failure on 6-7 L nasal cannula at home/interstitial lung disease Per RT patient with some increased O2 requirements on high flow O2 over the past 2 days which have improved.   Chest x-ray this morning with mild cardiomegaly and negative for pulmonary edema. Atelectasis is noted. Continue empiric IV antibiotics, Mucinex. Continue home regimen of nebulizers. Chest PT. See #2. Patient on high flow oxygen 8 l sat at 95, he desat on ambulation. Discussed with Dr Halford Chessman, arrange home oxymizer and ok for patient to be discharge.   8 hiccups Likely secondary to pneumonia with irritation of the phrenic nerve versus esophageal etiology. Patient will esophagram 12/14/2015 which showed no large Zenker's diverticulum identified. Question ulceration in the middle third of the esophagus at the level of the left mainstem bronchus. Nonspecific esophageal dysmotility. Small hiatial hernia. Patient was improving however states hiccups have returned. Did not notice any hiccups this morning while assessing the patient. Continue empiric  antibiotics. Thorazine as needed.   Procedures:  none  Consultations:  Cardiology  CCM  Discharge Exam: Filed Vitals:   12/18/15 0500 12/18/15 0837  BP: 105/62   Pulse: 71 70  Temp: 98 F (36.7 C)   Resp: 20 18    General NAD Cardiovascular: S 1, S 2 RRR Respiratory: bilateral fine crackles.   Discharge Instructions   Discharge Instructions    Diet - low sodium heart healthy    Complete by:  As directed      Increase activity slowly    Complete by:  As directed           Discharge Medication List as of 12/18/2015  3:29 PM    START taking these medications    Details  feeding supplement, ENSURE ENLIVE, (ENSURE ENLIVE) LIQD Take 237 mLs by mouth 2 (two) times daily between meals., Starting 12/18/2015, Until Discontinued, Print    fluconazole (DIFLUCAN) 100 MG tablet Take 1 tablet (100 mg total) by mouth daily., Starting 12/18/2015, Until Discontinued, Print    guaiFENesin (MUCINEX) 600 MG 12 hr tablet Take 2 tablets (1,200 mg total) by mouth 2 (two) times daily., Starting 12/18/2015, Until Discontinued, Print      CONTINUE these medications which have CHANGED   Details  ciprofloxacin (CIPRO) 500 MG tablet Take 1 tablet (500 mg total) by mouth 2 (two) times daily., Starting 12/18/2015, Until Discontinued, Print    furosemide (LASIX) 20 MG tablet Take 2 tablets (40 mg total) by mouth daily., Starting 12/18/2015, Until Discontinued, Print      CONTINUE these medications which have NOT CHANGED   Details  acetaminophen (TYLENOL) 500 MG tablet Take 500-1,000 mg by mouth daily as needed for fever. , Until Discontinued, Historical Med    ADCIRCA 20 MG TABS Take 40 mg by mouth daily. , Until Discontinued, Historical Med    albuterol (PROAIR HFA) 108 (90 Base) MCG/ACT inhaler Inhale 2 puffs into the lungs every 6 (six) hours as needed., Starting 09/18/2015, Until Discontinued, Normal    aspirin EC 81 MG tablet Take 81 mg by mouth at bedtime. , Until Discontinued, Historical Med    clopidogrel (PLAVIX) 75 MG tablet Take 75 mg by mouth daily., Starting 10/17/2015, Until Discontinued, Historical Med    Coenzyme Q10 (CO Q10 PO) Take 500  mg by mouth daily., Until Discontinued, Historical Med    cycloSPORINE modified (NEORAL) 25 MG capsule Take 75 mg by mouth 2 (two) times daily. , Until Discontinued, Historical Med    fexofenadine (ALLEGRA) 180 MG tablet Take 180 mg by mouth daily., Until Discontinued, Historical Med    fluticasone furoate-vilanterol (BREO ELLIPTA) 100-25 MCG/INH AEPB Inhale 1 puff into the lungs daily., Starting 11/12/2015, Until Discontinued, Normal     magnesium oxide (MAG-OX) 400 MG tablet Take 400 mg by mouth 5 (five) times daily. , Until Discontinued, Historical Med    Multiple Vitamin (MULTIVITAMIN) tablet Take 1 tablet by mouth daily., Until Discontinued, Historical Med    mycophenolate (CELLCEPT) 500 MG tablet Take 1,500 mg by mouth 2 (two) times daily., Until Discontinued, Historical Med    Omega-3 Fatty Acids (FISH OIL) 1000 MG CAPS Take 1 capsule by mouth every evening., Until Discontinued, Historical Med    omeprazole (PRILOSEC) 40 MG capsule Take 40 mg by mouth daily., Until Discontinued, Historical Med    simvastatin (ZOCOR) 40 MG tablet Take 40 mg by mouth daily., Until Discontinued, Historical Med    temazepam (RESTORIL) 15 MG capsule Take 30 mg by mouth at bedtime as needed. For sleep, Until Discontinued, Historical Med    tiotropium (SPIRIVA HANDIHALER) 18 MCG inhalation capsule INHALE CONTENTS OF 1 CAPSULE VIA HANDIHALER ONCE DAILY AS DIRECTED, Print      STOP taking these medications     amoxicillin-clavulanate (AUGMENTIN) 875-125 MG tablet      losartan (COZAAR) 50 MG tablet        Allergies  Allergen Reactions  . Ambrisentan Other (See Comments), Itching and Swelling  . Heparin     Made white blood count go down. HIT   Follow-up Information    Follow up with [facility].   Why:  HHRN/PT/OT/aide   Contact information:   [address] [place] [address] [phone]       Follow up with [facility].   Why:  home 02;oxymizer   Contact information:   [address] [place] [address] [phone]       Follow up with Rigoberto Noel., MD In 3 days.   Specialty:  Pulmonary Disease   Contact information:   [address] [place] [address] [phone]        The results of significant diagnostics from this hospitalization (including imaging, microbiology, ancillary and laboratory) are listed below for reference.    Significant  Diagnostic Studies: Dg Chest 2 View  12/13/2015  CLINICAL DATA:  Worsening cough and shortness of breath.  Vomiting. EXAM: CHEST  2 VIEW COMPARISON:  12/05/2015 and 01/17/2015 FINDINGS: Chronic blunting at the right costophrenic angle. Prior median sternotomy. Stable appearance of the heart and mediastinum. Heart size is upper limits of normal. Interstitial prominence from the prior examination is less conspicuous. There continues to be prominent densities at the right lung base which may be chronic. Mild degenerative changes in the thoracic spine. IMPRESSION: Patchy densities at the right lung base are nonspecific. This could represent acute on chronic disease. Difficult to exclude a subtle infectious etiology at this location. Electronically Signed   By: Markus Daft M.D.   On: 12/13/2015 11:09   Ct Soft Tissue Neck W Contrast  12/05/2015  CLINICAL DATA:  Neck pain, headache, and difficulty swallowing for 5 days. Fever. Assess for retropharyngeal abscess. EXAM: CT NECK WITH CONTRAST TECHNIQUE: Multidetector CT imaging of the neck was  performed using the standard protocol following the bolus administration of intravenous contrast. CONTRAST:  75 mL Isovue 300 COMPARISON:  None. FINDINGS: Pharynx and larynx: Examination limited by respiratory motion artifact and metallic dental streak artifact. No tonsillar asymmetry, pharyngeal mass, or retropharyngeal fluid collection identified. Appearance of mild supraglottic/hypopharyngeal soft tissue thickening diffusely with slight thickening of the epiglottis. Airway is widely patent. Salivary glands: Salivary and parotid glands are unremarkable within limitations of artifact. Thyroid: Unremarkable. Lymph nodes: Scattered subcentimeter anterior cervical lymph nodes bilaterally including a 9 mm left level IV lymph node. Vascular: Major vascular structures of the neck appear patent. Mild right and moderate left carotid bifurcation atherosclerosis without evidence of  significant stenosis. Limited intracranial: Unremarkable. Visualized orbits: Suspected bilateral cataract extraction, incompletely visualized. Mastoids and visualized paranasal sinuses: Clear. Skeleton: Advanced multilevel disc degeneration in the lower cervical spine. Upper chest: Visualized lung apices are grossly clear with evidence of emphysema. IMPRESSION: 1. Motion degraded examination. No evidence of retropharyngeal abscess. 2. Suggestion of mild supraglottic/hypopharyngeal swelling which may reflect upper respiratory infection/ supraglottitis. 3. Scattered subcentimeter cervical lymph nodes, nonspecific but most likely reactive. Electronically Signed   By: Logan Bores M.D.   On: 12/05/2015 19:11   Dg Chest Port 1 View  12/17/2015  CLINICAL DATA:  Shortness of breath EXAM: PORTABLE CHEST 1 VIEW COMPARISON:  Chest radiograph from one day prior. FINDINGS: Stable configuration of median sternotomy wires with discontinuities in the two uppermost sternotomy wires. Stable cardiomediastinal silhouette with mild cardiomegaly. No pneumothorax. Small bilateral pleural effusions appear stable. No overt pulmonary edema. Bilateral lower lobe patchy opacity, left greater than right, unchanged. IMPRESSION: Stable chest radiograph with mild cardiomegaly, no overt pulmonary edema, small bilateral pleural effusions and nonspecific bibasilar lung opacities favor atelectasis. Electronically Signed   By: Ilona Sorrel M.D.   On: 12/17/2015 08:50   Dg Chest Port 1 View  12/16/2015  CLINICAL DATA:  Medical history significant for heart transplantation in 1997. EXAM: PORTABLE CHEST 1 VIEW COMPARISON:  12/15/2015 FINDINGS: Mild cardiac enlargement. Aortic atherosclerosis. Previous median sternotomy. Bilateral pleural effusions are identified. There is mild interstitial edema. No airspace consolidation. IMPRESSION: 1. No change in CHF pattern. Electronically Signed   By: Kerby Moors M.D.   On: 12/16/2015 09:21   Dg Chest Port  1 View  12/15/2015  CLINICAL DATA:  Pneumonia.  Worsening congestion.  Cough EXAM: PORTABLE CHEST 1 VIEW COMPARISON:  12/13/2015 FINDINGS: There is a normal heart size aortic atherosclerosis identified. Bilateral pleural effusions and pulmonary edema is identified compatible with CHF. Diminished aeration to the lung base is noted, left greater than right. IMPRESSION: 1. Worsening CHF pattern. Electronically Signed   By: Kerby Moors M.D.   On: 12/15/2015 09:21   Dg Chest Port 1 View  12/05/2015  CLINICAL DATA:  Neck pain with headache and difficulty swallowing for 5 days. Fever and cough. History of heart transplant. EXAM: PORTABLE CHEST 1 VIEW COMPARISON:  01/17/2015 and 06/13/2013. FINDINGS: 1654 hours. The heart size and mediastinal contours are stable status post median sternotomy. There is aortic atherosclerosis. Compared with the prior studies, there is increased interstitial prominence throughout the lungs. Blunting of both costophrenic angles is unchanged. There is no confluent airspace opacity or pneumothorax. Fractures of the upper sternotomy wires is unchanged. No acute osseous findings are seen. IMPRESSION: Progressive interstitial prominence in the lungs may reflect edema, atypical infection or developing interstitial lung disease. Short term radiographic follow up recommended. If persistent, high-resolution chest CT may be helpful. Electronically Signed  By: Richardean Sale M.D.   On: 12/05/2015 17:11   Dg Esophagus W/water Sol Cm  12/14/2015  CLINICAL DATA:  75 year old male with a history of dysphagia/ odynophagia, nausea/vomiting. Patient has a recent laryngoscopy, with erythema/irritation at the arytenoid cartilage. Performed 12/13/2015. EXAM: ESOPHOGRAM/BARIUM SWALLOW TECHNIQUE: Single contrast examination was performed using both thin barium and water-soluble contrast. FLUOROSCOPY TIME:  Fluoroscopy Time:  2 minutes, 18 seconds COMPARISON:  None. FINDINGS: Limited esophagram for purposes  of evaluation of odynophagia. Rapid sequence evaluation of swallowing of the contrast bolus performed in AP position with water soluble and oblique position with thin barium. No aspiration identified. Contrast bolus traverses the pharynx unremarkably. Oblique image limited for evaluation of the posterior contour of the hypopharynx/ upper esophagus, however, no large Zenker's diverticulum identified. Series 4 demonstrates questionable ulceration along the anterior lateral of the middle third of the esophagus at the level of the left mainstem bronchus. Nonspecific dysmotility. Small hiatal hernia. IMPRESSION: Pharyngeal phase of the contrast bolus unremarkable, with no large Zenker's diverticulum identified. Questionable ulceration in the middle third of the esophagus at the level of the left mainstem bronchus (series 4). Correlation with upper endoscopy may be useful. Nonspecific esophageal dysmotility. Small hiatal hernia. Signed, Dulcy Fanny. Earleen Newport, DO [facility] Electronically Signed   By: Corrie Mckusick D.O.   On: 12/14/2015 12:10    Microbiology: Recent Results (from the past 240 hour(s))  MRSA PCR Screening     Status: None   Collection Time: 12/13/15  4:25 PM  Result Value Ref Range Status   MRSA by PCR NEGATIVE NEGATIVE Final    Comment:        The GeneXpert MRSA Assay (FDA approved for NASAL specimens only), is one component of a comprehensive MRSA colonization surveillance program. It is not intended to diagnose MRSA infection nor to guide or monitor treatment for MRSA infections.   Culture, blood (routine x 2) Call MD if unable to obtain prior to antibiotics being given     Status: None   Collection Time: 12/13/15  4:46 PM  Result Value Ref Range Status   Specimen Description BLOOD LEFT ARM  Final   Special Requests BOTTLES DRAWN AEROBIC AND ANAEROBIC 5CC  Final   Culture   Final    NO GROWTH 5 DAYS Performed at [facility]    Report Status 12/18/2015 FINAL  Final  Culture, blood (routine x 2) Call MD if unable to obtain prior to antibiotics being given     Status: None   Collection Time: 12/13/15  4:46 PM  Result Value Ref Range Status   Specimen Description BLOOD LEFT ARM  Final   Special Requests BOTTLES DRAWN AEROBIC AND ANAEROBIC 5CC  Final   Culture   Final    NO GROWTH 5 DAYS Performed at [facility]    Report Status 12/18/2015 FINAL  Final  Urine culture     Status: None   Collection Time: 12/13/15  7:14 PM  Result Value Ref Range Status   Specimen Description URINE, CLEAN CATCH  Final   Special Requests NONE  Final   Culture NO GROWTH Performed at [facility]   Final   Report Status 12/15/2015 FINAL  Final  Culture, sputum-assessment     Status: None   Collection Time: 12/13/15  9:58 PM  Result Value Ref Range Status   Specimen Description SPUTUM  Final   Special Requests Immunocompromised  Final   Sputum evaluation  Final    MICROSCOPIC FINDINGS SUGGEST THAT THIS SPECIMEN IS NOT REPRESENTATIVE OF LOWER RESPIRATORY SECRETIONS. PLEASE RECOLLECT. CALLED TO M GOIN @ 2237 ON 12/13/15 BY CDAVIS   Report Status 12/13/2015 FINAL  Final     Labs: Basic Metabolic Panel:  Recent Labs Lab 12/13/15 1646 12/14/15 0338 12/15/15 0327 12/16/15 0353 12/17/15 0329 12/18/15 0512  NA  --  142 141 142 143 141  K  --  4.2 4.4 3.7 3.9 3.9  CL  --  112* 111 108 105 104  CO2  --  _0 GLUCOSE  --  88 89 94 114* 91  BUN  --  28* 30* 31* 30* 27*  CREATININE  --  1.67* 2.15* 2.11* 1.92* 1.66*  CALCIUM  --  8.4* 8.4* 8.6* 8.6* 8.5*  MG 2.1 2.0 2.0 1.7  --   --    Liver Function Tests:  Recent Labs Lab 12/13/15 1032 12/14/15 0338  AST 26 21  ALT 19 14*  ALKPHOS 66 56  BILITOT 1.1 1.2  PROT 7.1 5.6*  ALBUMIN 3.2* 2.5*   No results for input(s): LIPASE, AMYLASE in the last 168 hours. No results for input(s): AMMONIA in the last 168 hours. CBC:  Recent  Labs Lab 12/13/15 1032 12/14/15 0338 12/15/15 0327 12/16/15 0353 12/17/15 0329 12/18/15 0512  WBC 5.8 4.9 5.2 5.0 4.5 4.3  NEUTROABS 4.8  --   --  3.6 2.9  --   HGB 12.9* 10.9* 11.5* 11.9* 11.9* 11.7*  HCT 40.6 35.8* 37.7* 37.5* 37.7* 37.0*  MCV 93.1 95.0 95.4 92.4 93.5 92.3  PLT 169 135* 139* 144* 140* 118*   Cardiac Enzymes:  Recent Labs Lab 12/16/15 0859 12/16/15 1419 12/16/15 2055  TROPONINI 0.03 0.03 0.03   BNP: BNP (last 3 results)  Recent Labs  12/16/15 0353 12/17/15 0329 12/18/15 0512  BNP 144.8* 170.9* 229.8*    ProBNP (last 3 results) No results for input(s): PROBNP in the last 8760 hours.  CBG:  Recent Labs Lab 12/14/15 0754 12/15/15 0756 12/16/15 0748 12/17/15 0726 12/18/15 0757  GLUCAP 83 80 90 124* 88       Signed:  Niel Hummer A MD.  [facility] 12/18/2015, 5:05 PM

## 2015-12-18 NOTE — Progress Notes (Signed)
Occupational Therapy Treatment Patient Details Name: Luke Jacobson. MRN: [id number] DOB: Jan 23, 1941 Today's Date: 12/18/2015    History of present illness 75 y.o. male with medical history significant of heart transplantation in 1997 status post stents to the heart in 2016, chronic respiratory failure with COPD on chronic home O2 at 6-7 L nasal cannula, AAA, hypertension, hyperlipidemia who was recently hospitalized 12/05/2015 - 12/08/2015 for acute ulcerative supraglottitis and discharged on oral antibiotics presented to the ED with 1 day history of hiccups with a sensation of his throat closing up, regurgitation of clear saliva   OT comments  Progressing towards OT goals. Continue OT per plan of care.  Follow Up Recommendations  Home health OT;Supervision/Assistance - 24 hour    Equipment Recommendations  3 in 1 bedside comode    Recommendations for Other Services      Precautions / Restrictions Precautions Precautions: Fall Precaution Comments: monitor sats, chronic O2       Mobility Bed Mobility                  Transfers                      Balance                                   ADL                                         General ADL Comments: Reviewed energy conservation techniques with patient. He reports he read handout and was familiair with most techniques. He has a shower chair and reachers at home that he uses. Does not feel like he needs BSC but has been using it here and is not opposed to it. He did not want to get OOB due to not sleeping well last night and wanting to try to nap. Patient reports he may go home today with [facility] services.       Vision                     Perception     Praxis      Cognition   Behavior During Therapy: WFL for tasks assessed/performed Overall Cognitive Status: Within Functional Limits for tasks assessed                       Extremity/Trunk  Assessment               Exercises     Shoulder Instructions       General Comments      Pertinent Vitals/ Pain       Pain Assessment: No/denies pain  Home Living                                          Prior Functioning/Environment              Frequency Min 3X/week     Progress Toward Goals  OT Goals(current goals can now be found in the care plan section)  Progress towards OT goals: Progressing toward goals  Acute Rehab OT Goals Patient Stated Goal: to go home today  Plan Discharge plan  remains appropriate    Co-evaluation                 End of Session Equipment Utilized During Treatment: Oxygen   Activity Tolerance Patient limited by fatigue   Patient Left in bed;with call bell/phone within reach;with bed alarm set   Nurse Communication          Time: 9672-8979 OT Time Calculation (min): 10 min  Charges: OT General Charges $OT Visit: 1 Procedure OT Treatments $Self Care/Home Management : 8-22 mins  Aryani Daffern A 12/18/2015, 8:53 AM

## 2015-12-18 NOTE — Care Management Note (Signed)
Case Management Note  Patient Details  Name: Luke Jacobson. MRN: [id number] Date of Birth: 28-Aug-1940  Subjective/Objective: PT/OT recc HHC, 3n1. Spoke to spouse Pamala Hurry on phone-she agrees to [facility] for [employer] aware of orders-HHPT/OT/aide, await d/c. Spouse states patient will not use 3n1-[facility] dme rep Jermaine aware of order,but that patient doesn't want 3n1-will not deliver to rm.AHC active w/[facility]HC-HHRN/PT;[facility] dme home 02-was using 7l @ home, currently using 8l, also noted qualifying 02 sats. May need home 02 order for 8l continuously. [facility] dme rep aware, & will bring home 02 travel tank to rm @ d/c since spouse has ran out of 02 tanks-patient requiring increased 02 use. MD please order home 02.Spouse able to transport home on own.                   Action/Plan:d/c home w/HHC/home 02 increased liter flow order.   Expected Discharge Date:   (unknown)               Expected Discharge Plan:  [place]  In-House Referral:     Discharge planning Services     Post Acute Care Choice:  Home Health, Durable Medical Equipment (Active w/[facility]-RN//PT;[facility] home 02.) Choice offered to:  Spouse  DME Arranged:  Patient refused services DME Agency:     HH Arranged:  PT, OT, Nurse's Aide [place] Agency:  [employer]  Status of Service:  In process, will continue to follow  Medicare Important Message Given:  Yes Date Medicare IM Given:    Medicare IM give by:    Date Additional Medicare IM Given:    Additional Medicare Important Message give by:     If discussed at [place] of Stay Meetings, dates discussed:    Additional Comments:  Dessa Phi, RN 12/18/2015, 11:55 AM

## 2015-12-26 ENCOUNTER — Encounter: Payer: Self-pay | Admitting: Pulmonary Disease

## 2015-12-26 ENCOUNTER — Ambulatory Visit (HOSPITAL_BASED_OUTPATIENT_CLINIC_OR_DEPARTMENT_OTHER)
Admission: RE | Admit: 2015-12-26 | Discharge: 2015-12-26 | Disposition: A | Discharge status: Home / Self Care | Payer: Medicare Other | Source: Ambulatory Visit | Attending: Pulmonary Disease | Admitting: Pulmonary Disease

## 2015-12-26 ENCOUNTER — Other Ambulatory Visit: Payer: Self-pay | Admitting: *Deleted

## 2015-12-26 ENCOUNTER — Ambulatory Visit (INDEPENDENT_AMBULATORY_CARE_PROVIDER_SITE_OTHER): Payer: Medicare Other | Admitting: Pulmonary Disease

## 2015-12-26 VITALS — BP 126/84 | HR 98 | Ht 67.0 in | Wt 124.0 lb

## 2015-12-26 DIAGNOSIS — R937 Abnormal findings on diagnostic imaging of other parts of musculoskeletal system: Secondary | ICD-10-CM | POA: Diagnosis not present

## 2015-12-26 DIAGNOSIS — R1310 Dysphagia, unspecified: Secondary | ICD-10-CM

## 2015-12-26 DIAGNOSIS — M19042 Primary osteoarthritis, left hand: Secondary | ICD-10-CM | POA: Insufficient documentation

## 2015-12-26 DIAGNOSIS — M1990 Unspecified osteoarthritis, unspecified site: Secondary | ICD-10-CM

## 2015-12-26 DIAGNOSIS — J9611 Chronic respiratory failure with hypoxia: Secondary | ICD-10-CM | POA: Diagnosis not present

## 2015-12-26 MED ORDER — FLUTICASONE FUROATE-VILANTEROL 100-25 MCG/INH IN AEPB: 1.0000 | INHALATION_SPRAY | Freq: Every day | RESPIRATORY_TRACT | Status: DC

## 2015-12-26 MED ORDER — PREDNISONE 10 MG PO TABS: ORAL_TABLET | ORAL | Status: DC

## 2015-12-26 NOTE — Progress Notes (Signed)
Subjective:    Patient ID: Luke Oh., male    DOB: [birth year], 75 y.o.   MRN: [id number]  HPI  PCP - Swayne  Cards - [facility] , felker, transplant  [place] - victor test  75 y.o. 40 PY ex smoker (quit '95) presents for FU of COPD.  PMH ischemic cardiomyopathy, status post heart transplant in 1996, on immunosuppressants,ascending aortic aneurysm followed at [facility], hypertension, hyperlipidemia, heparin-induced thrombocytopenia   He had 2 cardiac stents placed after left heart cath. After right heart cath, he was diagnosed with pulmonary hypertension-initially placed on sildenafil- this was changed to the letairis and adcirca in 06/2015 [name] test)   12/26/2015   Chief Complaint  Patient presents with  . Hospitalization Follow-up    hospital 5/25 and 6/2 with pneumonia, ulcer on back of throat.  not eating properly, coughing up some blood once in a while.      22mFU  On his last visit, patient assistance forms were filled out for breo and Spiriva  hospitalized 12/05/2015 - 12/08/2015 for acute ulcerative supraglottitis and discharged on oral antibiotics sputum  culture  12/06/2015 consistent with Pseudomonas.   Readm 6/2- 6/7 for increasing O2 requirements on 10 L high flow oxygen , dc'd on oximiser on 6-7 L  esophagram 12/14/2015 which showed ulceration in the middle third of the esophagus at the level of the left mainstem bronchus, nonspecific esophageal dysmotility, small hiatal hernia  >> diflucan per GI   Now on 4 L His pedal edema is the best it has been-His renal function seemed to worsen with diuretics in the hospital   His imaging seemed to suggest interstitial lung disease in 05/1215 He has moderate to severe COPD and is maintained on BREO and Spiriva.    I reviewed his extensive hospitalization records including various consults by ENT and GI  Significant tests/ events  [facility] , VQ  very low prob  RHC RA 2, PCWP 5, PA 44/21 ,mean 32, CI 1.7 --> given volume, With iNO improved CI from 1.6 to 2.3    Echo 03/2014 RVSP 61 (on sildenafil), nml LV fn, LVH  05/2015 CT chest is very atypical. He has emphysema with apical and upper lobe predominence. He has increased reticular formation in the lungs which is not typical UIP but is most consistent with some degree of ILD  Spirometry 03/2012 fev 1 of 46%, FVC 68%, ratio 50  04/26/2012 ONO on 2.5 L O2 shows desatn for 3h 413m (46% of night) - Increased to 3L O2  11/29/2012 Completed pulm rehab - FEV1 49%  PFTs 12/2013 - FVC 76%, FEV1 52% , ratio 53, DLCO 20%    Past Medical History  Diagnosis Date  . Hypertension   . Shortness of breath   . Hypercholesteremia   . HIT (heparin-induced thrombocytopenia) ([place]   . COPD (chronic obstructive pulmonary disease) ([place]   . AAA (abdominal aortic aneurysm) ([place]   . Arrhythmia has had episodes of fast heart rate    Review of Systems neg for any significant sore throat, dysphagia, itching, sneezing, nasal congestion or excess/ purulent secretions, fever, chills, sweats, unintended wt loss, pleuritic or exertional cp, hempoptysis, orthopnea pnd or change in chronic leg swelling. Also denies presyncope, palpitations, heartburn, abdominal pain, nausea, vomiting, diarrhea or change in bowel or urinary habits, dysuria,hematuria, rash, arthralgias, visual complaints, headache, numbness weakness or ataxia.      Objective:  Physical Exam  Gen. Pleasant, well-nourished, in no distress ENT - no lesions, no post nasal drip Neck: No JVD, no thyromegaly, no carotid bruits Lungs: no use of accessory muscles, no dullness to percussion, lt basal  Rales, no rhonchi  Cardiovascular: Rhythm regular, heart sounds  normal, no murmurs or gallops, no peripheral edema Musculoskeletal: Left hand index finger swollen MP & DIP joints with erythema, no cyanosis or clubbing        Assessment & Plan:

## 2015-12-26 NOTE — Addendum Note (Signed)
Addended by: Mathis Dad on: 12/26/2015 03:54 PM   Modules accepted: Orders

## 2015-12-26 NOTE — Assessment & Plan Note (Signed)
Call me back to report if swallowing no better by next week and we will refer you to [facility] who saw you in the hospital Keep ENT appointment with Dr. Janace Hoard

## 2015-12-26 NOTE — Assessment & Plan Note (Signed)
-  Down to 4 L

## 2015-12-26 NOTE — Patient Instructions (Signed)
For hand arthritis - Prednisone 10 mg tabs  Take 2 tabs daily with food x 5ds, then 1 tab daily with food x 5ds then STOP -we will refer you to orthopedics  Call me back to report if swallowing no better by next week and we will refer you to [facility] who saw you in the hospital Keep ENT appointment with Dr. Janace Hoard

## 2015-12-26 NOTE — Assessment & Plan Note (Signed)
For hand arthritis - Prednisone 10 mg tabs  Take 2 tabs daily with food x 5ds, then 1 tab daily with food x 5ds then STOP -we will refer you to orthopedics

## 2016-01-08 ENCOUNTER — Telehealth: Payer: Self-pay | Admitting: Pulmonary Disease

## 2016-01-08 NOTE — Telephone Encounter (Signed)
Notes Recorded by Rigoberto Noel, MD on 01/02/2016 at 6:38 AM Please call and follow-up if was seen by orthopedics and what recommendations --  LMTCB x1

## 2016-01-09 NOTE — Telephone Encounter (Signed)
LMOMTCB x1

## 2016-01-09 NOTE — Telephone Encounter (Signed)
Patient is returning call, CB is [phone]

## 2016-01-09 NOTE — Telephone Encounter (Signed)
LMTCB x2

## 2016-01-10 DIAGNOSIS — K219 Gastro-esophageal reflux disease without esophagitis: Secondary | ICD-10-CM | POA: Insufficient documentation

## 2016-01-10 NOTE — Telephone Encounter (Signed)
lmtcb x2 for pt

## 2016-01-13 NOTE — Telephone Encounter (Signed)
Patient returned call, CB is [phone]. °

## 2016-01-13 NOTE — Telephone Encounter (Signed)
Attempted to contact patient, left message to call back.

## 2016-01-13 NOTE — Telephone Encounter (Signed)
Pt states that he has seen Orthopedics and was given a cortisone shot in his finger and will follow up with them in 4 weeks. Will send to Dr Elsworth Soho as Juluis Rainier. Nothing further needed.

## 2016-02-10 ENCOUNTER — Other Ambulatory Visit: Payer: Self-pay | Admitting: Pulmonary Disease

## 2016-02-12 ENCOUNTER — Other Ambulatory Visit: Payer: Self-pay | Admitting: Pulmonary Disease

## 2016-02-13 ENCOUNTER — Ambulatory Visit (INDEPENDENT_AMBULATORY_CARE_PROVIDER_SITE_OTHER): Payer: Medicare Other | Admitting: Adult Health

## 2016-02-13 ENCOUNTER — Encounter: Payer: Self-pay | Admitting: Adult Health

## 2016-02-13 DIAGNOSIS — R1310 Dysphagia, unspecified: Secondary | ICD-10-CM

## 2016-02-13 DIAGNOSIS — J449 Chronic obstructive pulmonary disease, unspecified: Secondary | ICD-10-CM

## 2016-02-13 DIAGNOSIS — J849 Interstitial pulmonary disease, unspecified: Secondary | ICD-10-CM

## 2016-02-13 NOTE — Progress Notes (Signed)
Subjective:    Patient ID: Luke Oh., male    DOB: [birth year], 75 y.o.   MRN: [id number]  HPI PCP - Luke Jacobson  Cards - [facility] , felker, transplant  Luke Jacobson - Luke Jacobson  75 y.o. 74 PY ex smoker (quit '95) presents for FU of COPD.  PMH ischemic cardiomyopathy, status post heart transplant in 1996, on immunosuppressants,ascending aortic aneurysm followed at [facility], hypertension, hyperlipidemia, heparin-induced thrombocytopenia   He had 2 cardiac stents placed after left heart cath. After right heart cath, he was diagnosed with pulmonary hypertension-initially placed on sildenafil- this was changed to the letairis and adcirca in 06/2015 Luke Jacobson Jacobson)   02/13/2016 Chief Complaint  Patient presents with  . Follow-up    feeling better since getting out of the hospital.  has been hospitalized with PNA 2 times since last OV.   did well while he was on Prednisone.  Still having trouble with his throat, thinks it may be related to the inhalers.   hospitalized 12/05/2015 - 12/08/2015 for acute ulcerative supraglottitis and discharged on oral antibiotics sputum  culture  12/06/2015 consistent with Pseudomonas.  Readm 6/2- 6/7 for increasing O2 requirements on 10 L high flow oxygen , dc'd on oximiser on 6-7 L esophagram 12/14/2015 which showed ulceration in the middle third of the esophagus at the level of the left mainstem bronchus, nonspecific esophageal dysmotility, small hiatal hernia  >> diflucan per GI Pnumovax is utd 05/2015.   Pt is feeling better. Has seen ENT , throat is improving but not totally healed. Has upcoming follow up with ENT soon.  Says leg swelling is doing okay, no sign wt gain.  Discussed low salt diet.    Significant tests/ events  12/2013 Dyspnea attributed to [facility] Underwent extensive evaluation at [facility]  CT neg PE , VQ very low prob  RHC RA 2, PCWP 5, PA 44/21 ,mean 32, CI 1.7 --> given volume, With iNO improved CI from 1.6 to 2.3   Echo 03/2014 RVSP 61 (on  sildenafil), nml LV fn, LVH  05/2015 CT chest is very atypical. He has emphysema with apical and upper lobe predominence. He has increased reticular formation in the lungs which is not typical UIP but is most consistent with some degree of ILD  Spirometry 03/2012 fev 1 of 46%, FVC 68%, ratio 50  04/26/2012 ONO on 2.5 L O2 shows desatn for 3h 50 m (46% of night) - Increased to 3L O2  11/29/2012 Completed pulm rehab - FEV1 49%  PFTs 12/2013 - FVC 76%, FEV1 52% , ratio 53, DLCO 20%    Past Medical History:  Diagnosis Date  . AAA (abdominal aortic aneurysm) ([place])   . Arrhythmia has had episodes of fast heart rate  . COPD (chronic obstructive pulmonary disease) ([place])   . HIT (heparin-induced thrombocytopenia) ([place])   . Hypercholesteremia   . Hypertension   . Shortness of breath     Review of Systems neg for any significant sore throat, dysphagia, itching, sneezing, nasal congestion or excess/ purulent secretions, fever, chills, sweats, unintended wt loss, pleuritic or exertional cp, hempoptysis, orthopnea pnd or change in chronic leg swelling. Also denies presyncope, palpitations, heartburn, abdominal pain, nausea, vomiting, diarrhea or change in bowel or urinary habits, dysuria,hematuria, rash, arthralgias, visual complaints, headache, numbness weakness or ataxia.      Objective:   Physical Exam  Vitals:   02/13/16 1047  BP: 126/77  Pulse: 78  Temp: 97.6 F (36.4 C)  TempSrc: Oral  SpO2: 98%  Weight: 124 lb (56.2 kg)  Height: _0  (1.702 m)     Gen. Pleasant, elderly  ENT - no lesions, no post nasal drip Neck: No JVD, no thyromegaly, no carotid bruits Lungs: no use of accessory muscles, no dullness to percussion, decreased in bases  Cardiovascular: Rhythm regular, heart sounds  normal, no murmurs or gallops, tr-1+ peripheral edema Musculoskeletal: arthritic changes in hands , no cyanosis or clubbing    Luke Mwangi NP-C  [facility]  02/13/2016

## 2016-02-13 NOTE — Patient Instructions (Addendum)
Continue on BREO and Spiriva, rinse well after use.  .  Low slat diet , legs elevated.  Follow up Dr. Elsworth Soho in 3 months and As needed   Follow up with ENT as planned next month.

## 2016-02-14 NOTE — Assessment & Plan Note (Signed)
Cont follow up with ENT as planned

## 2016-02-14 NOTE — Assessment & Plan Note (Signed)
Stable without flare  No changes

## 2016-02-14 NOTE — Assessment & Plan Note (Signed)
Compensated without flare   Plan  Continue on BREO and Spiriva, rinse well after use.  .  Follow up Dr. Elsworth Soho in 3 months and As needed

## 2016-03-11 ENCOUNTER — Telehealth: Payer: Self-pay | Admitting: Pulmonary Disease

## 2016-03-11 MED ORDER — TIOTROPIUM BROMIDE MONOHYDRATE 18 MCG IN CAPS: ORAL_CAPSULE | RESPIRATORY_TRACT | 3 refills | Status: DC

## 2016-03-11 NOTE — Telephone Encounter (Signed)
Spoke with pt. He needs [place] to call [employer]'s patient assistance program and call in his Spiriva prescription. Called [phone] and spoke with Larrie Kass. States that we can't call in a prescription. It will need to be faxed to [phone], attn: Jade. Rx has been printed and given to [facility] to have RA sign. Will route message to [facility] to ensure follow up.

## 2016-03-13 NOTE — Telephone Encounter (Signed)
Rx has been faxed. Patient aware. Nothing further needed.

## 2016-03-17 ENCOUNTER — Other Ambulatory Visit: Payer: Self-pay | Admitting: Pulmonary Disease

## 2016-04-21 DIAGNOSIS — K4020 Bilateral inguinal hernia, without obstruction or gangrene, not specified as recurrent: Secondary | ICD-10-CM | POA: Insufficient documentation

## 2016-05-28 ENCOUNTER — Other Ambulatory Visit (INDEPENDENT_AMBULATORY_CARE_PROVIDER_SITE_OTHER): Payer: Medicare Other

## 2016-05-28 ENCOUNTER — Encounter: Payer: Self-pay | Admitting: Pulmonary Disease

## 2016-05-28 ENCOUNTER — Ambulatory Visit (INDEPENDENT_AMBULATORY_CARE_PROVIDER_SITE_OTHER): Payer: Medicare Other | Admitting: Pulmonary Disease

## 2016-05-28 VITALS — BP 120/82 | HR 80 | Ht 67.0 in | Wt 109.0 lb

## 2016-05-28 DIAGNOSIS — J449 Chronic obstructive pulmonary disease, unspecified: Secondary | ICD-10-CM

## 2016-05-28 DIAGNOSIS — Z23 Encounter for immunization: Secondary | ICD-10-CM | POA: Diagnosis not present

## 2016-05-28 DIAGNOSIS — K449 Diaphragmatic hernia without obstruction or gangrene: Secondary | ICD-10-CM | POA: Diagnosis not present

## 2016-05-28 DIAGNOSIS — J9611 Chronic respiratory failure with hypoxia: Secondary | ICD-10-CM

## 2016-05-28 DIAGNOSIS — N183 Chronic kidney disease, stage 3 unspecified: Secondary | ICD-10-CM

## 2016-05-28 DIAGNOSIS — R066 Hiccough: Secondary | ICD-10-CM

## 2016-05-28 DIAGNOSIS — K4020 Bilateral inguinal hernia, without obstruction or gangrene, not specified as recurrent: Secondary | ICD-10-CM

## 2016-05-28 DIAGNOSIS — J849 Interstitial pulmonary disease, unspecified: Secondary | ICD-10-CM

## 2016-05-28 LAB — URINALYSIS
Bilirubin Urine: NEGATIVE
Hgb urine dipstick: NEGATIVE
KETONES UR: NEGATIVE
Leukocytes, UA: NEGATIVE
Nitrite: NEGATIVE
PH: 6.5 (ref 5.0–8.0)
SPECIFIC GRAVITY, URINE: 1.01 (ref 1.000–1.030)
URINE GLUCOSE: NEGATIVE
UROBILINOGEN UA: 0.2 (ref 0.0–1.0)

## 2016-05-28 LAB — BASIC METABOLIC PANEL
BUN: 31 mg/dL — AB (ref 6–23)
CALCIUM: 9.2 mg/dL (ref 8.4–10.5)
CO2: 29 mEq/L (ref 19–32)
Chloride: 103 mEq/L (ref 96–112)
Creatinine, Ser: 1.13 mg/dL (ref 0.40–1.50)
GFR: 67.37 mL/min (ref >60.00)
GLUCOSE: 103 mg/dL — AB (ref 70–99)
Potassium: 3.8 mEq/L (ref 3.5–5.1)
SODIUM: 140 meq/L (ref 135–145)

## 2016-05-28 MED ORDER — CHLORPROMAZINE HCL 25 MG PO TABS: 25.0000 mg | ORAL_TABLET | Freq: Every day | ORAL | 0 refills | Status: DC

## 2016-05-28 NOTE — Patient Instructions (Signed)
Prevnar today  Check UA and bmet  Trial of chlorpromazine 25 mg at bedtime for hiccups, if this does not work increased to 50 mg #15  Referral to surgery for hernia- we discussed high risk

## 2016-05-28 NOTE — Progress Notes (Signed)
Subjective:    Patient ID: Luke Oh., male    DOB: 1940/11/03, 75 y.o.   MRN: [id number]  HPI   PCP - Swayne  Cards - [facility] , felker, transplant  [facility] - victor test  75 y.o.Cardiac transplant recipient, ex-smoker  for FU of COPD and pulmonary hypertension .  PMH ischemic cardiomyopathy, status post heart transplant in 1996, on immunosuppressants,ascending aortic aneurysm followed at [facility], hypertension, hyperlipidemia, heparin-induced thrombocytopenia  He is a  4 PY ex smoker (quit '95)  He had 2 cardiac stents placed after left heart cath. After right heart cath, he was diagnosed with pulmonary hypertension-initially placed on sildenafil- this was changed to the letairis and adcirca in 06/2015    05/28/2016  Chief Complaint  Patient presents with  . Follow-up    Pt states that his breathing has been fair since last OV. Pt states that he is still having a lot of hiccups and is wanting to discuss this. Using O2 at 5 liters cont. Using Breo and Spiriva as directed. Pt having some difficulties with urination frequency. Pt has decreased appetite and weight loss - patient has lost 15lbs since last OV   Hospitalized 11/2015 for acute ulcerative supraglottitis and discharged on oral antibiotics >>Seen by ENT sputum  culture  12/06/2015  Pseudomonas.   Readm 12/2015  for increasing O2 requirements on 10 L high flow oxygen , dc'd on oximiser on 6-7 L  His imaging seemed to suggest interstitial lung disease in 05/1215 He has moderate to severe COPD and is maintained on BREO and Spiriva. He was seen by [name] test for pulmonary hypertension and placed on Letairis and tadalafil. He stopped Letairis due to pedal edema, on his last visit 09/2015-plan was to start him on tyvaso- but this has not happened  He has several complaints today, his breathing is slightly worse He reports bilateral inguinal hernias and states that his transplant team has cleared him to have surgery but he  requests pulmonary clearance and referral to surgery He also reports mild burning in his urinary increased frequency-he has been compliant with Spiriva  He also reports a 15 pound weight loss over the last 6 months-it seems he just has very poor appetite, he is trying a boost supplement every day  He also reports hiccups-he was given baclofen without much relief I have reviewed his extensive records from [place] tests/ events  12/2013 Dyspnea attributed to [facility] Underwent extensive evaluation at [facility]  CT neg PE , VQ very low prob  RHC RA 2, PCWP 5, PA 44/21 ,mean 32, CI 1.7 --> given volume, With iNO improved CI from 1.6 to 2.3    Echo 03/2014 RVSP 61 (on sildenafil), nml LV fn, LVH  05/2015  Diffuse granular reticulation and subtle groundglass attenuation which may be slightly increased compared to 11/2014. Mild scattered interlobular septal thickening. These findings raise the possibility of interstitial lung disease, this appearance would be inconsistent with UIP.  Spirometry 03/2012 fev 1 of 46%, FVC 68%, ratio 50  04/26/2012 ONO on 2.5 L O2 shows desatn for 3h 45 m (46% of night) - Increased to 3L O2  11/29/2012 Completed pulm rehab - FEV1 49%  PFTs 12/2013 - FVC 76%, FEV1 52% , ratio 53, DLCO 20%  PFTs 05/2015 FEV1 47% FVC 74%, TLC 80%, DLCO 14%  sputum  culture  12/06/2015  Pseudomonas.   esophagram 12/14/2015 which showed ulceration in the middle third of the esophagus at the level of the  left mainstem bronchus, nonspecific esophageal dysmotility, small hiatal hernia  >> diflucan per GI  Labs checked today-renal function is improved Urinalysis-no WBCs  Past Medical History:  Diagnosis Date  . AAA (abdominal aortic aneurysm) ([place])   . Arrhythmia has had episodes of fast heart rate  . COPD (chronic obstructive pulmonary disease) ([place])   . HIT (heparin-induced thrombocytopenia) ([place])   . Hypercholesteremia   . Hypertension   . Shortness of breath     Review of  Systems neg for any significant sore throat, dysphagia, itching, sneezing, nasal congestion or excess/ purulent secretions, fever, chills, sweats, unintended wt loss, pleuritic or exertional cp, hempoptysis, orthopnea pnd or change in chronic leg swelling.   Also denies presyncope, palpitations, heartburn, abdominal pain, nausea, vomiting, diarrhea or change in bowel or urinary habits, dysuria,hematuria, rash, arthralgias, visual complaints, headache, numbness weakness or ataxia.     Objective:   Physical Exam  Gen. Pleasant, thin, in no distress, normal affect ENT - no lesions, no post nasal drip Neck: No JVD, no thyromegaly, no carotid bruits Lungs: no use of accessory muscles, no dullness to percussion, Decreased without rales or rhonchi  Cardiovascular: Rhythm regular, heart sounds  normal, no murmurs or gallops, no peripheral edema Abdomen: soft and non-tender, no hepatosplenomegaly, BS normal. Musculoskeletal: No deformities, no cyanosis or clubbing Neuro:  alert, non focal       Assessment & Plan:

## 2016-05-28 NOTE — Assessment & Plan Note (Signed)
Prevnar today He will continue 4-5 L of oxygen

## 2016-05-29 ENCOUNTER — Telehealth: Payer: Self-pay | Admitting: Pulmonary Disease

## 2016-05-29 DIAGNOSIS — J849 Interstitial pulmonary disease, unspecified: Secondary | ICD-10-CM

## 2016-05-29 DIAGNOSIS — K4090 Unilateral inguinal hernia, without obstruction or gangrene, not specified as recurrent: Secondary | ICD-10-CM | POA: Insufficient documentation

## 2016-05-29 NOTE — Assessment & Plan Note (Signed)
Unclear etiology-there is no evidence of diaphragmatic irritation Trial of baclofen has not worked  Trial of chlorpromazine 25 mg at bedtime for hiccups, if this does not work increased to 50 mg #15

## 2016-05-29 NOTE — Assessment & Plan Note (Signed)
He needs follow-up high-resolution CT chest Features on CT 05/2015 were atypical for UIP

## 2016-05-29 NOTE — Telephone Encounter (Signed)
I reviewed his [facility] records in more detail.  He needs high-resolution CT scan of the chest, no contrast-ILD protocol -1 year follow-up from 2016 Please order if he is agreeable  He should also follow-up with dr Test for pulmonary hypertension -I see that they had planned for him to get inhaled medication TYVASO

## 2016-05-29 NOTE — Assessment & Plan Note (Signed)
Referral to surgery for hernia- we discussed high risk   Probably best to pursue conservative measures such as truss. Avoid increased abdominal pressure with coughing, use stool softener for constipation  He would be high risk for surgery from a pulmonary standpoint, if at all - could contemplate doing under spinal anesthesia

## 2016-05-29 NOTE — Assessment & Plan Note (Signed)
Continue Spiriva and breo

## 2016-06-05 NOTE — Telephone Encounter (Signed)
LM x 1 

## 2016-06-08 NOTE — Telephone Encounter (Signed)
Patient returning call - he can be reached at [phone] -pr

## 2016-06-08 NOTE — Telephone Encounter (Signed)
Pt aware of rec's per RA Order has been placed for HRCT ILD Protocol  Patient is calling [facility] back today regarding hernia appt.  Nothing further needed.

## 2016-06-10 ENCOUNTER — Ambulatory Visit (INDEPENDENT_AMBULATORY_CARE_PROVIDER_SITE_OTHER)
Admission: RE | Admit: 2016-06-10 | Discharge: 2016-06-10 | Disposition: A | Discharge status: Home / Self Care | Payer: Medicare Other | Source: Ambulatory Visit | Attending: Pulmonary Disease | Admitting: Pulmonary Disease

## 2016-06-10 DIAGNOSIS — J849 Interstitial pulmonary disease, unspecified: Secondary | ICD-10-CM | POA: Diagnosis not present

## 2016-06-16 ENCOUNTER — Telehealth: Payer: Self-pay | Admitting: Pulmonary Disease

## 2016-06-16 NOTE — Telephone Encounter (Signed)
PA request received for Chlorpromazine.  This was initially initiated through [facility], but PA would not send to plan without uploading supporting documents, which we are unable to do.  Form was also faxed with CMM information- this form was filled to completion and faxed to [facility] part D determinations at [phone].  Form is being held in blue PA accordion folder.    CMM Key: [id number]  Will forward to [place] to follow up on PA.

## 2016-06-17 ENCOUNTER — Other Ambulatory Visit: Payer: Self-pay | Admitting: Pulmonary Disease

## 2016-06-17 ENCOUNTER — Other Ambulatory Visit: Payer: Self-pay

## 2016-06-17 MED ORDER — CHLORPROMAZINE HCL 25 MG PO TABS: 25.0000 mg | ORAL_TABLET | Freq: Every day | ORAL | 0 refills | Status: DC

## 2016-06-17 NOTE — Telephone Encounter (Signed)
Spoke with pt, aware of rx being sent in.  Nothing further needed.

## 2016-06-17 NOTE — Telephone Encounter (Signed)
Caryl Pina from [place] is calling on behalf of the patient. She is calling about the request for the Chlorpromazine. Medication have been approved for a year. A letter will be sent out stating the approval.

## 2016-06-17 NOTE — Telephone Encounter (Signed)
Patient request for medication Chlorpromazine to be sent to [facility] on [address] and [address]

## 2016-06-17 NOTE — Telephone Encounter (Signed)
Spoke with [employer] they approved the pt. For the Chlorpromazine 35m. Effective dates 12/17-12/5/18. Attempted to call pt. To verify which pharmacy he wanted this to go to but was unable to reach him, left a vm tcb x1. No rx as of yet has been sent in.

## 2016-06-19 ENCOUNTER — Telehealth: Payer: Self-pay | Admitting: Pulmonary Disease

## 2016-06-19 NOTE — Telephone Encounter (Signed)
Notes Recorded by Luke Noel, MD on 06/11/2016 at 12:40 PM EST He has evidence of pulmonary scarring-which may be related to inflammation. CellCept works as an anti-inflammatory medication- no other changes to his meds  Pt aware of results and voiced understanding. Nothing further needed.

## 2016-07-28 ENCOUNTER — Encounter: Payer: Self-pay | Admitting: Adult Health

## 2016-07-28 ENCOUNTER — Ambulatory Visit (INDEPENDENT_AMBULATORY_CARE_PROVIDER_SITE_OTHER): Payer: Medicare Other | Admitting: Adult Health

## 2016-07-28 DIAGNOSIS — I272 Pulmonary hypertension, unspecified: Secondary | ICD-10-CM | POA: Diagnosis not present

## 2016-07-28 DIAGNOSIS — J849 Interstitial pulmonary disease, unspecified: Secondary | ICD-10-CM | POA: Diagnosis not present

## 2016-07-28 DIAGNOSIS — J9611 Chronic respiratory failure with hypoxia: Secondary | ICD-10-CM

## 2016-07-28 DIAGNOSIS — J449 Chronic obstructive pulmonary disease, unspecified: Secondary | ICD-10-CM | POA: Diagnosis not present

## 2016-07-28 NOTE — Assessment & Plan Note (Signed)
Stable w/out flare on BREO Spiriva   Patient Instructions  Continue on BREO and Spiriva, rinse well after use.  .  Low slat diet , legs elevated.  Follow up with [facility] as discuseed  Follow up Dr. Elsworth Soho in 4 months and As needed

## 2016-07-28 NOTE — Progress Notes (Signed)
_0  ID: Luke Oh., male    DOB: 1940/09/14, 76 y.o.   MRN: [id number]  Chief Complaint  Patient presents with  . Follow-up    COPD     Referring provider: Antony Contras, MD  HPI: PCP - [facility]  Cards - [facility] , [facility], transplant  Luke [facility]son - [facility]  76 y.o. 4 PY ex smoker (quit '95) presents for FU of COPD.  PMH ischemic cardiomyopathy, status post heart transplant in 1996, on immunosuppressants,ascending aortic aneurysm followed at [facility], hypertension, hyperlipidemia, heparin-induced thrombocytopenia    He had 2 cardiac stents placed after left heart cath. After right heart cath, he was diagnosed with pulmonary hypertension-initially placed on sildenafil- this was changed to the letairis and adcirca in 06/2015 [facility])  [facility]  [facility]  CT neg PE , VQ very low prob  RHC RA 2, PCWP 5, PA 44/21 ,mean 32, CI 1.7 --> given volume, With iNO improved CI from 1.6 to 2.3  Echo 03/2014 RVSP 61 (on sildenafil), nml LV fn, LVH 05/2015 CT chest is very atypical. He has emphysema with apical and upper lobe predominence. He has increased reticular formation in the lungs which is not typical UIP but is most consistent with some degree of ILD Spirometry 03/2012 fev 1 of 46%, FVC 68%, ratio 50  04/26/2012 ONO on 2.5 L O2 shows desatn for 3h 61 m (46% of night) - Increased to 3L O2  11/29/2012 Completed pulm rehab - FEV1 49%  PFTs 12/2013 - FVC 76%, FEV1 52% , ratio 53, DLCO 20%  HRCT Chest >mod emphysema, ILD /NSIP changes (patchy areas of septal thickening, few subpleural reticulation)    07/28/2016 Follow up : COPD/ILD, O2 RF , Pulmonary HTN , Heart Transplant   He has pulmonary HTN followed at [facility] ([facility]) on [facility] . Previously on Letaaris but stopped due to edema. He has not seen them in a while . Advised to set up follow up .   Has O2 at 2l/m rest and 4l/m act . This is down from 6 months ago.   Appetite remains low . Wt is down few lbs. Advised to  increase ensure Twice daily  .   Seen by transplant team in Oct 2018 .   Remains on Spriva and BREO  for COPD/Emphysema. Breathing is at baseline . With no flare of cough or wheezing . HRCT chest shows ILD/NSIP in Nov . Felt scarring stable and already on Cellcept (transplant) .     Allergies  Allergen Reactions  . Ambrisentan Other (See Comments), Itching and Swelling  . Heparin     Made white blood count go down. HIT    Immunization History  Administered Date(s) Administered  . Influenza Split 04/13/2011, 03/29/2012  . Influenza,inj,Quad PF,36+ Mos 04/04/2013, 04/05/2014  . Influenza-Unspecified 05/23/2015, 04/27/2016  . Pneumococcal Conjugate-13 05/28/2016  . Pneumococcal Polysaccharide-23 07/13/2010    Past Medical History:  Diagnosis Date  . AAA (abdominal aortic aneurysm) ([place])   . Arrhythmia has had episodes of fast heart rate  . COPD (chronic obstructive pulmonary disease) ([place])   . HIT (heparin-induced thrombocytopenia) ([place])   . Hypercholesteremia   . Hypertension   . Shortness of breath     Tobacco History: History  Smoking Status  . Former Smoker  . Packs/day: 1.00  . Years: 30.00  . Types: Cigarettes  . Quit date: 03/10/1994  Smokeless Tobacco  . Never Used   Counseling given: Not Answered   Outpatient Encounter Prescriptions  as of 07/28/2016  Medication Sig  . acetaminophen (TYLENOL) 500 MG tablet Take 500-1,000 mg by mouth daily as needed for fever.   Marland Kitchen albuterol (PROAIR HFA) 108 (90 Base) MCG/ACT inhaler Inhale 2 puffs into the lungs every 6 (six) hours as needed.  Marland Kitchen aspirin EC 81 MG tablet Take 81 mg by mouth at bedtime.   Marland Kitchen BREO ELLIPTA 100-25 MCG/INH AEPB INHALE 1 PUFF INTO THE LUNGS DAILY  . clopidogrel (PLAVIX) 75 MG tablet Take 75 mg by mouth daily.  . Coenzyme Q10 (CO Q10 PO) Take 500 mg by mouth daily.  . cycloSPORINE modified (NEORAL) 25 MG capsule Take 75 mg by mouth 2 (two) times daily.   . fexofenadine (ALLEGRA) 180 MG tablet Take  180 mg by mouth daily.  . furosemide (LASIX) 20 MG tablet Take 2 tablets (40 mg total) by mouth daily.  Marland Kitchen lactose free nutrition (BOOST) LIQD Take 237 mLs by mouth daily.  . magnesium oxide (MAG-OX) 400 MG tablet Take 400 mg by mouth 5 (five) times daily.   . Multiple Vitamin (MULTIVITAMIN) tablet Take 1 tablet by mouth daily.  . mycophenolate (CELLCEPT) 500 MG tablet Take 1,500 mg by mouth 2 (two) times daily.  . Omega-3 Fatty Acids (FISH OIL) 1000 MG CAPS Take 1 capsule by mouth every evening.  Marland Kitchen omeprazole (PRILOSEC) 40 MG capsule Take 40 mg by mouth daily.  . simvastatin (ZOCOR) 40 MG tablet Take 40 mg by mouth daily.  Marland Kitchen SPIRIVA HANDIHALER 18 MCG inhalation capsule INHALE THE CONTENTS OF 1 CAPSULE VIA HANDIHALER ONCE DAILY AS DIRECTED  . temazepam (RESTORIL) 15 MG capsule Take 30 mg by mouth at bedtime as needed. For sleep  . ADCIRCA 20 MG TABS Take 40 mg by mouth daily.   . chlorproMAZINE (THORAZINE) 25 MG tablet TAKE 1 TABLET BY MOUTH AT BEDTIME. MAY INCREASE TO 2 TABLETS IF HICCUPS PERSIST. (Patient not taking: Reported on 07/28/2016)  . fluconazole (DIFLUCAN) 100 MG tablet Take 1 tablet (100 mg total) by mouth daily. (Patient not taking: Reported on 07/28/2016)  . guaiFENesin (MUCINEX) 600 MG 12 hr tablet Take 2 tablets (1,200 mg total) by mouth 2 (two) times daily. (Patient not taking: Reported on 07/28/2016)  . [DISCONTINUED] BREO ELLIPTA 100-25 MCG/INH AEPB INHALE 1 PUFF INTO THE LUNGS DAILY   No facility-administered encounter medications on file as of 07/28/2016.      Review of Systems  Constitutional:   No  weight loss, night sweats,  Fevers, chills,  +fatigue, or  lassitude.  HEENT:   No headaches,  Difficulty swallowing,  Tooth/dental problems, or  Sore throat,                No sneezing, itching, ear ache, nasal congestion, post nasal drip,   CV:  No chest pain,  Orthopnea, PND, swelling in lower extremities, anasarca, dizziness, palpitations, syncope.   GI  No heartburn,  indigestion, abdominal pain, nausea, vomiting, diarrhea, change in bowel habits, loss of appetite, bloody stools.   Resp:   No chest wall deformity  Skin: no rash or lesions.  GU: no dysuria, change in color of urine, no urgency or frequency.  No flank pain, no hematuria   MS:  No joint pain or swelling.  No decreased range of motion.  No back pain.    Physical Exam  BP 122/70   Pulse 68   Ht _0  (1.702 m)   Wt 120 lb (54.4 kg)   SpO2 95%   BMI 18.79 kg/m  GEN: A/Ox3; pleasant , NAD, elderly and frail.    HEENT:  [place]/AT,  EACs-clear, TMs-wnl, NOSE-clear, THROAT-clear, no lesions, no postnasal drip or exudate noted.   NECK:  Supple w/ fair ROM; no JVD; normal carotid impulses w/o bruits; no thyromegaly or nodules palpated; no lymphadenopathy.    RESP  Decreased BS in bases ,  no accessory muscle use, no dullness to percussion  CARD:  RRR, no m/r/g, tr  peripheral edema, pulses intact, no cyanosis or clubbing.  GI:   Soft & nt; nml bowel sounds; no organomegaly or masses detected.   Musco: Warm bil, no deformities or joint swelling noted.   Neuro: alert, no focal deficits noted.    Skin: Warm, no lesions or rashes  Psych:  No change in mood or affect. No depression or anxiety.  No memory loss.  Lab Results:  CBC    Component Value Date/Time   WBC 4.3 12/18/2015 0512   RBC 4.01 (L) 12/18/2015 0512   HGB 11.7 (L) 12/18/2015 0512   HCT 37.0 (L) 12/18/2015 0512   PLT 118 (L) 12/18/2015 0512   MCV 92.3 12/18/2015 0512   MCH 29.2 12/18/2015 0512   MCHC 31.6 12/18/2015 0512   RDW 13.6 12/18/2015 0512   LYMPHSABS 0.6 (L) 12/17/2015 0329   MONOABS 0.8 12/17/2015 0329   EOSABS 0.1 12/17/2015 0329   BASOSABS 0.0 12/17/2015 0329    BMET    Component Value Date/Time   NA 140 05/28/2016 1252   K 3.8 05/28/2016 1252   CL 103 05/28/2016 1252   CO2 29 05/28/2016 1252   GLUCOSE 103 (H) 05/28/2016 1252   BUN 31 (H) 05/28/2016 1252   CREATININE 1.13 05/28/2016 1252     CREATININE 1.26 (H) 02/28/2015 0001   CALCIUM 9.2 05/28/2016 1252   GFRNONAA 39 (L) 12/18/2015 0512   GFRAA 46 (L) 12/18/2015 0512    BNP    Component Value Date/Time   BNP 229.8 (H) 12/18/2015 0512   BNP 178.8 (H) 02/28/2015 0001    ProBNP    Component Value Date/Time   PROBNP 492.2 (H) 03/10/2012 1400    Imaging: No results found.   Assessment & Plan:   Chronic respiratory failure (HCC) Cont on o2 -demands are less  Cont on 2l/m rest , 4l/m act    COPD (chronic obstructive pulmonary disease) (HCC) Stable w/out flare on BREO Spiriva   Patient Instructions  Continue on BREO and Spiriva, rinse well after use.  .  Low slat diet , legs elevated.  Follow up with [facility] as discuseed  Follow up Luke. Elsworth Jacobson in 4 months and As needed       Pulmonary hypertension ([place]) Cont on current regimen  Keep follow up with pulmonary HTN clinic at [facility]   ILD (interstitial lung disease) ([place]) HRCT shows ILD/NSIP changes  Cont on O2  Already on cellcept (transplant )  Would check PFT in 6 months      Hodari Chuba, NP 07/28/2016

## 2016-07-28 NOTE — Assessment & Plan Note (Signed)
Cont on current regimen  Keep follow up with pulmonary HTN clinic at [facility]

## 2016-07-28 NOTE — Patient Instructions (Addendum)
Continue on BREO and Spiriva, rinse well after use.  .  Low slat diet , legs elevated.  Follow up with [facility] as discuseed  Follow up Dr. Elsworth Soho in 4 months and As needed

## 2016-07-28 NOTE — Assessment & Plan Note (Signed)
Cont on o2 -demands are less  Cont on 2l/m rest , 4l/m act

## 2016-07-28 NOTE — Assessment & Plan Note (Signed)
HRCT shows ILD/NSIP changes  Cont on O2  Already on cellcept (transplant )  Would check PFT in 6 months

## 2016-07-30 ENCOUNTER — Ambulatory Visit: Payer: Medicare Other | Admitting: Pulmonary Disease

## 2016-08-02 NOTE — Progress Notes (Signed)
Reviewed & agree with plan  

## 2016-08-18 ENCOUNTER — Telehealth: Payer: Self-pay | Admitting: Pulmonary Disease

## 2016-08-18 NOTE — Telephone Encounter (Signed)
Pt aware that we do not currently have samples of breo 100. I have advised pt to call back next week to see if we have gotten any samples in. Pt voiced his understanding and had no further questions. Nothing further needed.

## 2016-08-19 ENCOUNTER — Telehealth: Payer: Self-pay | Admitting: Pulmonary Disease

## 2016-08-19 NOTE — Telephone Encounter (Signed)
Spoke with pt, states that his heart team at [facility] has requested pt speak to RA about his Adcirca.  Pt states that Dr. Karena Addison (cardiologist) had been managing pt's Adcirca, but he has recently relocated to [place].  Because of the high copay of this medication, pt is already in the donut hole this year for his rx coverage.  Pt's team at [facility] has suggested that pt reach out to RA to look into cheaper alternatives.  Pt is taking Adcirca 20 mg 2 tablets daily. RA please advise on Adcirca alternatives.  Thanks!

## 2016-08-20 ENCOUNTER — Telehealth: Payer: Self-pay | Admitting: Pulmonary Disease

## 2016-08-20 NOTE — Telephone Encounter (Signed)
LMCB 

## 2016-08-20 NOTE — Telephone Encounter (Signed)
Spoke with pt. He is aware of RA's recommendation. States that he will contact his insurance company about Sildenafil. Nothing further was needed at this time.

## 2016-08-20 NOTE — Telephone Encounter (Signed)
Alternative would be sildenafil 10 mg 3 times a day Can you check with his insurance if this would be cheaper?

## 2016-08-24 ENCOUNTER — Encounter: Payer: Self-pay | Admitting: Pulmonary Disease

## 2016-08-24 NOTE — Telephone Encounter (Signed)
lmtcb x2 for pt. 

## 2016-08-25 NOTE — Telephone Encounter (Signed)
lmtcb

## 2016-08-26 NOTE — Telephone Encounter (Signed)
lmom tcb x3

## 2016-08-26 NOTE — Telephone Encounter (Signed)
RA  Please Advise-   Spoke with pt. And he stated he is already on Sildenafil 67m once a day. He wanted to know if you what else do you suggest? Also he stated he wanted to know if a 90 supply of his Adcirca written from our office help?    RRigoberto Noel MD  to [facility] Triage Pool     11:03 AM  Note    Alternative would be sildenafil 10 mg 3 times a day Can you check with his insurance if this would be cheaper?

## 2016-08-26 NOTE — Telephone Encounter (Signed)
Pt returning call.Luke Jacobson ° °

## 2016-08-27 NOTE — Telephone Encounter (Signed)
He can find out if 90 d supply will be cheaper We can write Rx if required

## 2016-08-27 NOTE — Telephone Encounter (Signed)
lmtcb x1 for pt. 

## 2016-08-28 MED ORDER — ADCIRCA 20 MG PO TABS: 40.0000 mg | ORAL_TABLET | Freq: Every day | ORAL | 1 refills | Status: DC

## 2016-08-28 NOTE — Telephone Encounter (Signed)
lmomtcb x 2

## 2016-08-28 NOTE — Telephone Encounter (Signed)
Spoke with pt. He would like for [place] to send in a 90 day supply of Adcirca. This has been taken care of. Nothing further was needed.

## 2016-09-03 ENCOUNTER — Other Ambulatory Visit: Payer: Self-pay

## 2016-09-03 ENCOUNTER — Telehealth: Payer: Self-pay | Admitting: Pulmonary Disease

## 2016-09-03 MED ORDER — FLUTICASONE FUROATE-VILANTEROL 100-25 MCG/INH IN AEPB: 1.0000 | INHALATION_SPRAY | Freq: Every day | RESPIRATORY_TRACT | 3 refills | Status: DC

## 2016-09-03 MED ORDER — TIOTROPIUM BROMIDE MONOHYDRATE 18 MCG IN CAPS: ORAL_CAPSULE | RESPIRATORY_TRACT | 4 refills | Status: DC

## 2016-09-03 NOTE — Telephone Encounter (Signed)
Message will be routed to [facility] for follow up

## 2016-09-03 NOTE — Telephone Encounter (Signed)
Received paperwork, will get RA to sign tomorrow.

## 2016-09-07 NOTE — Telephone Encounter (Signed)
Paperwork was signed and faxed on Friday. Forms are available for the patient to pick up.

## 2016-11-03 ENCOUNTER — Telehealth: Payer: Self-pay | Admitting: Pulmonary Disease

## 2016-11-03 NOTE — Telephone Encounter (Signed)
lmomtcb x 1 for the pt

## 2016-11-04 NOTE — Telephone Encounter (Signed)
Patient returning call - He can be reached at [phone] -pr

## 2016-11-04 NOTE — Telephone Encounter (Signed)
Called and spoke with pt and he stated that he is a pt of RA.  He wanted to know of a new doctor that he could see, since he is not satisfied with his current primary care doctor.  He stated that it is really hard to get in to see them.    I did give him the number to the [facility] on [address] since this is a good location for the pt and it is close to his home.  He was given the number and he will call to set up an appt with new provider.

## 2016-11-11 ENCOUNTER — Encounter: Payer: Self-pay | Admitting: Family Medicine

## 2016-11-11 ENCOUNTER — Telehealth: Payer: Self-pay | Admitting: Family Medicine

## 2016-11-11 ENCOUNTER — Ambulatory Visit (INDEPENDENT_AMBULATORY_CARE_PROVIDER_SITE_OTHER): Payer: Medicare Other | Admitting: Family Medicine

## 2016-11-11 VITALS — BP 128/80 | HR 71 | Temp 97.6°F | Ht 67.0 in | Wt 115.6 lb

## 2016-11-11 DIAGNOSIS — F320 Major depressive disorder, single episode, mild: Secondary | ICD-10-CM | POA: Diagnosis not present

## 2016-11-11 MED ORDER — FLUOXETINE HCL 20 MG PO TABS: 20.0000 mg | ORAL_TABLET | Freq: Every day | ORAL | 3 refills | Status: DC

## 2016-11-11 NOTE — Progress Notes (Signed)
Luke Jacobson. is a 76 y.o. male is here to [place].   History of Present Illness:   Water quality scientist, CMA, acting as scribe for Dr. Juleen China.  Neck Pain   This is a chronic problem. The current episode started more than 1 month ago. The problem occurs intermittently (3-4 times per week). The problem has been unchanged. The pain is associated with nothing. The pain is present in the left side. The quality of the pain is described as aching and cramping. The pain is moderate. The symptoms are aggravated by position. The pain is same all the time. Stiffness is present in the morning. Pertinent negatives include no chest pain, fever or headaches. He has tried nothing for the symptoms. The treatment provided mild relief.  Depression         This is a recurrent problem.  The current episode started more than 1 month ago.   The onset quality is gradual.   The problem occurs constantly.The problem is unchanged.  Associated symptoms include fatigue, decreased interest and appetite change.  Associated symptoms include no headaches and no suicidal ideas.     The symptoms are aggravated by nothing.  Past treatments include nothing.  Risk factors include marital problems and major life event.   Past medical history includes chronic illness.    Health Maintenance Due  Topic Date Due  . TETANUS/TDAP  02/05/1961  . COLONOSCOPY  02/06/1992   PMHx, SurgHx, SocialHx, Medications, and Allergies were reviewed in the Visit Navigator and updated as appropriate.   Past Medical History:  Diagnosis Date  . AAA (abdominal aortic aneurysm) ([place])   . Arrhythmia   . Arthritis   . Asthma   . Blood transfusion without reported diagnosis   . COPD (chronic obstructive pulmonary disease) ([place])   . Depression   . HIT (heparin-induced thrombocytopenia) ([place])   . Hypercholesteremia   . Hypertension   . Shortness of breath    Past Surgical History:  Procedure Laterality Date  . heart stents  Sept 2016   2  stents  . HEART TRANSPLANT     Family History  Problem Relation Age of Onset  . Heart attack Father   . Heart disease Mother   . Rheum arthritis Mother   . Rheum arthritis Paternal Grandfather   . Cancer Paternal Grandfather    Social History  Substance Use Topics  . Smoking status: Former Smoker    Packs/day: 1.00    Years: 30.00    Types: Cigarettes    Quit date: 03/10/1994  . Smokeless tobacco: Never Used  . Alcohol use Yes     Comment: social   Current Medications and Allergies:   .  ADCIRCA 20 MG tablet, , Disp: , Rfl: 4 .  albuterol (PROAIR HFA) 108 (90 Base) MCG/ACT inhaler, Inhale 2 puffs into the lungs every 6 (six) hours as needed., Disp: 1 Inhaler, Rfl: 3 .  aspirin EC 81 MG tablet, Take 81 mg by mouth at bedtime. , Disp: , Rfl:  .  clopidogrel (PLAVIX) 75 MG tablet, Take 75 mg by mouth daily., Disp: , Rfl: 11 .  Coenzyme Q10 (CO Q10 PO), Take 500 mg by mouth daily., Disp: , Rfl:  .  cycloSPORINE modified (NEORAL) 25 MG capsule, Take 75 mg by mouth 2 (two) times daily. , Disp: , Rfl:  .  fexofenadine (ALLEGRA) 180 MG tablet, Take 180 mg by mouth daily., Disp: , Rfl:  .  fluconazole (DIFLUCAN) 100 MG tablet,  Take 1 tablet (100 mg total) by mouth daily., Disp: 7 tablet, Rfl: 0 .  fluticasone furoate-vilanterol (BREO ELLIPTA) 100-25 MCG/INH AEPB, Inhale 1 puff into the lungs daily., Disp: 60 each, Rfl: 3 .  furosemide (LASIX) 20 MG tablet, Take 2 tablets (40 mg total) by mouth daily., Disp: 30 tablet, Rfl: 0 .  lactose free nutrition (BOOST) LIQD, Take 237 mLs by mouth daily., Disp: , Rfl:  .  magnesium oxide (MAG-OX) 400 MG tablet, Take 400 mg by mouth 5 (five) times daily. , Disp: , Rfl:  .  Multiple Vitamin (MULTIVITAMIN) tablet, Take 1 tablet by mouth daily., Disp: , Rfl:  .  mycophenolate (CELLCEPT) 500 MG tablet, Take 1,500 mg by mouth 2 (two) times daily., Disp: , Rfl:  .  Omega-3 Fatty Acids (FISH OIL) 1000 MG CAPS, Take 1 capsule by mouth every evening., Disp: ,  Rfl:  .  omeprazole (PRILOSEC) 40 MG capsule, Take 40 mg by mouth daily., Disp: , Rfl:  .  simvastatin (ZOCOR) 40 MG tablet, Take 40 mg by mouth daily., Disp: , Rfl:  .  tadalafil (CIALIS) 20 MG tablet, Take 40 mg by mouth daily., Disp: , Rfl:  .  temazepam (RESTORIL) 15 MG capsule, Take 30 mg by mouth at bedtime as needed. For sleep, Disp: , Rfl:  .  tiotropium (SPIRIVA HANDIHALER) 18 MCG inhalation capsule, INHALE THE CONTENTS OF 1 CAPSULE VIA HANDIHALER ONCE DAILY AS DIRECTED, Disp: 30 capsule, Rfl: 4  Allergies  Allergen Reactions  . Ambrisentan Other (See Comments), Itching and Swelling  . Heparin     Made white blood count go down. HIT   Review of Systems:   Review of Systems  Constitutional: Positive for appetite change and fatigue. Negative for chills and fever.  HENT: Negative for congestion, ear pain, sinus pain and sore throat.   Eyes: Negative for blurred vision.  Respiratory: Positive for cough and shortness of breath.        Coughing in morning.  Patient on oxygen therapy.  Cardiovascular: Negative for chest pain and palpitations.  Gastrointestinal: Negative for abdominal pain, nausea and vomiting.  Genitourinary: Negative for frequency.  Musculoskeletal: Positive for neck pain. Negative for back pain and falls.  Skin: Negative for rash.  Neurological: Negative for dizziness, loss of consciousness and headaches.  Psychiatric/Behavioral: Positive for depression. Negative for suicidal ideas. The patient is not nervous/anxious.    Vitals:   Vitals:   11/11/16 1119  BP: 128/80  Pulse: 71  Temp: 97.6 F (36.4 C)  TempSrc: Oral  SpO2: 95%  Weight: 115 lb 9.6 oz (52.4 kg)  Height: _0  (1.702 m)     Body mass index is 18.11 kg/m.  Physical Exam:   Physical Exam  Constitutional: He is oriented to person, place, and time. He appears well-developed and well-nourished. No distress.  HENT:  Head: Normocephalic and atraumatic.  Right Ear: External ear normal.    Left Ear: External ear normal.  Nose: Nose normal.  Mouth/Throat: Oropharynx is clear and moist.  Eyes: Conjunctivae and EOM are normal. Pupils are equal, round, and reactive to light.  Neck: Normal range of motion. Neck supple.  Cardiovascular: Normal rate, regular rhythm, normal heart sounds and intact distal pulses.   Pulmonary/Chest:  O2 [place].  Abdominal: Soft. Bowel sounds are normal.  Musculoskeletal: Normal range of motion.  Neurological: He is alert and oriented to person, place, and time.  Skin: Skin is warm and dry.  Psychiatric: He has a normal mood and  affect. His behavior is normal. Judgment and thought content normal.  Nursing note and vitals reviewed.  Assessment and Plan:   Judd was seen today for establish care, neck pain and depression.  Diagnoses and all orders for this visit:  Current mild episode of major depressive disorder without prior episode ([place]) Comments: New. After discussion, patient would like to start below medication. Expectations, risks, and potential side effects reviewed. Patient was given information on SSRIs and possible side effects were reviewed. He was asked to contact us with any worsening in symptoms or suicidal thoughts and we discussed that it would take 2-4 weeks to begin to see improvement in his symptoms.  Orders: -     FLUoxetine (PROZAC) 20 MG tablet; Take 1 tablet (20 mg total) by mouth daily.   . Reviewed expectations re: course of current medical issues. . Discussed self-management of symptoms. . Outlined signs and symptoms indicating need for more acute intervention. . Patient verbalized understanding and all questions were answered. . See orders for this visit as documented in the electronic medical record. . Patient received an After Visit Summary.  Records requested if needed. I spent 30 minutes with this patient, greater than 50% was face-to-face time counseling regarding the above diagnoses.  CMA served as Education administrator during  this visit. History, Physical, and Plan performed by medical provider. Documentation and orders reviewed and attested to. Briscoe Deutscher, D.O.  Briscoe Deutscher, [place], [address] [place] [address] 11/11/2016  Future Appointments Date Time Provider [place]  11/27/2016 1:45 PM Rigoberto Noel, MD [facility] None

## 2016-11-11 NOTE — Telephone Encounter (Signed)
Was the patient supposed to get some kind of form??

## 2016-11-11 NOTE — Telephone Encounter (Signed)
Patient called and thought that he was supposed to get a bipolar form at the end of his visit. Patient requests a call on his home phone to advise on how to receive this form and it is okay to leave a detailed message.

## 2016-11-12 NOTE — Telephone Encounter (Signed)
Yes. I completely forgot. I wanted to give him a bipolar/mood disorder questionnaire to take home to review with his wife.

## 2016-11-12 NOTE — Telephone Encounter (Signed)
Left message advising patient that questionnaire will be placed in the mail for him today.

## 2016-11-25 ENCOUNTER — Telehealth: Payer: Self-pay | Admitting: Family Medicine

## 2016-11-25 NOTE — Telephone Encounter (Signed)
ROI Fax to [facility] @ [place] office

## 2016-11-27 ENCOUNTER — Encounter: Payer: Self-pay | Admitting: Pulmonary Disease

## 2016-11-27 ENCOUNTER — Ambulatory Visit (INDEPENDENT_AMBULATORY_CARE_PROVIDER_SITE_OTHER): Payer: Medicare Other | Admitting: Pulmonary Disease

## 2016-11-27 ENCOUNTER — Ambulatory Visit (INDEPENDENT_AMBULATORY_CARE_PROVIDER_SITE_OTHER)
Admission: RE | Admit: 2016-11-27 | Discharge: 2016-11-27 | Disposition: A | Discharge status: Home / Self Care | Payer: Medicare Other | Source: Ambulatory Visit | Attending: Pulmonary Disease | Admitting: Pulmonary Disease

## 2016-11-27 VITALS — BP 118/68 | HR 81 | Ht 67.0 in | Wt 115.2 lb

## 2016-11-27 DIAGNOSIS — J449 Chronic obstructive pulmonary disease, unspecified: Secondary | ICD-10-CM

## 2016-11-27 DIAGNOSIS — J849 Interstitial pulmonary disease, unspecified: Secondary | ICD-10-CM

## 2016-11-27 MED ORDER — FLUTICASONE FUROATE-VILANTEROL 100-25 MCG/INH IN AEPB: 1.0000 | INHALATION_SPRAY | Freq: Every day | RESPIRATORY_TRACT | 3 refills | Status: DC

## 2016-11-27 NOTE — Assessment & Plan Note (Signed)
Chest x-ray today May get basic serology and repeat CT in a few months

## 2016-11-27 NOTE — Patient Instructions (Addendum)
Refill on [facility] Ambulatory saturation to decide level of oxygen required Chest x-ray today Lung function is stable

## 2016-11-27 NOTE — Progress Notes (Signed)
Subjective:    Patient ID: Luke Oh., male    DOB: 1941/03/28, 76 y.o.   MRN: [id number]  HPI  76 y.o.Cardiac transplant recipient, ex-smoker  for FU of COPD and pulmonary hypertension .  PMH ischemic cardiomyopathy, status post heart transplant in 1996, on immunosuppressants,ascending aortic aneurysm followed at [facility], hypertension, hyperlipidemia, heparin-induced thrombocytopenia  He is a  76 PY ex smoker (quit '95)  He had 2 cardiac stents placed after left heart cath. After right heart cath, he was diagnosed with pulmonary hypertension-initially placed on sildenafil- this was changed to adcirca in 06/2015 , Did not tolerate Letairis due to pedal edema   Chief Complaint  Patient presents with  . Follow-up    FOLLOW UP FOR 6 month follow up O2 at home is 5 to 6 liters when he goes out it's on 3 or 4 liters patient states that he is not feeling well  Monday and Tuesday he just laid around both days he is more sob    He continues to be compliant with breo & Spiriva. He has a new transplant surgeon that he see his note a Music therapist. He is changes PCP  to let Exie Parody at [place]  I again reviewed his records from [place], recent echo showed normal LV function and no evidence of RV dysfunction. He continues to have hiccups did not respond to baclofen or chlorpromazine   Spirometry today shows FEV1 of 54%, FVC of 72% and ratio 55  Significant tests/ events  12/2013 Dyspnea attributed to [facility] Underwent extensive evaluation at [facility]  CT neg PE , VQ very low prob  RHC RA 2, PCWP 5, PA 44/21 ,mean 32, CI 1.7 --> given volume, With iNO improved CI from 1.6 to 2.3    Echo 03/2014 RVSP 61 (on sildenafil), nml LV fn, LVH  05/2015  Diffuse granular reticulation and subtle groundglass attenuation which may be slightly increased compared to 11/2014. Mild scattered interlobular septal thickening. These findings raise the possibility of interstitial lung disease, this  appearance would be inconsistent with UIP.  Spirometry 03/2012 fev 1 of 46%, FVC 68%, ratio 50  04/26/2012 ONO on 2.5 L O2 shows desatn for 3h 45 m (46% of night) - Increased to 3L O2  11/29/2012 Completed pulm rehab - FEV1 49%  PFTs 12/2013 - FVC 76%, FEV1 52% , ratio 53, DLCO 20%  PFTs 05/2015 FEV1 47% FVC 74%, TLC 80%, DLCO 14%  sputum culture 12/06/2015  Pseudomonas.   esophagram 12/14/2015 which showed ulceration in the middle third of the esophagus at the level of the left mainstem bronchus, nonspecific esophageal dysmotility, small hiatal hernia >>diflucan per GI   Review of Systems neg for any significant sore throat, dysphagia, itching, sneezing, nasal congestion or excess/ purulent secretions, fever, chills, sweats, unintended wt loss, pleuritic or exertional cp, hempoptysis, orthopnea pnd or change in chronic leg swelling. Also denies presyncope, palpitations, heartburn, abdominal pain, nausea, vomiting, diarrhea or change in bowel or urinary habits, dysuria,hematuria, rash, arthralgias, visual complaints, headache, numbness weakness or ataxia.     Objective:   Physical Exam   Gen. Pleasant, well-nourished, in no distress on 3L [place] at rest ENT - no thrush, no post nasal drip Neck: No JVD, no thyromegaly, no carotid bruits Lungs: no use of accessory muscles, no dullness to percussion, clear without rales or rhonchi  Cardiovascular: Rhythm regular, heart sounds  normal, no murmurs or gallops, no peripheral edema Musculoskeletal: No deformities, no cyanosis or clubbing  Assessment & Plan:

## 2016-11-27 NOTE — Assessment & Plan Note (Addendum)
Refill on [place], ct spiriva Ambulatory saturation to decide level of oxygen required  Lung function appears stable

## 2016-11-27 NOTE — Assessment & Plan Note (Signed)
Continue adcirca 20 twice a day

## 2016-12-09 ENCOUNTER — Telehealth: Payer: Self-pay | Admitting: Pulmonary Disease

## 2016-12-09 NOTE — Telephone Encounter (Signed)
Spoke with pt and informed him of his xray results per RA . Pt understood and had no further questions. Nothing further is needed.   Notes recorded by Rigoberto Noel, MD on 11/27/2016 at 4:39 PM EDT Mild BL fluid better than before

## 2016-12-22 NOTE — Progress Notes (Signed)
Spoke with patient and made him aware of results. Pt stated he had previously received results, but did not have any questions. He verbalized understanding. Nothing further is needed.

## 2017-01-19 ENCOUNTER — Telehealth: Payer: Self-pay | Admitting: Pulmonary Disease

## 2017-01-19 NOTE — Telephone Encounter (Signed)
Left message for patient to call back tomorrow.

## 2017-01-20 MED ORDER — TIOTROPIUM BROMIDE MONOHYDRATE 18 MCG IN CAPS: ORAL_CAPSULE | RESPIRATORY_TRACT | 5 refills | Status: DC

## 2017-01-20 NOTE — Telephone Encounter (Signed)
Spoke with patient. He is needing a RX for his Spiriva capsules to be sent to patient assistance. RX has been printed, signed, and faxed. Pt verbalized understanding. Nothing else needed at time of call.

## 2017-01-20 NOTE — Telephone Encounter (Signed)
LM for patient

## 2017-01-20 NOTE — Telephone Encounter (Signed)
Patient returning call - he can be reached at [phone] -pr  °

## 2017-01-27 ENCOUNTER — Encounter: Payer: Self-pay | Admitting: Family Medicine

## 2017-01-27 ENCOUNTER — Ambulatory Visit (INDEPENDENT_AMBULATORY_CARE_PROVIDER_SITE_OTHER): Payer: Medicare Other | Admitting: Family Medicine

## 2017-01-27 VITALS — BP 122/74 | HR 86 | Temp 97.8°F | Ht 67.0 in | Wt 112.4 lb

## 2017-01-27 DIAGNOSIS — R636 Underweight: Secondary | ICD-10-CM

## 2017-01-27 DIAGNOSIS — R5383 Other fatigue: Secondary | ICD-10-CM | POA: Diagnosis not present

## 2017-01-27 DIAGNOSIS — D7582 Heparin induced thrombocytopenia (HIT): Secondary | ICD-10-CM

## 2017-01-27 DIAGNOSIS — I272 Pulmonary hypertension, unspecified: Secondary | ICD-10-CM

## 2017-01-27 DIAGNOSIS — D75829 Heparin-induced thrombocytopenia, unspecified: Secondary | ICD-10-CM

## 2017-01-27 DIAGNOSIS — J9611 Chronic respiratory failure with hypoxia: Secondary | ICD-10-CM | POA: Diagnosis not present

## 2017-01-27 NOTE — Progress Notes (Signed)
Luke Jacobson. is a 76 y.o. male is here for follow up.  History of Present Illness:   Water quality scientist, CMA, acting as scribe for Dr. Juleen China.  HPI: Please see assessment and plan for problem based charting.  Health Maintenance Due  Topic Date Due  . TETANUS/TDAP  02/05/1961  . COLONOSCOPY  02/06/1992   Depression screen [facility] 2/9 01/27/2017 11/11/2016 11/11/2016  Decreased Interest 1 1 0  Down, Depressed, Hopeless _0 PHQ - 2 Score _1 Altered sleeping _2 Tired, decreased energy _3 Change in appetite _4 Feeling bad or failure about yourself  0 1 1  Trouble concentrating 3 0 0  Moving slowly or fidgety/restless 0 0 0  Suicidal thoughts 0 0 0  PHQ-9 Score _5 Difficult doing work/chores - Somewhat difficult -   PMHx, SurgHx, SocialHx, FamHx, Medications, and Allergies were reviewed in the Visit Navigator and updated as appropriate.   Patient Active Problem List   Diagnosis Date Noted  . Inguinal hernia 05/29/2016  . Bilateral inguinal hernia without obstruction or gangrene 04/21/2016  . Laryngopharyngeal reflux (LPR) 01/10/2016  . Arthritis of hand, left 12/26/2015  . Acute kidney injury superimposed on CKD ([place])   . Odynophagia 12/14/2015  . Pharyngoesophageal dysphagia 12/14/2015  . Regurgitation 12/13/2015  . Aspiration pneumonia ([place]) 12/13/2015  . CKD (chronic kidney disease), stage III 12/13/2015  . ILD (interstitial lung disease) ([place]) 09/18/2015  . Pulmonary hypertension ([place]) 12/21/2013  . Chronic respiratory failure ([place]) 03/10/2012  . CAD (coronary artery disease) 03/10/2012  . Heart replaced by transplant ([place]) 03/10/2012  . Diastolic CHF, acute on chronic ([place]) 03/10/2012  . COPD (chronic obstructive pulmonary disease) ([place]) 03/10/2012  . Hypertension   . History of tobacco use 09/08/2011  . Nephrolithiasis 09/08/2011  . Vasculopathy of cardiac allograft ([place]) 09/08/2011  . HIT (heparin-induced thrombocytopenia) ([place]) 09/08/2011    Social History  Substance Use Topics  . Smoking status: Former Smoker    Packs/day: 1.00    Years: 30.00    Types: Cigarettes    Quit date: 03/10/1994  . Smokeless tobacco: Never Used  . Alcohol use Yes     Comment: social   Current Medications and Allergies:   .  ADCIRCA 20 MG tablet, , Disp: , Rfl: 4 .  albuterol (PROAIR HFA) 108 (90 Base) MCG/ACT inhaler, Inhale 2 puffs into the lungs every 6 (six) hours as needed., Disp: 1 Inhaler, Rfl: 3 .  aspirin EC 81 MG tablet, Take 81 mg by mouth at bedtime. , Disp: , Rfl:  .  Coenzyme Q10 (CO Q10 PO), Take 500 mg by mouth daily., Disp: , Rfl:  .  cycloSPORINE modified (NEORAL) 25 MG capsule, Take 75 mg by mouth 2 (two) times daily. , Disp: , Rfl:  .  fexofenadine (ALLEGRA) 180 MG tablet, Take 180 mg by mouth daily., Disp: , Rfl:  .  fluconazole (DIFLUCAN) 100 MG tablet, Take 1 tablet (100 mg total) by mouth daily., Disp: 7 tablet, Rfl: 0 .  FLUoxetine (PROZAC) 20 MG tablet, Take 1 tablet (20 mg total) by mouth daily., Disp: 30 tablet, Rfl: 3 .  fluticasone furoate-vilanterol (BREO ELLIPTA) 100-25 MCG/INH AEPB, Inhale 1 puff into the lungs daily., Disp: 60 each, Rfl: 3 .  furosemide (LASIX) 20 MG tablet, Take 2 tablets (40 mg total) by mouth daily., Disp: 30 tablet, Rfl: 0 .  lactose free nutrition (BOOST) LIQD,  Take 237 mLs by mouth daily., Disp: , Rfl:  .  magnesium oxide (MAG-OX) 400 MG tablet, Take 400 mg by mouth 5 (five) times daily. , Disp: , Rfl:  .  Multiple Vitamin (MULTIVITAMIN) tablet, Take 1 tablet by mouth daily., Disp: , Rfl:  .  mycophenolate (CELLCEPT) 500 MG tablet, Take 1,500 mg by mouth 2 (two) times daily., Disp: , Rfl:  .  Omega-3 Fatty Acids (FISH OIL) 1000 MG CAPS, Take 1 capsule by mouth every evening., Disp: , Rfl:  .  omeprazole (PRILOSEC) 40 MG capsule, Take 40 mg by mouth daily., Disp: , Rfl:  .  simvastatin (ZOCOR) 40 MG tablet, Take 40 mg by mouth daily., Disp: , Rfl:  .  temazepam (RESTORIL) 15 MG capsule,  Take 30 mg by mouth at bedtime as needed. For sleep, Disp: , Rfl:  .  tiotropium (SPIRIVA HANDIHALER) 18 MCG inhalation capsule, INHALE THE CONTENTS OF 1 CAPSULE VIA HANDIHALER ONCE DAILY AS DIRECTED, Disp: 30 capsule, Rfl: 5  Allergies  Allergen Reactions  . Ambrisentan Other (See Comments), Itching and Swelling  . Heparin     Made white blood count go down. HIT   Review of Systems   Pertinent items are noted in the HPI. Otherwise, ROS is negative.  Vitals:   Vitals:   01/27/17 1554  BP: 122/74  Pulse: 86  Temp: 97.8 F (36.6 C)  TempSrc: Oral  SpO2: 96%  Weight: 112 lb 6.4 oz (51 kg)  Height: _0  (1.702 m)     Body mass index is 17.6 kg/m.   Physical Exam:   Physical Exam  Constitutional: He is oriented to person, place, and time. He appears well-developed and well-nourished. No distress.  HENT:  Head: Normocephalic and atraumatic.  Right Ear: External ear normal.  Left Ear: External ear normal.  Nose: Nose normal.  Mouth/Throat: Oropharynx is clear and moist.  Eyes: Pupils are equal, round, and reactive to light. Conjunctivae and EOM are normal.  Neck: Normal range of motion. Neck supple.  Cardiovascular: Normal rate, regular rhythm, normal heart sounds and intact distal pulses.   Pulmonary/Chest:  O2 [place].  Abdominal: Soft. Bowel sounds are normal.  Musculoskeletal: Normal range of motion.  Neurological: He is alert and oriented to person, place, and time.  Skin: Skin is warm and dry.  Psychiatric: He has a normal mood and affect. His behavior is normal. Judgment and thought content normal.  Nursing note and vitals reviewed.   Results for orders placed or performed in visit on 01/27/17  CBC with Differential/Platelet  Result Value Ref Range   WBC 5.0 4.0 - 10.5 K/uL   RBC 3.94 (L) 4.22 - 5.81 Mil/uL   Hemoglobin 12.3 (L) 13.0 - 17.0 g/dL   HCT 37.3 (L) 39.0 - 52.0 %   MCV 94.5 78.0 - 100.0 fl   MCHC 32.9 30.0 - 36.0 g/dL   RDW 15.0 11.5 - 15.5 %    Platelets 138.0 (L) 150.0 - 400.0 K/uL   Neutrophils Relative % 75.8 43.0 - 77.0 %   Lymphocytes Relative 10.0 (L) 12.0 - 46.0 %   Monocytes Relative 12.5 (H) 3.0 - 12.0 %   Eosinophils Relative 0.9 0.0 - 5.0 %   Basophils Relative 0.8 0.0 - 3.0 %   Neutro Abs 3.8 1.4 - 7.7 K/uL   Lymphs Abs 0.5 (L) 0.7 - 4.0 K/uL   Monocytes Absolute 0.6 0.1 - 1.0 K/uL   Eosinophils Absolute 0.0 0.0 - 0.7 K/uL   Basophils  Absolute 0.0 0.0 - 0.1 K/uL  Comprehensive metabolic panel  Result Value Ref Range   Sodium 135 135 - 145 mEq/L   Potassium 4.7 3.5 - 5.1 mEq/L   Chloride 98 96 - 112 mEq/L   CO2 28 19 - 32 mEq/L   Glucose, Bld 95 70 - 99 mg/dL   BUN 31 (H) 6 - 23 mg/dL   Creatinine, Ser 1.20 0.40 - 1.50 mg/dL   Total Bilirubin 0.8 0.2 - 1.2 mg/dL   Alkaline Phosphatase 90 39 - 117 U/L   AST 20 0 - 37 U/L   ALT 8 0 - 53 U/L   Total Protein 6.9 6.0 - 8.3 g/dL   Albumin 3.6 3.5 - 5.2 g/dL   Calcium 9.3 8.4 - 10.5 mg/dL   GFR 62.74 >60.00 mL/min  TSH  Result Value Ref Range   TSH 1.39 0.35 - 4.50 uIU/mL  Vitamin B12  Result Value Ref Range   Vitamin B-12 560 211 - 911 pg/mL   Assessment and Plan:   Tywan was seen today for follow-up.  Diagnoses and all orders for this visit:  Fatigue, unspecified type Comments: The patient feels that this is partly due to the Prozac that was recently prescribed. He describes hypersomnolence. He does not feel that it helps with depression or anxiety. He has tried to decrease his nighttime temazepam to see if that helps with sedation. It does help slightly, but has increased his insomnia. He denies any falls or focal weakness. He does endorse decreased appetite. In light of his history of dysthymia, insomnia, and malnutrition/low BMI, I recommend starting Remeron. He will wean off of the Prozac for the next 1-2 weeks and call when he is ready to start the Remeron. Orders: -     CBC with Differential/Platelet -     Comprehensive metabolic panel -     TSH -      Vitamin B12  HIT (heparin-induced thrombocytopenia) (HCC)  Chronic respiratory failure with hypoxia (HCC) Comments: On chronic O2 nasal cannula. Patient denies any chest pain or shortness of breath. No edema.  Pulmonary hypertension ([place])  Patient underweight Comments: Patient was encouraged to continue with his Boost supplements. We are weaning him from Prozac to start Remeron.   . Reviewed expectations re: course of current medical issues. . Discussed self-management of symptoms. . Outlined signs and symptoms indicating need for more acute intervention. . Patient verbalized understanding and all questions were answered. Marland Kitchen Health Maintenance issues including appropriate healthy diet, exercise, and smoking avoidance were discussed with patient. . See orders for this visit as documented in the electronic medical record. . Patient received an After Visit Summary.  CMA served as Education administrator during this visit. History, Physical, and Plan performed by medical provider. The above documentation has been reviewed and is accurate and complete. Briscoe Deutscher, D.O.  Briscoe Deutscher, DO [place] [place] 01/31/2017  Future Appointments Date Time Provider [place]  03/02/2017 12:00 PM Parrett, Fonnie Mu, NP [facility] None

## 2017-01-27 NOTE — Patient Instructions (Signed)
Wean off Prozac.  Take 1/2 tablet for 1 week, then stop.  Call if you feel like you are ready to start Remeron.

## 2017-01-28 LAB — CBC WITH DIFFERENTIAL/PLATELET
Basophils Absolute: 0 10*3/uL (ref 0.0–0.1)
Basophils Relative: 0.8 % (ref 0.0–3.0)
Eosinophils Absolute: 0 10*3/uL (ref 0.0–0.7)
Eosinophils Relative: 0.9 % (ref 0.0–5.0)
HCT: 37.3 % — ABNORMAL LOW (ref 39.0–52.0)
Hemoglobin: 12.3 g/dL — ABNORMAL LOW (ref 13.0–17.0)
Lymphocytes Relative: 10 % — ABNORMAL LOW (ref 12.0–46.0)
Lymphs Abs: 0.5 10*3/uL — ABNORMAL LOW (ref 0.7–4.0)
MCHC: 32.9 g/dL (ref 30.0–36.0)
MCV: 94.5 fl (ref 78.0–100.0)
Monocytes Absolute: 0.6 10*3/uL (ref 0.1–1.0)
Monocytes Relative: 12.5 % — ABNORMAL HIGH (ref 3.0–12.0)
Neutro Abs: 3.8 10*3/uL (ref 1.4–7.7)
Neutrophils Relative %: 75.8 % (ref 43.0–77.0)
Platelets: 138 10*3/uL — ABNORMAL LOW (ref 150.0–400.0)
RBC: 3.94 Mil/uL — ABNORMAL LOW (ref 4.22–5.81)
RDW: 15 % (ref 11.5–15.5)
WBC: 5 10*3/uL (ref 4.0–10.5)

## 2017-01-28 LAB — COMPREHENSIVE METABOLIC PANEL
ALT: 8 U/L (ref 0–53)
AST: 20 U/L (ref 0–37)
Albumin: 3.6 g/dL (ref 3.5–5.2)
Alkaline Phosphatase: 90 U/L (ref 39–117)
BUN: 31 mg/dL — ABNORMAL HIGH (ref 6–23)
CO2: 28 mEq/L (ref 19–32)
Calcium: 9.3 mg/dL (ref 8.4–10.5)
Chloride: 98 mEq/L (ref 96–112)
Creatinine, Ser: 1.2 mg/dL (ref 0.40–1.50)
GFR: 62.74 mL/min (ref >60.00)
Glucose, Bld: 95 mg/dL (ref 70–99)
Potassium: 4.7 mEq/L (ref 3.5–5.1)
Sodium: 135 mEq/L (ref 135–145)
Total Bilirubin: 0.8 mg/dL (ref 0.2–1.2)
Total Protein: 6.9 g/dL (ref 6.0–8.3)

## 2017-01-28 LAB — VITAMIN B12: Vitamin B-12: 560 pg/mL (ref 211–911)

## 2017-01-28 LAB — TSH: TSH: 1.39 u[IU]/mL (ref 0.35–4.50)

## 2017-02-07 ENCOUNTER — Encounter: Payer: Self-pay | Admitting: Family Medicine

## 2017-02-26 ENCOUNTER — Telehealth: Payer: Self-pay | Admitting: Family Medicine

## 2017-02-26 NOTE — Telephone Encounter (Signed)
Patient called in reference to My chart e-mail sent. Patient also wanted to know about replacing Rx (patient did not know name of Rx). Please call patient and advise.

## 2017-03-01 NOTE — Telephone Encounter (Signed)
Left message for patient to return call.

## 2017-03-02 ENCOUNTER — Encounter: Payer: Self-pay | Admitting: Adult Health

## 2017-03-02 ENCOUNTER — Ambulatory Visit (INDEPENDENT_AMBULATORY_CARE_PROVIDER_SITE_OTHER): Payer: Medicare Other | Admitting: Adult Health

## 2017-03-02 VITALS — BP 118/78 | HR 79 | Ht 67.0 in | Wt 112.0 lb

## 2017-03-02 DIAGNOSIS — I272 Pulmonary hypertension, unspecified: Secondary | ICD-10-CM

## 2017-03-02 DIAGNOSIS — J449 Chronic obstructive pulmonary disease, unspecified: Secondary | ICD-10-CM | POA: Diagnosis not present

## 2017-03-02 DIAGNOSIS — Z23 Encounter for immunization: Secondary | ICD-10-CM | POA: Diagnosis not present

## 2017-03-02 DIAGNOSIS — J9611 Chronic respiratory failure with hypoxia: Secondary | ICD-10-CM | POA: Diagnosis not present

## 2017-03-02 MED ORDER — FLUTICASONE FUROATE-VILANTEROL 100-25 MCG/INH IN AEPB
1.0000 | INHALATION_SPRAY | Freq: Every day | RESPIRATORY_TRACT | 6 refills | Status: DC
Start: 2017-03-02 — End: 2017-03-11

## 2017-03-02 NOTE — Progress Notes (Signed)
_0  ID: Luke Jacobson., male    DOB: 19-Jun-1941, 76 y.o.   MRN: [id number]  Chief Complaint  Patient presents with  . Follow-up    COPD     Referring provider: Briscoe Deutscher, DO  HPI: PCP - [place]  Cards - [facility] , [facility], transplant  PH - [facility] test  76y.o. 30 PY ex smoker (quit '95) presents for FU of COPD.  PMH ischemic cardiomyopathy, status post heart transplant in 1996, on immunosuppressants,ascending aortic aneurysm followed at [facility], hypertension, hyperlipidemia, heparin-induced thrombocytopenia    He had 2 cardiac stents placed after left heart cath. After right heart cath, he was diagnosed with pulmonary hypertension-initially placed on sildenafil- this was changed to the letairis and adcirca in 06/2015 [name] test)  TEST Joya San  [facility] eval  CT neg PE , VQ very low prob  RHC RA 2, PCWP 5, PA 44/21 ,mean 32, CI 1.7 --> given volume, With iNO improved CI from 1.6 to 2.3  Echo 03/2014 RVSP 61 (on sildenafil), nml LV fn, LVH 05/2015 CT chest is very atypical. He has emphysema with apical and upper lobe predominence. He has increased reticular formation in the lungs which is not typical UIP but is most consistent with some degree of ILD Spirometry 03/2012 fev 1 of 46%, FVC 68%, ratio 50  04/26/2012 ONO on 2.5 L O2 shows desatn for 3h 62 m (46% of night) - Increased to 3L O2  11/29/2012 Completed pulm rehab - FEV1 49%  PFTs 12/2013 - FVC 76%, FEV1 52% , ratio 53, DLCO 20%  HRCT Chest >mod emphysema, ILD /NSIP changes (patchy areas of septal thickening, few subpleural reticulation)    03/02/2017 Follow up : COPD /ILD , O2 RF , Pulmonary HTN , Heart Transplant .  Pt returns for a 3 month follow up. He has known COPD and emphysema. He is on Spiriva and BREO. Doesn't his breathing is at baseline. He denies any flare cough or wheezing. Previous CT chest showed some mild ILD changes and SI PE, possibly. PVX and Prevnar utd.  Is oxygen dependent respiratory  failure. He is on 3l/m at rest and 6l/m with act .   He has pulmonary hypertension followed at [facility]. ..   He follows with the transplant team at [facility]  . Ambrisentan Other (See Comments), Itching and Swelling  . Heparin     Made white blood count go down. HIT    Immunization History  Administered Date(s) Administered  . Influenza Split 04/13/2011, 03/29/2012  . Influenza, High Dose Seasonal PF 04/16/2015, 04/21/2016  . Influenza,inj,Quad PF,6+ Mos 04/04/2013, 04/05/2014  . Influenza-Unspecified 05/23/2015, 04/27/2016  . Pneumococcal Conjugate-13 05/28/2016  . Pneumococcal Polysaccharide-23 07/13/2010  . Tdap 03/02/2017    Past Medical History:  Diagnosis Date  . AAA (abdominal aortic aneurysm) ([place])   . Arrhythmia has had episodes of fast heart rate  . Arthritis   . Asthma   . Blood transfusion without reported diagnosis   . COPD (chronic obstructive pulmonary disease) ([place])   . Depression   . HIT (heparin-induced thrombocytopenia) ([place])   . Hypercholesteremia   . Hypertension   . Shortness of breath     Tobacco History: History  Smoking Status  . Former Smoker  . Packs/day: 1.00  . Years: 30.00  . Types: Cigarettes  . Quit date: 03/10/1994  Smokeless Tobacco  . Never Used   Counseling given: Not Answered   Outpatient Encounter Prescriptions as  of 03/02/2017  Medication Sig  . ADCIRCA 20 MG tablet   . albuterol (PROAIR HFA) 108 (90 Base) MCG/ACT inhaler Inhale 2 puffs into the lungs every 6 (six) hours as needed.  Marland Kitchen aspirin EC 81 MG tablet Take 81 mg by mouth at bedtime.   . Coenzyme Q10 (CO Q10 PO) Take 500 mg by mouth daily.  . cycloSPORINE modified (NEORAL) 25 MG capsule Take 75 mg by mouth 2 (two) times daily.   . fexofenadine (ALLEGRA) 180 MG tablet Take 180 mg by mouth daily.  . fluticasone furoate-vilanterol (BREO ELLIPTA) 100-25 MCG/INH AEPB Inhale 1 puff into the lungs daily.  . furosemide  (LASIX) 20 MG tablet Take 2 tablets (40 mg total) by mouth daily.  Marland Kitchen lactose free nutrition (BOOST) LIQD Take 237 mLs by mouth daily.  . magnesium oxide (MAG-OX) 400 MG tablet Take 400 mg by mouth 5 (five) times daily.   . Multiple Vitamin (MULTIVITAMIN) tablet Take 1 tablet by mouth daily.  . mycophenolate (CELLCEPT) 500 MG tablet Take 1,500 mg by mouth 2 (two) times daily.  . Omega-3 Fatty Acids (FISH OIL) 1000 MG CAPS Take 1 capsule by mouth every evening.  Marland Kitchen omeprazole (PRILOSEC) 40 MG capsule Take 40 mg by mouth daily.  . simvastatin (ZOCOR) 40 MG tablet Take 40 mg by mouth daily.  . temazepam (RESTORIL) 15 MG capsule Take 30 mg by mouth at bedtime as needed. For sleep  . tiotropium (SPIRIVA HANDIHALER) 18 MCG inhalation capsule INHALE THE CONTENTS OF 1 CAPSULE VIA HANDIHALER ONCE DAILY AS DIRECTED  . [DISCONTINUED] fluticasone furoate-vilanterol (BREO ELLIPTA) 100-25 MCG/INH AEPB Inhale 1 puff into the lungs daily.  . [DISCONTINUED] fluconazole (DIFLUCAN) 100 MG tablet Take 1 tablet (100 mg total) by mouth daily.  . [DISCONTINUED] FLUoxetine (PROZAC) 20 MG tablet Take 1 tablet (20 mg total) by mouth daily.   No facility-administered encounter medications on file as of 03/02/2017.      Review of Systems  Constitutional:   No  weight loss, night sweats,  Fevers, chills +, fatigue, or  lassitude.  HEENT:   No headaches,  Difficulty swallowing,  Tooth/dental problems, or  Sore throat,                No sneezing, itching, ear ache, nasal congestion, post nasal drip,   CV:  No chest pain,  Orthopnea, PND,  , anasarca, dizziness, palpitations, syncope.   GI  No heartburn, indigestion, abdominal pain, nausea, vomiting, diarrhea, change in bowel habits, loss of appetite, bloody stools.   Resp: No shortness of breath with exertion or at rest.  No excess mucus, no productive cough,  No non-productive cough,  No coughing up of blood.  No change in color of mucus.  No wheezing.  No chest wall  deformity  Skin: no rash or lesions.  GU: no dysuria, change in color of urine, no urgency or frequency.  No flank pain, no hematuria   MS:  No joint pain or swelling.  No decreased range of motion.  No back pain.    Physical Exam  BP 118/78 (BP Location: Left Arm, Patient Position: Sitting, Cuff Size: Normal)   Pulse 79   Ht _0  (1.702 m)   Wt 112 lb (50.8 kg)   SpO2 93%   BMI 17.54 kg/m   GEN: A/Ox3; pleasant , NAD, elderly , thin on o2    HEENT:  [place]/AT,  EACs-clear, TMs-wnl, NOSE-clear, THROAT-clear, no lesions, no postnasal drip or exudate noted.  NECK:  Supple w/ fair ROM; no JVD; normal carotid impulses w/o bruits; no thyromegaly or nodules palpated; no lymphadenopathy.    RESP  Clear  P & A; w/o, wheezes/ rales/ or rhonchi. no accessory muscle use, no dullness to percussion  CARD:  RRR, no m/r/g, 1+ peripheral edema, pulses intact, no cyanosis or clubbing.  GI:   Soft & nt; nml bowel sounds; no organomegaly or masses detected.   Musco: Warm bil, no deformities or joint swelling noted.   Neuro: alert, no focal deficits noted.    Skin: Warm, no lesions or rashes    Lab Results:  CBC    Imaging: No results found.   Assessment & Plan:   Chronic respiratory failure (HCC) Cont on o2 , keep O2 sats >88-90%.   COPD (chronic obstructive pulmonary disease) (HCC) Stable cont on current regimen      Rexene Edison, NP 03/02/2017

## 2017-03-02 NOTE — Assessment & Plan Note (Signed)
Stable cont on current regimen

## 2017-03-02 NOTE — Assessment & Plan Note (Signed)
Cont on o2 , keep O2 sats >88-90%.

## 2017-03-02 NOTE — Patient Instructions (Signed)
TDAP today .  Continue on BREO and Spiriva daily . Rinse after use.  Continue on Oxygen  3l/m rest and 6l/m with activity .  Low slat diet , legs elevated.  Follow up with [facility] as discuseed  Follow up Dr. Elsworth Soho in 4 months and As needed

## 2017-03-05 NOTE — Telephone Encounter (Signed)
Responded to patient's MyChart message.

## 2017-03-10 ENCOUNTER — Telehealth: Payer: Self-pay | Admitting: Pulmonary Disease

## 2017-03-10 NOTE — Telephone Encounter (Signed)
lmtcb x1 for pt.

## 2017-03-11 ENCOUNTER — Telehealth: Payer: Self-pay | Admitting: Pulmonary Disease

## 2017-03-11 MED ORDER — ADCIRCA 20 MG PO TABS: 40.0000 mg | ORAL_TABLET | Freq: Every day | ORAL | 11 refills | Status: DC

## 2017-03-11 MED ORDER — FLUTICASONE FUROATE-VILANTEROL 100-25 MCG/INH IN AEPB
1.0000 | INHALATION_SPRAY | Freq: Every day | RESPIRATORY_TRACT | 11 refills | Status: DC
Start: 2017-03-11 — End: 2017-03-16

## 2017-03-11 NOTE — Telephone Encounter (Signed)
Patient returning call - he can be reached at [phone] -pr

## 2017-03-11 NOTE — Telephone Encounter (Signed)
LM x 2

## 2017-03-11 NOTE — Telephone Encounter (Signed)
rx sent to pharmacy.  Pt aware.  Nothing further needed.

## 2017-03-11 NOTE — Telephone Encounter (Signed)
rx was sent to local pharmacy instead of mail order pharmacy.  This has been re-sent to preferred mail order pharmacy.  Nothing further needed

## 2017-03-11 NOTE — Telephone Encounter (Signed)
OK to refill based on last OV

## 2017-03-11 NOTE — Telephone Encounter (Signed)
Called and spoke with pt. Pt states he was told by [facility] that an appointment was needed for Adcirca could be refilled. Pt had OV with TP on 03/02/17. I have spoken with Terri with [facility], who states [facility] received refused Rx for Adcirca. Rx stated that pt needed an apt before Rx could be refilled. Pt had OV with TP on 03/02/17.   RX please advise if okay to refill Adcirca. Thanks.

## 2017-03-12 ENCOUNTER — Telehealth: Payer: Self-pay | Admitting: Pulmonary Disease

## 2017-03-12 NOTE — Telephone Encounter (Signed)
Left message for patient to call back.

## 2017-03-16 MED ORDER — FLUTICASONE FUROATE-VILANTEROL 100-25 MCG/INH IN AEPB
1.0000 | INHALATION_SPRAY | Freq: Every day | RESPIRATORY_TRACT | 1 refills | Status: DC
Start: 2017-03-16 — End: 2017-04-06

## 2017-03-16 NOTE — Telephone Encounter (Signed)
Spoke with pt. He is needing a refill on Breo sent to [facility]. This has been taken care of. Nothing further was needed.

## 2017-03-16 NOTE — Telephone Encounter (Signed)
lmomtcb x 2---we need to know what prescription he is referring to and needs to be refilled.

## 2017-03-16 NOTE — Telephone Encounter (Signed)
Patient stated he need Breo 100-25 MCG filled.

## 2017-03-19 ENCOUNTER — Telehealth: Payer: Self-pay | Admitting: Pulmonary Disease

## 2017-03-19 NOTE — Telephone Encounter (Signed)
Called and spoke with the pharmacy and they stated that they do have the 2 rx that were sent over on the breo---they stated that the one for a 30 day supply they will get rid of.  They stated that the pt has to call to let them know that he needs this rx but it cannot be refilled until 9/22.  The pharmacist gave me the direct number for the pt to call and I have given this to him.  He is aware and nothing further is needed.

## 2017-03-29 ENCOUNTER — Ambulatory Visit (INDEPENDENT_AMBULATORY_CARE_PROVIDER_SITE_OTHER): Payer: No Typology Code available for payment source | Admitting: Psychology

## 2017-03-29 DIAGNOSIS — F321 Major depressive disorder, single episode, moderate: Secondary | ICD-10-CM | POA: Diagnosis not present

## 2017-04-05 ENCOUNTER — Telehealth: Payer: Self-pay | Admitting: Pulmonary Disease

## 2017-04-05 NOTE — Telephone Encounter (Signed)
Patient stated he forgot to mention RX need to be 90 supply for 3 refills. [place])

## 2017-04-05 NOTE — Telephone Encounter (Signed)
lmomtcb x 1 for the pt to call back about rx.

## 2017-04-06 MED ORDER — FLUTICASONE FUROATE-VILANTEROL 100-25 MCG/INH IN AEPB: 1.0000 | INHALATION_SPRAY | Freq: Every day | RESPIRATORY_TRACT | 3 refills | Status: AC

## 2017-04-06 NOTE — Telephone Encounter (Signed)
Spoke with pt, the Rx was not written for 90 days with 3 refills. I will re-fax medication to patient assistance program at the fax number listed. Nothing further is needed.

## 2017-04-06 NOTE — Telephone Encounter (Signed)
lmtcb x2 for pt. 

## 2017-07-02 ENCOUNTER — Encounter: Payer: Self-pay | Admitting: Adult Health

## 2017-07-02 ENCOUNTER — Telehealth: Payer: Self-pay | Admitting: Pulmonary Disease

## 2017-07-02 ENCOUNTER — Ambulatory Visit: Payer: Medicare Other | Admitting: Adult Health

## 2017-07-02 ENCOUNTER — Telehealth: Payer: Self-pay

## 2017-07-02 DIAGNOSIS — J9611 Chronic respiratory failure with hypoxia: Secondary | ICD-10-CM

## 2017-07-02 DIAGNOSIS — J849 Interstitial pulmonary disease, unspecified: Secondary | ICD-10-CM | POA: Diagnosis not present

## 2017-07-02 DIAGNOSIS — J449 Chronic obstructive pulmonary disease, unspecified: Secondary | ICD-10-CM

## 2017-07-02 DIAGNOSIS — I272 Pulmonary hypertension, unspecified: Secondary | ICD-10-CM

## 2017-07-02 MED ORDER — TADALAFIL (PAH) 20 MG PO TABS: 40.0000 mg | ORAL_TABLET | Freq: Every day | ORAL | 11 refills | Status: DC

## 2017-07-02 NOTE — Progress Notes (Signed)
_0  ID: Luke Jacobson., male    DOB: Aug 29, 1940, 76 y.o.   MRN: [id number]  Chief Complaint  Patient presents with  . Follow-up    COPD    Referring provider: Briscoe Deutscher, DO  HPI:  76y.o. 11 PY ex smoker (quit '95) presents for FU of COPD.  PMH ischemic cardiomyopathy, status post heart transplant in 1996, on immunosuppressants,ascending aortic aneurysm followed at [facility], hypertension, hyperlipidemia, heparin-induced thrombocytopenia   He had 2 cardiac stents placed after left heart cath. After right heart cath, he was diagnosed with pulmonary hypertension-initially placed on sildenafil- this was changed to the letairis and adcirca in 06/2015 [name] test)  TEST Joya San  [facility] eval  CT neg PE , VQ very low prob  RHC RA 2, PCWP 5, PA 44/21 ,mean 32, CI 1.7 -->given volume, With iNO improved CI from 1.6 to 2.3  Echo 03/2014 RVSP 61 (on sildenafil), nml LV fn, LVH 05/2015 CT chest is very atypical. He has emphysema with apical and upper lobe predominence. He has increased reticular formation in the lungs which is not typical UIP but is most consistent with some degree of ILD Spirometry 03/2012 fev 1 of 46%, FVC 68%, ratio 50  04/26/2012 ONO on 2.5 L O2 shows desatn for 3h 29 m (46% of night) - Increased to 3L O2  11/29/2012 Completed pulm rehab - FEV1 49%  PFTs 12/2013 - FVC 76%, FEV1 52% , ratio 53, DLCO 20%  05/2016 HRCT Chest >mod emphysema, ILD /NSIP changes (patchy areas of septal thickening, few subpleural reticulation)    07/02/2017 Follow up: COPD/ILD , O2 RF , Pulmonary HTN , Heart Transplant  Pt returns for 4 month follow up . Says overall he is doing about the same . Gets winded with activity . No flare of cough or wheezing .  Remains on BREO and Spiriva . Gets patient assistance for these drugs. Paperwork completed today .   Has Pulmoanry HTN . [facility] in the past. Is on Adcirca . He needs helps with grant coverage. His current will run out  in January . He wants this followed locally .   Is on Oxygen 3l/m  Rest and 5-6 l/m walking  Says he has had loose stools for last 2 week. No n/v. No bloody stools , back pain , urinary sx  Or fever.  Advised on diet and fluid intake .    Allergies  Allergen Reactions  . Ambrisentan Other (See Comments), Itching and Swelling  . Heparin     Made white blood count go down. HIT    Immunization History  Administered Date(s) Administered  . Influenza Split 04/13/2011, 03/29/2012  . Influenza, High Dose Seasonal PF 04/16/2015, 04/21/2016, 04/12/2017  . Influenza,inj,Quad PF,6+ Mos 04/04/2013, 04/05/2014  . Influenza-Unspecified 05/23/2015, 04/27/2016  . Pneumococcal Conjugate-13 05/28/2016  . Pneumococcal Polysaccharide-23 07/13/2010  . Tdap 03/02/2017    Past Medical History:  Diagnosis Date  . AAA (abdominal aortic aneurysm) ([place])   . Arrhythmia has had episodes of fast heart rate  . Arthritis   . Asthma   . Blood transfusion without reported diagnosis   . COPD (chronic obstructive pulmonary disease) ([place])   . Depression   . HIT (heparin-induced thrombocytopenia) ([place])   . Hypercholesteremia   . Hypertension   . Shortness of breath     Tobacco History: Social History   Tobacco Use  Smoking Status Former Smoker  . Packs/day: 1.00  . Years: 30.00  . Pack years: 30.00  .  Types: Cigarettes  . Last attempt to quit: 03/10/1994  . Years since quitting: 23.3  Smokeless Tobacco Never Used   Counseling given: Not Answered   Outpatient Encounter Medications as of 07/02/2017  Medication Sig  . albuterol (PROAIR HFA) 108 (90 Base) MCG/ACT inhaler Inhale 2 puffs into the lungs every 6 (six) hours as needed.  Marland Kitchen aspirin EC 81 MG tablet Take 81 mg by mouth at bedtime.   . Coenzyme Q10 (CO Q10 PO) Take 500 mg by mouth daily.  . cycloSPORINE modified (NEORAL) 25 MG capsule Take 75 mg by mouth 2 (two) times daily.   . fexofenadine (ALLEGRA) 180 MG tablet Take 180 mg by mouth  daily.  . fluticasone furoate-vilanterol (BREO ELLIPTA) 100-25 MCG/INH AEPB Inhale 1 puff into the lungs daily.  . furosemide (LASIX) 20 MG tablet Take 2 tablets (40 mg total) by mouth daily.  Marland Kitchen lactose free nutrition (BOOST) LIQD Take 237 mLs by mouth daily.  . magnesium oxide (MAG-OX) 400 MG tablet Take 400 mg by mouth 5 (five) times daily.   . Multiple Vitamin (MULTIVITAMIN) tablet Take 1 tablet by mouth daily.  . mycophenolate (CELLCEPT) 500 MG tablet Take 1,500 mg by mouth 2 (two) times daily.  . Omega-3 Fatty Acids (FISH OIL) 1000 MG CAPS Take 1 capsule by mouth every evening.  Marland Kitchen omeprazole (PRILOSEC) 40 MG capsule Take 40 mg by mouth daily.  . simvastatin (ZOCOR) 40 MG tablet Take 40 mg by mouth daily.  . temazepam (RESTORIL) 15 MG capsule Take 30 mg by mouth at bedtime as needed. For sleep  . tiotropium (SPIRIVA HANDIHALER) 18 MCG inhalation capsule INHALE THE CONTENTS OF 1 CAPSULE VIA HANDIHALER ONCE DAILY AS DIRECTED  . [DISCONTINUED] ADCIRCA 20 MG tablet Take 2 tablets (40 mg total) by mouth daily.   No facility-administered encounter medications on file as of 07/02/2017.      Review of Systems  Constitutional:   No  weight loss, night sweats,  Fevers, chills, + fatigue, or  lassitude.  HEENT:   No headaches,  Difficulty swallowing,  Tooth/dental problems, or  Sore throat,                No sneezing, itching, ear ache, nasal congestion, post nasal drip,   CV:  No chest pain,  Orthopnea, PND, swelling in lower extremities, anasarca, dizziness, palpitations, syncope.   GI  No heartburn, indigestion, abdominal pain, nausea, vomiting,  Resp:    No chest wall deformity  Skin: no rash or lesions.  GU: no dysuria, change in color of urine, no urgency or frequency.  No flank pain, no hematuria   MS:  No joint pain or swelling.  No decreased range of motion.  No back pain.    Physical Exam  BP 124/66 (BP Location: Right Arm, Cuff Size: Normal)   Pulse 83   Ht _0  (1.702  m)   Wt 110 lb 3.2 oz (50 kg)   SpO2 92%   BMI 17.26 kg/m   GEN: A/Ox3; pleasant , NAD, elderly , on o2    HEENT:  [place]/AT,  EACs-clear, TMs-wnl, NOSE-clear, THROAT-clear, no lesions, no postnasal drip or exudate noted.   NECK:  Supple w/ fair ROM; no JVD; normal carotid impulses w/o bruits; no thyromegaly or nodules palpated; no lymphadenopathy.    RESP  BB crackles ,  no accessory muscle use, no dullness to percussion  CARD:  RRR, no m/r/g, tr to none  peripheral edema, pulses intact, no cyanosis or clubbing.  GI:   Soft & nt; nml bowel sounds; no organomegaly or masses detected.   Musco: Warm bil, no deformities or joint swelling noted.   Neuro: alert, no focal deficits noted.    Skin: Warm, no lesions or rashes    Lab Results:  CBC    Component Value Date/Time   WBC 5.0 01/27/2017 1632   RBC 3.94 (L) 01/27/2017 1632   HGB 12.3 (L) 01/27/2017 1632   HCT 37.3 (L) 01/27/2017 1632   PLT 138.0 (L) 01/27/2017 1632   MCV 94.5 01/27/2017 1632   MCH 29.2 12/18/2015 0512   MCHC 32.9 01/27/2017 1632   RDW 15.0 01/27/2017 1632   LYMPHSABS 0.5 (L) 01/27/2017 1632   MONOABS 0.6 01/27/2017 1632   EOSABS 0.0 01/27/2017 1632   BASOSABS 0.0 01/27/2017 1632    BMET    Component Value Date/Time   NA 135 01/27/2017 1632   K 4.7 01/27/2017 1632   CL 98 01/27/2017 1632   CO2 28 01/27/2017 1632   GLUCOSE 95 01/27/2017 1632   BUN 31 (H) 01/27/2017 1632   CREATININE 1.20 01/27/2017 1632   CREATININE 1.26 (H) 02/28/2015 0001   CALCIUM 9.3 01/27/2017 1632   GFRNONAA 39 (L) 12/18/2015 0512   GFRAA 46 (L) 12/18/2015 0512    BNP    Component Value Date/Time   BNP 229.8 (H) 12/18/2015 0512   BNP 178.8 (H) 02/28/2015 0001    ProBNP    Component Value Date/Time   PROBNP 492.2 (H) 03/10/2012 1400    Imaging: No results found.   Assessment & Plan:   COPD (chronic obstructive pulmonary disease) ([place]) Compensated without flare   Plan  Patient Instructions  Continue  on BREO and Spiriva daily . Rinse after use.  Continue on Oxygen  3l/m rest and 6l/m with activity .  Low salt diet , legs elevated.  If Diarrhea is not improving or resolves will need to see Primary MD.  Joanie Coddington paperwork started.  Follow up Dr. Elsworth Soho in 4 months and As needed        ILD (interstitial lung disease) ([place]) Stable   Cont to follow   Chronic respiratory failure ([place]) Cont on O2   Pulmonary hypertension ([place]) Cont on Adcirca  Pt assistance papers completed.  Plan  Patient Instructions  Continue on BREO and Spiriva daily . Rinse after use.  Continue on Oxygen  3l/m rest and 6l/m with activity .  Low salt diet , legs elevated.  If Diarrhea is not improving or resolves will need to see Primary MD.  Joanie Coddington paperwork started.  Follow up Dr. Elsworth Soho in 4 months and As needed           Rexene Edison, NP 07/02/2017

## 2017-07-02 NOTE — Assessment & Plan Note (Signed)
Stable   Cont to follow

## 2017-07-02 NOTE — Telephone Encounter (Signed)
From has been faxed to [employer] for Tadalafil.  Will route to JJ to f/u on.

## 2017-07-02 NOTE — Telephone Encounter (Signed)
Patient just seen in the office today by TP for follow up Pt mentioned refills on his Tadalafil  Called [facility], spoke with pharmacist Cloyde Reams Asked Cloyde Reams about pt's cost - she reported about a $40 difference between generic and brand Verbal authorization given for Tadalafil 103m 2 tabs by mouth once daily as last filled by RA >> #60 with 11 refills Med list updated  Will begin patient assistance for patient for this as well Forwarding back to myself for follow up on this

## 2017-07-02 NOTE — Assessment & Plan Note (Signed)
Cont on Adcirca  Pt assistance papers completed.  Plan  Patient Instructions  Continue on BREO and Spiriva daily . Rinse after use.  Continue on Oxygen  3l/m rest and 6l/m with activity .  Low salt diet , legs elevated.  If Diarrhea is not improving or resolves will need to see Primary MD.  Joanie Coddington paperwork started.  Follow up Dr. Elsworth Soho in 4 months and As needed

## 2017-07-02 NOTE — Assessment & Plan Note (Signed)
Compensated without flare   Plan  Patient Instructions  Continue on BREO and Spiriva daily . Rinse after use.  Continue on Oxygen  3l/m rest and 6l/m with activity .  Low salt diet , legs elevated.  If Diarrhea is not improving or resolves will need to see Primary MD.  Luke Jacobson paperwork started.  Follow up Dr. Elsworth Soho in 4 months and As needed

## 2017-07-02 NOTE — Assessment & Plan Note (Signed)
Cont on O2

## 2017-07-02 NOTE — Patient Instructions (Signed)
Continue on BREO and Spiriva daily . Rinse after use.  Continue on Oxygen  3l/m rest and 6l/m with activity .  Low salt diet , legs elevated.  If Diarrhea is not improving or resolves will need to see Primary MD.  Joanie Coddington paperwork started.  Follow up Dr. Elsworth Soho in 4 months and As needed

## 2017-07-07 ENCOUNTER — Ambulatory Visit: Payer: Medicare Other | Admitting: Family Medicine

## 2017-07-07 ENCOUNTER — Encounter: Payer: Self-pay | Admitting: Family Medicine

## 2017-07-07 VITALS — BP 104/68 | HR 86 | Temp 98.1°F | Ht 67.0 in | Wt 113.2 lb

## 2017-07-07 DIAGNOSIS — R1084 Generalized abdominal pain: Secondary | ICD-10-CM

## 2017-07-07 DIAGNOSIS — R197 Diarrhea, unspecified: Secondary | ICD-10-CM

## 2017-07-07 DIAGNOSIS — R195 Other fecal abnormalities: Secondary | ICD-10-CM

## 2017-07-07 LAB — CBC WITH DIFFERENTIAL/PLATELET
Basophils Absolute: 0 10*3/uL (ref 0.0–0.1)
Basophils Relative: 0.3 % (ref 0.0–3.0)
Eosinophils Absolute: 0 10*3/uL (ref 0.0–0.7)
Eosinophils Relative: 0.8 % (ref 0.0–5.0)
HCT: 39.2 % (ref 39.0–52.0)
Hemoglobin: 12.3 g/dL — ABNORMAL LOW (ref 13.0–17.0)
Lymphocytes Relative: 11.2 % — ABNORMAL LOW (ref 12.0–46.0)
Lymphs Abs: 0.5 10*3/uL — ABNORMAL LOW (ref 0.7–4.0)
MCHC: 31.5 g/dL (ref 30.0–36.0)
MCV: 97.2 fl (ref 78.0–100.0)
Monocytes Absolute: 0.5 10*3/uL (ref 0.1–1.0)
Monocytes Relative: 11.8 % (ref 3.0–12.0)
Neutro Abs: 3.3 10*3/uL (ref 1.4–7.7)
Neutrophils Relative %: 75.9 % (ref 43.0–77.0)
Platelets: 149 10*3/uL — ABNORMAL LOW (ref 150.0–400.0)
RBC: 4.03 Mil/uL — ABNORMAL LOW (ref 4.22–5.81)
RDW: 14.7 % (ref 11.5–15.5)
WBC: 4.4 10*3/uL (ref 4.0–10.5)

## 2017-07-07 LAB — COMPREHENSIVE METABOLIC PANEL
ALT: 10 U/L (ref 0–53)
AST: 21 U/L (ref 0–37)
Albumin: 3.6 g/dL (ref 3.5–5.2)
Alkaline Phosphatase: 77 U/L (ref 39–117)
BUN: 43 mg/dL — ABNORMAL HIGH (ref 6–23)
CO2: 25 mEq/L (ref 19–32)
Calcium: 9.4 mg/dL (ref 8.4–10.5)
Chloride: 103 mEq/L (ref 96–112)
Creatinine, Ser: 1.55 mg/dL — ABNORMAL HIGH (ref 0.40–1.50)
GFR: 46.64 mL/min — ABNORMAL LOW (ref >60.00)
Glucose, Bld: 98 mg/dL (ref 70–99)
Potassium: 4.5 mEq/L (ref 3.5–5.1)
Sodium: 136 mEq/L (ref 135–145)
Total Bilirubin: 0.7 mg/dL (ref 0.2–1.2)
Total Protein: 7.3 g/dL (ref 6.0–8.3)

## 2017-07-07 NOTE — Progress Notes (Signed)
Nylan Nevel. is a 76 y.o. male here for an acute visit.  History of Present Illness:   Shaune Pascal CMA acting as scribe for Dr. Juleen China.  HPI: Patient comes in today for an Acute visit today.   Diarrhea: Patient stated that he has been having diarrhea for a couple weeks. He has had dark stool for about 4 to 5 days. He also had some abdominal pain about 4 or 5 days ago. Patient stated that it was a stabbing pain. He has not had any changes in medications. The concern today is that he may have had some kind of bleed. He has not had a bowel movement today. So with this the bleed may have stopped. We will get STAT labs today to check Hemoglobin and kidney function. If the labs come back abnormal we will refer to GI for Colonoscopy. Denies chest pain, shortness of breath, or any leg edema.   PMHx, SurgHx, SocialHx, Medications, and Allergies were reviewed in the Visit Navigator and updated as appropriate.  Current Medications:   .  albuterol (PROAIR HFA) 108 (90 Base) MCG/ACT inhaler, Inhale 2 puffs into the lungs every 6 (six) hours as needed., Disp: 1 Inhaler, Rfl: 3 .  aspirin EC 81 MG tablet, Take 81 mg by mouth at bedtime. , Disp: , Rfl:  .  Coenzyme Q10 (CO Q10 PO), Take 500 mg by mouth daily., Disp: , Rfl:  .  cycloSPORINE modified (NEORAL) 25 MG capsule, Take 75 mg by mouth 2 (two) times daily. , Disp: , Rfl:  .  fexofenadine (ALLEGRA) 180 MG tablet, Take 180 mg by mouth daily., Disp: , Rfl:  .  fluticasone furoate-vilanterol (BREO ELLIPTA) 100-25 MCG/INH AEPB, Inhale 1 puff into the lungs daily., Disp: 3 each, Rfl: 3 .  furosemide (LASIX) 20 MG tablet, Take 2 tablets (40 mg total) by mouth daily., Disp: 30 tablet, Rfl: 0 .  lactose free nutrition (BOOST) LIQD, Take 237 mLs by mouth daily., Disp: , Rfl:  .  magnesium oxide (MAG-OX) 400 MG tablet, Take 400 mg by mouth 5 (five) times daily. , Disp: , Rfl:  .  Multiple Vitamin (MULTIVITAMIN) tablet, Take 1 tablet by mouth daily.,  Disp: , Rfl:  .  mycophenolate (CELLCEPT) 500 MG tablet, Take 1,500 mg by mouth 2 (two) times daily., Disp: , Rfl:  .  Omega-3 Fatty Acids (FISH OIL) 1000 MG CAPS, Take 1 capsule by mouth every evening., Disp: , Rfl:  .  omeprazole (PRILOSEC) 40 MG capsule, Take 40 mg by mouth daily., Disp: , Rfl:  .  simvastatin (ZOCOR) 40 MG tablet, Take 40 mg by mouth daily., Disp: , Rfl:  .  tadalafil, PAH, (ADCIRCA) 20 MG tablet, Take 2 tablets (40 mg total) by mouth daily., Disp: 60 tablet, Rfl: 11 .  temazepam (RESTORIL) 15 MG capsule, Take 30 mg by mouth at bedtime as needed. For sleep, Disp: , Rfl:  .  tiotropium (SPIRIVA HANDIHALER) 18 MCG inhalation capsule, INHALE THE CONTENTS OF 1 CAPSULE VIA HANDIHALER ONCE DAILY AS DIRECTED, Disp: 30 capsule, Rfl: 5   Allergies  Allergen Reactions  . Ambrisentan Other (See Comments), Itching and Swelling  . Heparin     Made white blood count go down. HIT   Review of Systems:   Pertinent items are noted in the HPI. Otherwise, ROS is negative.  Vitals:   Vitals:   07/07/17 1409  BP: 104/68  Pulse: 86  Temp: 98.1 F (36.7 C)  TempSrc: Oral  SpO2: 92%  Weight: 113 lb 3.2 oz (51.3 kg)  Height: 5' 7" (1.702 m)     Body mass index is 17.73 kg/m.  Physical Exam:   Physical Exam  Constitutional: He is oriented to person, place, and time. He appears well-developed and well-nourished. No distress.  HENT:  Head: Normocephalic and atraumatic.  Right Ear: External ear normal.  Left Ear: External ear normal.  Nose: Nose normal.  Mouth/Throat: Oropharynx is clear and moist.  Eyes: Conjunctivae and EOM are normal. Pupils are equal, round, and reactive to light.  Neck: Normal range of motion. Neck supple.  Cardiovascular: Normal rate, regular rhythm, normal heart sounds and intact distal pulses.  Pulmonary/Chest:  O2 [place].  Abdominal: Soft. Bowel sounds are normal.  Musculoskeletal: Normal range of motion.  Neurological: He is alert and oriented to  person, place, and time.  Skin: Skin is warm and dry.  Psychiatric: He has a normal mood and affect. His behavior is normal. Judgment and thought content normal.  Nursing note and vitals reviewed.   Assessment and Plan:   1. Generalized abdominal pain Abdominal pain has resolved.  He had a very short period of time about 3 days ago during which he described a having a sharp pain in his lower abdomen.  None now.  He denies any current diarrhea, constipation, dysuria, frequency, urgency, back pain, fever, or other red flags.  - CBC with Differential/Platelet - Comprehensive metabolic panel - Fecal occult blood, imunochemical; Future  2. Diarrhea, unspecified type Diarrhea started shortly after taking an antibiotic for infection.  He was given Imodium for constipation with some relief.  It is now improved.  No bowel movement today.  3. Dark stools This is the most concerning symptom.  The patient states that he had dark tarry stools for about 4-5 days.  He denies any increased shortness of breath.  He denies chest pain, lightheadedness, or increased fatigue.  . Reviewed expectations re: course of current medical issues. . Discussed self-management of symptoms. . Outlined signs and symptoms indicating need for more acute intervention. . Patient verbalized understanding and all questions were answered. Marland Kitchen Health Maintenance issues including appropriate healthy diet, exercise, and smoking avoidance were discussed with patient. . See orders for this visit as documented in the electronic medical record. . Patient received an After Visit Summary.  Briscoe Deutscher, DO [place], [facility] 07/07/2017

## 2017-07-14 ENCOUNTER — Telehealth: Payer: Self-pay | Admitting: Pulmonary Disease

## 2017-07-14 NOTE — Telephone Encounter (Signed)
PA request sent from [place] on Adcirca. Key: [id number].  This has been submitted to [email or web address] and sent to plan for review.  A determination is expected within 1-5 business days.  Routing to CP for follow-up.

## 2017-07-15 ENCOUNTER — Other Ambulatory Visit: Payer: Medicare Other

## 2017-07-15 ENCOUNTER — Telehealth: Payer: Self-pay | Admitting: Pulmonary Disease

## 2017-07-15 DIAGNOSIS — R1084 Generalized abdominal pain: Secondary | ICD-10-CM

## 2017-07-15 NOTE — Telephone Encounter (Signed)
According to [email or web address], PA was approved from 07/14/17 to 07/14/18. Per previous message, [facility] is already aware.

## 2017-07-15 NOTE — Telephone Encounter (Signed)
Spoke with Deborra Medina  At [facility] and gave clinical information to continue PA. Nothing further is needed and we will await response. A message was sent to [facility] already to follow up.

## 2017-07-15 NOTE — Telephone Encounter (Signed)
Called [facility] and advised them that the PA for the Tadalfil was approved, Nothing further is needed.

## 2017-07-15 NOTE — Telephone Encounter (Signed)
Janett Billow do you have an update on this matter? Thanks.

## 2017-07-15 NOTE — Telephone Encounter (Signed)
Sorry - have been off for the holidays.  I have 2 faxes from [place]  Fax from last week states that they have updated their "systems and processes to include a Patient Authorization signature for all patients serviced with ASSIST.  We will be obtaining a Patient Authorization signature from your patient as soon as possible in order to proceed with providing any information or assistance.  We are sending a Patient Authorization Form to your patient today..."   Fax in [place] today from [place] with [facility] to my attn: Dr Doloris Hall A request has been made by you or your physician for ASSIST, [employer] and Support Team for [employer]  We have not been able to contact you by phone.  Please call me at [phone], extension [phone], at your convenience so we may review the ASSIST service that will best suit your needs.   LMOM for TCB x1 to discuss

## 2017-07-16 ENCOUNTER — Telehealth: Payer: Self-pay | Admitting: Pulmonary Disease

## 2017-07-16 NOTE — Telephone Encounter (Signed)
Spoke with patient Advised patient that [facility] was called and advised PA for Tadalafil was approved Pt verbalized understanding Nothing further needed

## 2017-07-20 ENCOUNTER — Other Ambulatory Visit: Payer: Medicare Other

## 2017-07-20 ENCOUNTER — Ambulatory Visit: Payer: Medicare Other | Admitting: Family Medicine

## 2017-07-20 DIAGNOSIS — R1084 Generalized abdominal pain: Secondary | ICD-10-CM

## 2017-07-20 LAB — HEMOCCULT SLIDES (X 3 CARDS)
Fecal Occult Blood: POSITIVE — AB
OCCULT 1: POSITIVE — AB
OCCULT 2: POSITIVE — AB
OCCULT 3: POSITIVE — AB
OCCULT 4: POSITIVE — AB
OCCULT 5: POSITIVE — AB

## 2017-07-20 NOTE — Telephone Encounter (Signed)
lmtcb x2 for pt. 

## 2017-07-21 ENCOUNTER — Encounter: Payer: Self-pay | Admitting: Internal Medicine

## 2017-07-21 ENCOUNTER — Telehealth: Payer: Self-pay | Admitting: Pulmonary Disease

## 2017-07-21 ENCOUNTER — Other Ambulatory Visit: Payer: Self-pay

## 2017-07-21 ENCOUNTER — Other Ambulatory Visit (INDEPENDENT_AMBULATORY_CARE_PROVIDER_SITE_OTHER): Payer: Medicare Other

## 2017-07-21 DIAGNOSIS — K921 Melena: Secondary | ICD-10-CM

## 2017-07-21 NOTE — Telephone Encounter (Signed)
Please see telephone encounter dates 07/21/17

## 2017-07-21 NOTE — Telephone Encounter (Signed)
Pt is aware of below message and voiced his understanding. Pt has been provided with [facility] contact number. Pt also states that patient assistant application needs to be renewed.  I have verified pt's address and mailed pt assistant forms to pt.  Pt is aware to bring forms back to our office once complete.         Rinaldo Ratel, CMA 6 days ago     Sorry - have been off for the holidays.  I have 2 faxes from [place]  Fax from last week states that they have updated their "systems and processes to include a Patient Authorization signature for all patients serviced with ASSIST.  We will be obtaining a Patient Authorization signature from your patient as soon as possible in order to proceed with providing any information or assistance.  We are sending a Patient Authorization Form to your patient today..."   Fax in [place] today from [facility] with [facility] to my attn: Dr Doloris Hall A request has been made by you or your physician for ASSIST, [employer] and Support Team for [employer]  We have not been able to contact you by phone.  Please call me at [phone], extension [phone], at your convenience so we may review the ASSIST service that will best suit your needs.   LMOM for TCB x1 to discuss

## 2017-07-21 NOTE — Telephone Encounter (Signed)
LMOM TCB x3

## 2017-07-22 LAB — CBC WITH DIFFERENTIAL/PLATELET
Basophils Absolute: 0 10*3/uL (ref 0.0–0.1)
Basophils Relative: 1.1 % (ref 0.0–3.0)
Eosinophils Absolute: 0 10*3/uL (ref 0.0–0.7)
Eosinophils Relative: 0.7 % (ref 0.0–5.0)
HCT: 37.2 % — ABNORMAL LOW (ref 39.0–52.0)
Hemoglobin: 12.1 g/dL — ABNORMAL LOW (ref 13.0–17.0)
Lymphocytes Relative: 11.1 % — ABNORMAL LOW (ref 12.0–46.0)
Lymphs Abs: 0.5 10*3/uL — ABNORMAL LOW (ref 0.7–4.0)
MCHC: 32.6 g/dL (ref 30.0–36.0)
MCV: 96.8 fl (ref 78.0–100.0)
Monocytes Absolute: 0.5 10*3/uL (ref 0.1–1.0)
Monocytes Relative: 11.5 % (ref 3.0–12.0)
Neutro Abs: 3.3 10*3/uL (ref 1.4–7.7)
Neutrophils Relative %: 75.6 % (ref 43.0–77.0)
Platelets: 134 10*3/uL — ABNORMAL LOW (ref 150.0–400.0)
RBC: 3.84 Mil/uL — ABNORMAL LOW (ref 4.22–5.81)
RDW: 14.8 % (ref 11.5–15.5)
WBC: 4.4 10*3/uL (ref 4.0–10.5)

## 2017-07-22 NOTE — Telephone Encounter (Signed)
Called pt, states he wishes to speak directly with Luke Jacobson.    JJ please advise as you are able.  Thanks.

## 2017-07-22 NOTE — Telephone Encounter (Signed)
Left detailed message for Inez Catalina at [employer] to make her aware that pt is already getting Tadalafil through [facility] mail order pharmacy.    Patient assistance forms were for Spiriva, not Tadalafil.

## 2017-07-22 NOTE — Telephone Encounter (Signed)
Patient called back and would like to speak to Janett Billow further about patient assistance; declined to leave message;

## 2017-07-22 NOTE — Telephone Encounter (Signed)
Martie, [employer]. Pt has been approved for the generic Rx of Adcirca however was approved for the generic, Tadaoafil. [facility] can no longer help the pt with this case because they do not manufactor the generic. Cb for [facility] [phone].

## 2017-07-23 MED ORDER — ADCIRCA 20 MG PO TABS: 40.0000 mg | ORAL_TABLET | Freq: Every day | ORAL | 11 refills | Status: DC

## 2017-07-23 NOTE — Telephone Encounter (Signed)
Called spoke with patient He would like to still have patient assistance for Adcirca but the issue is that they need brand name ADCIRCA.  Advised will resend Rx.  Pt asking if the patient assistance process will have tostarted over.  Advised pt will call [facility] to check.  Called [facility], was on hold [id number] Called pt to let him know Will go ahead and send Rx for ADCIRCA in case they can go ahead and start on this Pt also requesting Spiriva patient assistance forms - will mail to him  Will follow up again on Monday

## 2017-07-27 ENCOUNTER — Ambulatory Visit: Payer: Medicare Other | Admitting: Family Medicine

## 2017-07-27 ENCOUNTER — Other Ambulatory Visit: Payer: Self-pay | Admitting: Family Medicine

## 2017-07-27 ENCOUNTER — Encounter: Payer: Self-pay | Admitting: Family Medicine

## 2017-07-27 VITALS — BP 110/66 | HR 78 | Temp 97.7°F | Wt 116.2 lb

## 2017-07-27 DIAGNOSIS — D649 Anemia, unspecified: Secondary | ICD-10-CM

## 2017-07-27 DIAGNOSIS — R197 Diarrhea, unspecified: Secondary | ICD-10-CM | POA: Diagnosis not present

## 2017-07-27 MED ORDER — ESCITALOPRAM OXALATE 10 MG PO TABS: 10.0000 mg | ORAL_TABLET | Freq: Every day | ORAL | 1 refills | Status: DC

## 2017-07-27 MED ORDER — METRONIDAZOLE 500 MG PO TABS: 500.0000 mg | ORAL_TABLET | Freq: Three times a day (TID) | ORAL | 0 refills | Status: DC

## 2017-07-29 ENCOUNTER — Other Ambulatory Visit: Payer: Medicare Other

## 2017-07-29 DIAGNOSIS — R197 Diarrhea, unspecified: Secondary | ICD-10-CM

## 2017-07-29 NOTE — Progress Notes (Signed)
Luke Jacobson. is a 77 y.o. male is here for follow up.  History of Present Illness:   HPI: See Assessment and Plan section for Problem Based Charting of issues discussed today.  Health Maintenance Due  Topic Date Due  . COLONOSCOPY  02/06/1992   Depression screen [facility] 2/9 01/27/2017 11/11/2016 11/11/2016  Decreased Interest 1 1 0  Down, Depressed, Hopeless _0 PHQ - 2 Score _1 Altered sleeping _2 Tired, decreased energy _3 Change in appetite _4 Feeling bad or failure about yourself  0 1 1  Trouble concentrating 3 0 0  Moving slowly or fidgety/restless 0 0 0  Suicidal thoughts 0 0 0  PHQ-9 Score _5 Difficult doing work/chores - Somewhat difficult -   PMHx, SurgHx, SocialHx, FamHx, Medications, and Allergies were reviewed in the Visit Navigator and updated as appropriate.   Patient Active Problem List   Diagnosis Date Noted  . Inguinal hernia 05/29/2016  . Bilateral inguinal hernia without obstruction or gangrene 04/21/2016  . Laryngopharyngeal reflux (LPR) 01/10/2016  . Arthritis of hand, left 12/26/2015  . Acute kidney injury superimposed on CKD ([place])   . Odynophagia 12/14/2015  . Pharyngoesophageal dysphagia 12/14/2015  . Regurgitation 12/13/2015  . Aspiration pneumonia ([place]) 12/13/2015  . CKD (chronic kidney disease), stage III ([place]) 12/13/2015  . ILD (interstitial lung disease) ([place]) 09/18/2015  . Pulmonary hypertension ([place]) 12/21/2013  . Chronic respiratory failure ([place]) 03/10/2012  . CAD (coronary artery disease) 03/10/2012  . Heart replaced by transplant ([place]) 03/10/2012  . Diastolic CHF, acute on chronic ([place]) 03/10/2012  . COPD (chronic obstructive pulmonary disease) ([place]) 03/10/2012  . Hypertension   . History of tobacco use 09/08/2011  . Nephrolithiasis 09/08/2011  . Vasculopathy of cardiac allograft ([place]) 09/08/2011  . HIT (heparin-induced thrombocytopenia) ([place]) 09/08/2011   Social History   Tobacco Use  . Smoking  status: Former Smoker    Packs/day: 1.00    Years: 30.00    Pack years: 30.00    Types: Cigarettes    Last attempt to quit: 03/10/1994    Years since quitting: 23.4  . Smokeless tobacco: Never Used  Substance Use Topics  . Alcohol use: Yes    Comment: social  . Drug use: No   Current Medications and Allergies:   .  ADCIRCA 20 MG tablet, Take 2 tablets (40 mg total) by mouth daily. BRAND NAME ONLY, Disp: 60 tablet, Rfl: 11 .  albuterol (PROAIR HFA) 108 (90 Base) MCG/ACT inhaler, Inhale 2 puffs into the lungs every 6 (six) hours as needed., Disp: 1 Inhaler, Rfl: 3 .  aspirin EC 81 MG tablet, Take 81 mg by mouth at bedtime. , Disp: , Rfl:  .  Coenzyme Q10 (CO Q10 PO), Take 500 mg by mouth daily., Disp: , Rfl:  .  cycloSPORINE modified (NEORAL) 25 MG capsule, Take 75 mg by mouth 2 (two) times daily. , Disp: , Rfl:  .  fexofenadine (ALLEGRA) 180 MG tablet, Take 180 mg by mouth daily., Disp: , Rfl:  .  fluticasone furoate-vilanterol (BREO ELLIPTA) 100-25 MCG/INH AEPB, Inhale 1 puff into the lungs daily., Disp: 3 each, Rfl: 3 .  furosemide (LASIX) 20 MG tablet, Take 2 tablets (40 mg total) by mouth daily., Disp: 30 tablet, Rfl: 0 .  lactose free nutrition (BOOST) LIQD, Take 237 mLs by mouth daily., Disp: , Rfl:  .  magnesium oxide (MAG-OX) 400 MG  tablet, Take 400 mg by mouth 5 (five) times daily. , Disp: , Rfl:  .  Multiple Vitamin (MULTIVITAMIN) tablet, Take 1 tablet by mouth daily., Disp: , Rfl:  .  mycophenolate (CELLCEPT) 500 MG tablet, Take 1,500 mg by mouth 2 (two) times daily., Disp: , Rfl:  .  Omega-3 Fatty Acids (FISH OIL) 1000 MG CAPS, Take 1 capsule by mouth every evening., Disp: , Rfl:  .  omeprazole (PRILOSEC) 40 MG capsule, Take 40 mg by mouth daily., Disp: , Rfl:  .  simvastatin (ZOCOR) 40 MG tablet, Take 40 mg by mouth daily., Disp: , Rfl:  .  temazepam (RESTORIL) 15 MG capsule, Take 30 mg by mouth at bedtime as needed. For sleep, Disp: , Rfl:  .  tiotropium (SPIRIVA  HANDIHALER) 18 MCG inhalation capsule, INHALE THE CONTENTS OF 1 CAPSULE VIA HANDIHALER ONCE DAILY AS DIRECTED, Disp: 30 capsule, Rfl: 5 .  escitalopram (LEXAPRO) 10 MG tablet, TAKE 1 TABLET(10 MG) BY MOUTH DAILY, Disp: 90 tablet, Rfl: 1   Allergies  Allergen Reactions  . Ambrisentan Other (See Comments), Itching and Swelling  . Clopidogrel Other (See Comments)    Epistaxis   . Heparin     Made white blood count go down. HIT   Review of Systems   Pertinent items are noted in the HPI. Otherwise, ROS is negative.  Vitals:   Vitals:   07/27/17 1122  BP: 110/66  Pulse: 78  Temp: 97.7 F (36.5 C)  TempSrc: Oral  SpO2: 90%  Weight: 116 lb 3.2 oz (52.7 kg)     Body mass index is 18.2 kg/m.   Physical Exam:   Physical Exam  Constitutional: He is oriented to person, place, and time. He appears well-developed and well-nourished. No distress.  HENT:  Head: Normocephalic and atraumatic.  Right Ear: External ear normal.  Left Ear: External ear normal.  Nose: Nose normal.  Mouth/Throat: Oropharynx is clear and moist.  Eyes: Conjunctivae and EOM are normal. Pupils are equal, round, and reactive to light.  Neck: Normal range of motion. Neck supple.  Cardiovascular: Normal rate, regular rhythm, normal heart sounds and intact distal pulses.  Pulmonary/Chest:  O2 [place].  Abdominal: Soft. Bowel sounds are normal.  Musculoskeletal: Normal range of motion.  Neurological: He is alert and oriented to person, place, and time.  Skin: Skin is warm and dry.  Psychiatric: He has a normal mood and affect. His behavior is normal. Judgment and thought content normal.  Nursing note and vitals reviewed.  Results for orders placed or performed in visit on 07/21/17  CBC with Differential/Platelet  Result Value Ref Range   WBC 4.4 4.0 - 10.5 K/uL   RBC 3.84 (L) 4.22 - 5.81 Mil/uL   Hemoglobin 12.1 (L) 13.0 - 17.0 g/dL   HCT 37.2 (L) 39.0 - 52.0 %   MCV 96.8 78.0 - 100.0 fl   MCHC 32.6 30.0 - 36.0  g/dL   RDW 14.8 11.5 - 15.5 %   Platelets 134.0 (L) 150.0 - 400.0 K/uL   Neutrophils Relative % 75.6 43.0 - 77.0 %   Lymphocytes Relative 11.1 (L) 12.0 - 46.0 %   Monocytes Relative 11.5 3.0 - 12.0 %   Eosinophils Relative 0.7 0.0 - 5.0 %   Basophils Relative 1.1 0.0 - 3.0 %   Neutro Abs 3.3 1.4 - 7.7 K/uL   Lymphs Abs 0.5 (L) 0.7 - 4.0 K/uL   Monocytes Absolute 0.5 0.1 - 1.0 K/uL   Eosinophils Absolute 0.0 0.0 -  0.7 K/uL   Basophils Absolute 0.0 0.0 - 0.1 K/uL   Assessment and Plan:   Derrius was seen today for follow-up and diarrhea.  Diagnoses and all orders for this visit:  Diarrhea of presumed infectious origin Comments: Risk factors: wife with C. difficile colitis, recent antibiotic use, ongoing diarrhea that is now greenish in color after stopping the Pepto Bismol, immunocompromised. Mild abdominal cramping. Labs stable. Will go ahead and treat while awaiting lab results.  Orders: -     metroNIDAZOLE (FLAGYL) 500 MG tablet; Take 1 tablet (500 mg total) by mouth 3 (three) times daily. - Stool kit for c. diff.   Anemia, unspecified type Comments:  CBC Latest Ref Rng & Units 07/21/2017 07/07/2017 01/27/2017  WBC 4.0 - 10.5 K/uL 4.4 4.4 5.0  Hemoglobin 13.0 - 17.0 g/dL 12.1(L) 12.3(L) 12.3(L)  Hematocrit 39.0 - 52.0 % 37.2(L) 39.2 37.3(L)  Platelets 150.0 - 400.0 K/uL 134.0(L) 149.0(L) 138.0(L)    . Reviewed expectations re: course of current medical issues. . Discussed self-management of symptoms. . Outlined signs and symptoms indicating need for more acute intervention. . Patient verbalized understanding and all questions were answered. Marland Kitchen Health Maintenance issues including appropriate healthy diet, exercise, and smoking avoidance were discussed with patient. . See orders for this visit as documented in the electronic medical record. . Patient received an After Visit Summary.  Briscoe Deutscher, DO [place], [place] 07/29/2017  Future Appointments  Date Time  Provider [place]  08/26/2017 10:15 AM Irene Shipper, MD [facility]

## 2017-07-30 LAB — C. DIFFICILE GDH AND TOXIN A/B
GDH ANTIGEN: NOT DETECTED
MICRO NUMBER:: 90072492
SPECIMEN QUALITY:: ADEQUATE
TOXIN A AND B: NOT DETECTED

## 2017-07-30 NOTE — Telephone Encounter (Signed)
Pt is calling back [phone]

## 2017-07-30 NOTE — Telephone Encounter (Signed)
lmtcb X1 to see if pt has received spiriva pt assistance forms.

## 2017-07-30 NOTE — Telephone Encounter (Signed)
ATC pt, no answer. Left message for pt to call back.  

## 2017-08-02 NOTE — Telephone Encounter (Signed)
Called spoke with patient  Luke Jacobson over below information with pt regarding assistance with sprivia and adcirca medication.  Pt is returning to the office this Thursday 08/05/17 to turn in sprivia forms back to CMA Pt verbalized understanding of below information on the status of medications He had no further concerns or questions at this time

## 2017-08-02 NOTE — Telephone Encounter (Signed)
Pt is calling back [phone]

## 2017-08-02 NOTE — Telephone Encounter (Signed)
LMOM TCB x1 for Luke Jacobson with [employer] to follow up from last week (she reported Adcirca has to be brand-name only for Patient Assistance.  Rx was resent but pt wanted to know if the patient assistance process would have to be started all over again).  Told Luke Jacobson she can ask for me by name

## 2017-08-03 NOTE — Telephone Encounter (Signed)
Luke Jacobson - [employer] [phone] returning Luke Jacobson's call.  They spoke with patient around 01/09 and verified insurance coverage.  They know PA was submitted, plan approved for the generic.  They have explained to the patient and encouraged the patient to reach back out to [place] regarding.

## 2017-08-03 NOTE — Telephone Encounter (Signed)
Spoke with pt and advised message. He states he cannot afford the Adcirca and would like to have an alternative. Pt states he is dropping off paperwork for the Spiriva tomorrow.   I spoke with [facility] and she states they do not have coverage for pts with a Part D plan and could not tell me the cost of the medication. They do not deal with generic drugs.

## 2017-08-04 ENCOUNTER — Ambulatory Visit: Payer: Medicare Other | Admitting: Physician Assistant

## 2017-08-04 ENCOUNTER — Other Ambulatory Visit: Payer: No Typology Code available for payment source

## 2017-08-04 ENCOUNTER — Encounter: Payer: Self-pay | Admitting: Physician Assistant

## 2017-08-04 VITALS — BP 116/74 | HR 82 | Ht 67.0 in | Wt 114.0 lb

## 2017-08-04 DIAGNOSIS — R195 Other fecal abnormalities: Secondary | ICD-10-CM | POA: Diagnosis not present

## 2017-08-04 DIAGNOSIS — R1310 Dysphagia, unspecified: Secondary | ICD-10-CM

## 2017-08-04 DIAGNOSIS — R197 Diarrhea, unspecified: Secondary | ICD-10-CM | POA: Diagnosis not present

## 2017-08-04 DIAGNOSIS — K219 Gastro-esophageal reflux disease without esophagitis: Secondary | ICD-10-CM

## 2017-08-04 NOTE — Telephone Encounter (Signed)
Pt came to office to drop off paperwork. Paperwork placed in Dr. Bari Mantis folder.

## 2017-08-04 NOTE — Patient Instructions (Signed)
Please go to the basement level to our lab for stool studies.  You have been scheduled for a Barium Esophogram at [facility] (1st floor of the hospital) on Tuesday at 08-17-2017. Please arrive at 9:15 minutes am  prior to your appointment for registration. Make certain not to have anything to eat or drink 3 hours prior to your test. If you need to reschedule for any reason, please contact radiology at [phone] to do so.  Continue the Omeprazole 20 mg, take 1 tab by mouth every morning. Finish the course of Metronidazole.  If you are age 87 or older, your body mass index should be between 23-30. Your Body mass index is 17.85 kg/m. If this is out of the aforementioned range listed, please consider follow up with your Primary Care Provider.  You will be established with Dr. Wilfrid Lund.  __________________________________________________________________ A barium swallow is an examination that concentrates on views of the esophagus. This tends to be a double contrast exam (barium and two liquids which, when combined, create a gas to distend the wall of the oesophagus) or single contrast (non-ionic iodine based). The study is usually tailored to your symptoms so a good history is essential. Attention is paid during the study to the form, structure and configuration of the esophagus, looking for functional disorders (such as aspiration, dysphagia, achalasia, motility and reflux) EXAMINATION You may be asked to change into a gown, depending on the type of swallow being performed. A radiologist and radiographer will perform the procedure. The radiologist will advise you of the type of contrast selected for your procedure and direct you during the exam. You will be asked to stand, sit or lie in several different positions and to hold a small amount of fluid in your mouth before being asked to swallow while the imaging is performed .In some instances you may be asked to swallow barium coated  marshmallows to assess the motility of a solid food bolus. The exam can be recorded as a digital or video fluoroscopy procedure. POST PROCEDURE It will take 1-2 days for the barium to pass through your system. To facilitate this, it is important, unless otherwise directed, to increase your fluids for the next 24-48hrs and to resume your normal diet.  This test typically takes about 30 minutes to perform. __________________________________________________________________________________

## 2017-08-04 NOTE — Progress Notes (Signed)
Subjective:    Patient ID: Luke Jacobson., male    DOB: 07/06/1941, 77 y.o.   MRN: [id number]  HPI Luke Jacobson is a 77 year old white male, new to GI today referred by Dr. Briscoe Deutscher D.O. for evaluation of positive Hemoccults and recent diarrhea. Patient has complicated past medical history, he is status post heart transplant in 1995 done at [facility], is chronically immunosuppressed with cyclosporine and CellCept. He has hypertension, congestive heart failure, pulmonary hypertension, COPD with chronic oxygen requirement [place] at least 5 L nasal cannula, interstitial lung disease, chronic kidney disease and history of LPR. He remembers having prior colonoscopies but not who did them. He thinks he had a colonoscopy sometime in the past 20 years in [place]. I believe this may have been done by Dr. Oletta Lamas. Says his current symptoms started around Christmas time. His wife had also been ill with diarrhea and per Dr. Alcario Drought note had C. difficile. Asian says it onset of his symptoms he was having 3 or 4 diarrheal stools per day with some lower abdominal cramping, no bleeding, no fever or chills. He was seen by primary care, had stool for C. Difficile toxin checked which was negative. He was started on an empiric course of metronidazole which he is still completing. He says his diarrhea is a little bit better but still having 2 loose bowel movements per day. Is not having any significant abdominal discomfort or cramping. Hemoccults were checked and these returned positive.  Patient's other complaint is a sensation of dysphagia. He stays on omeprazole 40 mg by mouth daily. He says he was hospitalized year so ago and had difficulty with painful swallowing. He believes he was found to have a "ulcer" in his throat which was treated with medication. He was also seen by ENT at that time. He says this feels similar but not  nearly as severe and has been ongoing for months. He has a fullness in his throat and a  sensation as if he needs to belch. He is actually able to swallow solid food without difficulty but says sometimes he strangles  and difficulty with pills. I reviewed hospital records from that hospitalization in 2017, he was seen by [facility]/Dr. Paulita Fujita, and had a barium swallow done that showed questionable esophageal ulcer along the anterior lateral middle third of the esophagus, and nonspecific dysmotility. It was felt this may have been related to candidiasis and he was treated empirically with Diflucan. He did not undergo EGD due to pulmonary status. He was also seen by ENT during that same admission, had laryngoscopy that showed supraglottitis  Review of Systems Pertinent positive and negative review of systems were noted in the above HPI section.  All other review of systems was otherwise negative.  Outpatient Encounter Medications as of 08/04/2017  Medication Sig  . ADCIRCA 20 MG tablet Take 2 tablets (40 mg total) by mouth daily. BRAND NAME ONLY  . albuterol (PROAIR HFA) 108 (90 Base) MCG/ACT inhaler Inhale 2 puffs into the lungs every 6 (six) hours as needed.  Marland Kitchen aspirin EC 81 MG tablet Take 81 mg by mouth at bedtime.   . Coenzyme Q10 (CO Q10 PO) Take 500 mg by mouth daily.  . cycloSPORINE modified (NEORAL) 25 MG capsule Take 75 mg by mouth 2 (two) times daily.   Marland Kitchen escitalopram (LEXAPRO) 10 MG tablet TAKE 1 TABLET(10 MG) BY MOUTH DAILY  . fexofenadine (ALLEGRA) 180 MG tablet Take 180 mg by mouth daily.  . fluticasone furoate-vilanterol (BREO  ELLIPTA) 100-25 MCG/INH AEPB Inhale 1 puff into the lungs daily.  . furosemide (LASIX) 20 MG tablet Take 2 tablets (40 mg total) by mouth daily.  Marland Kitchen lactose free nutrition (BOOST) LIQD Take 237 mLs by mouth daily.  . magnesium oxide (MAG-OX) 400 MG tablet Take 400 mg by mouth 5 (five) times daily.   . metroNIDAZOLE (FLAGYL) 500 MG tablet Take 1 tablet (500 mg total) by mouth 3 (three) times daily.  . Multiple Vitamin (MULTIVITAMIN) tablet Take 1 tablet  by mouth daily.  . mycophenolate (CELLCEPT) 500 MG tablet Take 1,500 mg by mouth 2 (two) times daily.  . Omega-3 Fatty Acids (FISH OIL) 1000 MG CAPS Take 1 capsule by mouth every evening.  Marland Kitchen omeprazole (PRILOSEC) 40 MG capsule Take 40 mg by mouth daily.  . OXYGEN Inhale 5 L into the lungs daily.  . simvastatin (ZOCOR) 40 MG tablet Take 40 mg by mouth daily.  . tadalafil (CIALIS) 20 MG tablet Take 20 mg by mouth daily as needed for erectile dysfunction.  . temazepam (RESTORIL) 15 MG capsule Take 30 mg by mouth at bedtime as needed. For sleep  . tiotropium (SPIRIVA HANDIHALER) 18 MCG inhalation capsule INHALE THE CONTENTS OF 1 CAPSULE VIA HANDIHALER ONCE DAILY AS DIRECTED   No facility-administered encounter medications on file as of 08/04/2017.    Allergies  Allergen Reactions  . Ambrisentan Other (See Comments), Itching and Swelling  . Clopidogrel Other (See Comments)    Epistaxis   . Heparin     Made white blood count go down. HIT   Patient Active Problem List   Diagnosis Date Noted  . Inguinal hernia 05/29/2016  . Bilateral inguinal hernia without obstruction or gangrene 04/21/2016  . Laryngopharyngeal reflux (LPR) 01/10/2016  . Arthritis of hand, left 12/26/2015  . Acute kidney injury superimposed on CKD ([place])   . Odynophagia 12/14/2015  . Pharyngoesophageal dysphagia 12/14/2015  . Regurgitation 12/13/2015  . Aspiration pneumonia ([place]) 12/13/2015  . CKD (chronic kidney disease), stage III ([place]) 12/13/2015  . ILD (interstitial lung disease) ([place]) 09/18/2015  . Pulmonary hypertension ([place]) 12/21/2013  . Chronic respiratory failure ([place]) 03/10/2012  . CAD (coronary artery disease) 03/10/2012  . Heart replaced by transplant ([place]) 03/10/2012  . Diastolic CHF, acute on chronic ([place]) 03/10/2012  . COPD (chronic obstructive pulmonary disease) ([place]) 03/10/2012  . Hypertension   . History of tobacco use 09/08/2011  . Nephrolithiasis 09/08/2011  . Vasculopathy of cardiac allograft  ([place]) 09/08/2011  . HIT (heparin-induced thrombocytopenia) ([place]) 09/08/2011   Social History   Socioeconomic History  . Marital status: Single    Spouse name: Not on file  . Number of children: Not on file  . Years of education: Not on file  . Highest education level: Not on file  Social Needs  . Financial resource strain: Not on file  . Food insecurity - worry: Not on file  . Food insecurity - inability: Not on file  . Transportation needs - medical: Not on file  . Transportation needs - non-medical: Not on file  Occupational History  . Occupation: retired    Comment: Nurse, mental health, Equities trader, Architect  Tobacco Use  . Smoking status: Former Smoker    Packs/day: 1.00    Years: 30.00    Pack years: 30.00    Types: Cigarettes    Last attempt to quit: 03/10/1994    Years since quitting: 23.4  . Smokeless tobacco: Never Used  Substance and Sexual Activity  . Alcohol use: Yes  Comment: social  . Drug use: No  . Sexual activity: Not on file  Other Topics Concern  . Not on file  Social History Narrative  . Not on file    Mr. Urbani family history includes Heart attack in his father; Heart disease in his mother; Rheum arthritis in his mother and paternal grandfather.      Objective:    Vitals:   08/04/17 1515  BP: 116/74  Pulse: 82    Physical Exam well-developed older white male, in no acute distress, pleasant blood pressure 116/74 pulse 82, height 5 foot 7, weight 114, BMI 17.8. Patient is on 5 L of nasal O2. HEENT; nontraumatic, normocephalic EOMI PERRLA sclera anicteric, Cardiovascular; regular rate and rhythm with S1-S2 there's soft murmur, Pulmonary; decreased breath sounds bilaterally, Abdomen; flat, nontender nondistended no palpable mass or hepatosplenomegaly he may have a small ventral hernia in the epigastrium, bowel sounds present, Rectal ;exam not done, Ext; no clubbing cyanosis or edema skin warm and dry, Neuropsych; mood and affect  appropriate       Assessment & Plan:   #14 77 year old white male, referred for evaluation of 4 week history of persistent mild diarrhea, and positive Hemoccults. Diarrhea is most likely infectious, patient's wife had C. difficile. He is on an empiric course of metronidazole with improvement in symptoms, despite negative C. difficile toxin. Strongly suspect that the heme positive stool is secondary to bowel inflammation with acute infectious colitis.  #2 chronic immunosuppression-on cyclosporine and CellCept #3 status post heart transplant in 1995 #4 COPD/chronic respiratory failure/on chronic O2 5 L nasal cannula #5 pulmonary hypertension #6 congestive heart failure #7 chronic kidney disease #8 vague dysphagia with sensation of fullness in  throat. Patient had a mid-esophageal ulcer on barium swallow 2017 treated as potential candidiasis in immunocompromised patient who was hospitalized with pneumonia. #9  colon cancer surveillance-last colonoscopy remote, we'll attempt to get records. Patient is a poor colonoscopy candidate due to  cardiopulmonary disease as outlined above  Plan; GI pathogen panel, stool for lactoferrin Complete current course of metronidazole Will obtain previous records from [facility] for prior colonoscopies. Continue omeprazole 40 mg by mouth every morning Schedule for barium swallow with tablet. Patient will be established with Dr. Loletha Carrow. Further plans pending results of above. Patient is a poor candidate for endoscopic evaluation due to his severe cardiopulmonary disease and we will hope to be able  to avoid procedures.    Amy S Esterwood PA-C 08/04/2017   Cc: Briscoe Deutscher, DO

## 2017-08-04 NOTE — Telephone Encounter (Signed)
Routing this to [facility] for her to follow up on

## 2017-08-05 ENCOUNTER — Ambulatory Visit: Payer: Self-pay | Admitting: Physician Assistant

## 2017-08-05 NOTE — Progress Notes (Signed)
Thank you for sending this case to me. I have reviewed the entire note, and the outlined plan seems appropriate.  Agreed the duration and course still favors infectious cause.  Testing as planned, can use imodium as-needed. Significance of heme positive stool uncertain. He is a poor candidate for elective procedures requiring sedation.  Wilfrid Lund, MD

## 2017-08-05 NOTE — Telephone Encounter (Signed)
I have filled out the forms. Will have RA sign them once he returns to clinic on Monday.

## 2017-08-09 ENCOUNTER — Telehealth: Payer: Self-pay | Admitting: *Deleted

## 2017-08-09 MED ORDER — TIOTROPIUM BROMIDE MONOHYDRATE 18 MCG IN CAPS: ORAL_CAPSULE | RESPIRATORY_TRACT | 11 refills | Status: DC

## 2017-08-09 NOTE — Telephone Encounter (Signed)
Patient said its looking better & he did not go to ED

## 2017-08-09 NOTE — Telephone Encounter (Signed)
Patient has been scheduled. He has also been updated it will be at 11am due to Dr. Juleen China schedule.

## 2017-08-09 NOTE — Telephone Encounter (Signed)
He will need Abx. Can he be worked in anywhere?

## 2017-08-09 NOTE — Telephone Encounter (Signed)
Please schedule

## 2017-08-09 NOTE — Telephone Encounter (Signed)
Spoke to patient he did not want to see anyone but Dr. Juleen China. I have made app for tomorrow. Patient if he has any redness, fever, swelling or n&V to go to ED ASAP. He repeated back to me and agreed.   Can you put this patient in at the 10:40 tomorrow it will not let me. EW aware of app patient informed.

## 2017-08-09 NOTE — Telephone Encounter (Signed)
Please be advised.

## 2017-08-09 NOTE — Telephone Encounter (Signed)
Forms have been faxed to [facility].

## 2017-08-09 NOTE — Telephone Encounter (Signed)
Pt called to inform that he is fine at the moment and does not need an appt, but if so he will call to schedule

## 2017-08-09 NOTE — Telephone Encounter (Signed)
Per [facility] Call: Caller states he was bit on the hand several times. One of the bites are kind of bad. Wants to know if he needs to take medication due to a suppressed immune system from a different medication he takes.  Caller states his puppy bit him several times on the hand about an hour ago. He is up to date on Tetanus. He has about 12 puncture wounds. Puppy is 50 months old and up to date on shots. Puppy had her foot caught in a vent and caller was trying to free her food, she was painful and bit him several times.  [facility] advised patient to go to ED now. No record of ED visit in Epic.

## 2017-08-09 NOTE — Telephone Encounter (Signed)
Called patient to check on him no answer l/m to call office. Do not see where he has gone to ED as advised.

## 2017-08-09 NOTE — Telephone Encounter (Signed)
Called and l/m at both numbers.

## 2017-08-10 ENCOUNTER — Ambulatory Visit: Payer: Medicare Other | Admitting: Family Medicine

## 2017-08-10 ENCOUNTER — Encounter: Payer: Self-pay | Admitting: Family Medicine

## 2017-08-10 VITALS — BP 118/72 | HR 86 | Temp 97.6°F | Wt 117.0 lb

## 2017-08-10 DIAGNOSIS — S61451A Open bite of right hand, initial encounter: Secondary | ICD-10-CM

## 2017-08-10 DIAGNOSIS — W540XXA Bitten by dog, initial encounter: Secondary | ICD-10-CM | POA: Diagnosis not present

## 2017-08-10 DIAGNOSIS — S61452A Open bite of left hand, initial encounter: Secondary | ICD-10-CM | POA: Diagnosis not present

## 2017-08-10 MED ORDER — MUPIROCIN 2 % EX OINT: 1.0000 "application " | TOPICAL_OINTMENT | Freq: Two times a day (BID) | CUTANEOUS | 0 refills | Status: DC

## 2017-08-10 MED ORDER — AMOXICILLIN-POT CLAVULANATE 875-125 MG PO TABS: 1.0000 | ORAL_TABLET | Freq: Two times a day (BID) | ORAL | 0 refills | Status: DC

## 2017-08-10 NOTE — Progress Notes (Signed)
Luke Jacobson. is a 77 y.o. male here for an acute visit.  History of Present Illness:   Luke Jacobson, CMA acting as scribe for Dr. Briscoe Deutscher.   HPI: Patient's puppy was stuck in vent and he was trying to get it out. Puppy bit at his hands. Left thumb area is worse. Happened on Saturday. He has been keeping dry and clean. No drainage or redness in the area.   PMHx, SurgHx, SocialHx, Medications, and Allergies were reviewed in the Visit Navigator and updated as appropriate.  Current Medications:   .  albuterol (PROAIR HFA) 108 (90 Base) MCG/ACT inhaler, Inhale 2 puffs into the lungs every 6 (six) hours as needed., Disp: 1 Inhaler, Rfl: 3 .  aspirin EC 81 MG tablet, Take 81 mg by mouth at bedtime. , Disp: , Rfl:  .  Coenzyme Q10 (CO Q10 PO), Take 500 mg by mouth daily., Disp: , Rfl:  .  cycloSPORINE modified (NEORAL) 25 MG capsule, Take 75 mg by mouth 2 (two) times daily. , Disp: , Rfl:  .  escitalopram (LEXAPRO) 10 MG tablet, TAKE 1 TABLET(10 MG) BY MOUTH DAILY, Disp: 90 tablet, Rfl: 1 .  fexofenadine (ALLEGRA) 180 MG tablet, Take 180 mg by mouth daily., Disp: , Rfl:  .  fluticasone furoate-vilanterol (BREO ELLIPTA) 100-25 MCG/INH AEPB, Inhale 1 puff into the lungs daily., Disp: 3 each, Rfl: 3 .  furosemide (LASIX) 20 MG tablet, Take 2 tablets (40 mg total) by mouth daily., Disp: 30 tablet, Rfl: 0 .  lactose free nutrition (BOOST) LIQD, Take 237 mLs by mouth daily., Disp: , Rfl:  .  magnesium oxide (MAG-OX) 400 MG tablet, Take 400 mg by mouth 5 (five) times daily. , Disp: , Rfl:  .  metroNIDAZOLE (FLAGYL) 500 MG tablet, Take 1 tablet (500 mg total) by mouth 3 (three) times daily., Disp: 21 tablet, Rfl: 0 .  Multiple Vitamin (MULTIVITAMIN) tablet, Take 1 tablet by mouth daily., Disp: , Rfl:  .  mycophenolate (CELLCEPT) 500 MG tablet, Take 1,500 mg by mouth 2 (two) times daily., Disp: , Rfl:  .  Omega-3 Fatty Acids (FISH OIL) 1000 MG CAPS, Take 1 capsule by mouth every  evening., Disp: , Rfl:  .  omeprazole (PRILOSEC) 40 MG capsule, Take 40 mg by mouth daily., Disp: , Rfl:  .  OXYGEN, Inhale 5 L into the lungs daily., Disp: , Rfl:  .  simvastatin (ZOCOR) 40 MG tablet, Take 40 mg by mouth daily., Disp: , Rfl:  .  tadalafil (CIALIS) 20 MG tablet, Take 20 mg by mouth daily as needed for erectile dysfunction., Disp: , Rfl:  .  temazepam (RESTORIL) 15 MG capsule, Take 30 mg by mouth at bedtime as needed. For sleep, Disp: , Rfl:  .  tiotropium (SPIRIVA HANDIHALER) 18 MCG inhalation capsule, INHALE THE CONTENTS OF 1 CAPSULE VIA HANDIHALER ONCE DAILY AS DIRECTED, Disp: 30 capsule, Rfl: 11   Allergies  Allergen Reactions  . Ambrisentan Other (See Comments), Itching and Swelling  . Clopidogrel Other (See Comments)    Epistaxis   . Heparin     Made white blood count go down. HIT   Review of Systems:   Pertinent items are noted in the HPI. Otherwise, ROS is negative.  Vitals:   Vitals:   08/10/17 1113  BP: 118/72  Pulse: 86  Temp: 97.6 F (36.4 C)  TempSrc: Oral  Weight: 117 lb (53.1 kg)     Body mass index is 18.32 kg/m.  Physical Exam:   Physical Exam  Constitutional: He is oriented to person, place, and time. No distress.  HENT:  Head: Normocephalic and atraumatic.  Eyes: Conjunctivae are normal.  Neck: Normal range of motion.  Cardiovascular: Normal rate.  Pulmonary/Chest: Effort normal.  Abdominal: Soft.  Neurological: He is alert and oriented to person, place, and time.  Skin:  Multiple clean healing puncture wounds bilateral hands. No sign of active infection. No swelling.     Assessment and Plan:   1. Dog bite of left hand, initial encounter None look infected today. Discussed the importance of treatment if ANY signs of infection - redness, swelling, pain - as he is immunocompromised. At the same time, he is getting over a presumed c diff colitis - so will hold antibiotics. Safety net Rx provided.   - amoxicillin-clavulanate  (AUGMENTIN) 875-125 MG tablet; Take 1 tablet by mouth 2 (two) times daily.  Dispense: 20 tablet; Refill: 0 - mupirocin ointment (BACTROBAN) 2 %; Place 1 application into the nose 2 (two) times daily.  Dispense: 22 g; Refill: 0  2. Dog bite of right hand, initial encounter See above.  - amoxicillin-clavulanate (AUGMENTIN) 875-125 MG tablet; Take 1 tablet by mouth 2 (two) times daily.  Dispense: 20 tablet; Refill: 0 - mupirocin ointment (BACTROBAN) 2 %; Place 1 application into the nose 2 (two) times daily.  Dispense: 22 g; Refill: 0   . Reviewed expectations re: course of current medical issues. . Discussed self-management of symptoms. . Outlined signs and symptoms indicating need for more acute intervention. . Patient verbalized understanding and all questions were answered. Marland Kitchen Health Maintenance issues including appropriate healthy diet, exercise, and smoking avoidance were discussed with patient. . See orders for this visit as documented in the electronic medical record. . Patient received an After Visit Summary.  CMA served as Education administrator during this visit. History, Physical, and Plan performed by medical provider. The above documentation has been reviewed and is accurate and complete. Briscoe Deutscher, D.O.   Briscoe Deutscher, DO [place] [place] [facility] 08/12/2017

## 2017-08-17 ENCOUNTER — Ambulatory Visit (HOSPITAL_COMMUNITY)
Admission: RE | Admit: 2017-08-17 | Discharge: 2017-08-17 | Disposition: A | Discharge status: Home / Self Care | Payer: Medicare Other | Source: Ambulatory Visit | Attending: Physician Assistant | Admitting: Physician Assistant

## 2017-08-17 DIAGNOSIS — R933 Abnormal findings on diagnostic imaging of other parts of digestive tract: Secondary | ICD-10-CM | POA: Diagnosis not present

## 2017-08-17 DIAGNOSIS — R1310 Dysphagia, unspecified: Secondary | ICD-10-CM | POA: Insufficient documentation

## 2017-08-18 ENCOUNTER — Telehealth: Payer: Self-pay | Admitting: Physician Assistant

## 2017-08-18 NOTE — Telephone Encounter (Signed)
Called patient back. Left message. See the results note from imaging

## 2017-08-19 ENCOUNTER — Telehealth: Payer: Self-pay | Admitting: Pulmonary Disease

## 2017-08-19 NOTE — Telephone Encounter (Signed)
Called and spoke with patient advised him that we would contact the company then keep him updated as far as the status. Called [employer] and they advised that it takes 15 business for them to receive paperwork. They advised that if we have not received any info on this by 2.18.19 to call back. Called and advised Luke Jacobson of this. LMTCB

## 2017-08-24 ENCOUNTER — Telehealth: Payer: Self-pay | Admitting: Pulmonary Disease

## 2017-08-24 MED ORDER — TADALAFIL 20 MG PO TABS: 20.0000 mg | ORAL_TABLET | Freq: Every day | ORAL | 0 refills | Status: DC | PRN

## 2017-08-24 NOTE — Telephone Encounter (Signed)
Called and spoke with patient. Placed sample up front for patient. Patient also had question about Tadalifil. Sent refill to pharmacy. Nothing further needed.

## 2017-08-26 ENCOUNTER — Ambulatory Visit: Payer: Self-pay | Admitting: Internal Medicine

## 2017-08-27 ENCOUNTER — Telehealth: Payer: Self-pay | Admitting: Pulmonary Disease

## 2017-08-27 NOTE — Telephone Encounter (Signed)
Spoke with patient. He stated that he has been having a hard time trying to obtain his Adcirca. He can not afford the medication and is not getting anywhere with patient assistance.   He wants to know if there is another medication he can switch to for his pulmonary hypertension?  RA, please advise. Thanks!

## 2017-08-30 NOTE — Telephone Encounter (Signed)
RA, I spoke with CHF clinic and they stated that the patient is not being seen there. They are unable to give samples. Can you please verify the strength of sildenafil that you want with the quantity and refill. Thanks.

## 2017-08-30 NOTE — Telephone Encounter (Signed)
Can we ask CHF clinic ([facility]rs [facility] ) if they have any samples please? Otherwise switch to sildenafil  twice daily

## 2017-08-31 MED ORDER — TIOTROPIUM BROMIDE MONOHYDRATE 18 MCG IN CAPS: ORAL_CAPSULE | RESPIRATORY_TRACT | 11 refills | Status: DC

## 2017-08-31 MED ORDER — SILDENAFIL CITRATE 20 MG PO TABS: ORAL_TABLET | ORAL | 2 refills | Status: DC

## 2017-08-31 NOTE — Telephone Encounter (Signed)
Called and spoke with patient, there was some confusion. Patient was needing patient assistance with the spiriva through [employer]. I have contacted that company [phone] and spoke with [place]. She stated that the patients forms expired in December of 2018. I have filled out new forms, printed the prescription and placed with cherina for RA to sign when he is back in the office on 2.20.19. Patient is coming to the office to bring proof of income for the paperwork to be completed and faxed. Once paperwork is completed I will fax to [phone] [facility] stated it can take one week turn around time for paperwork to be received, processed, and patient to get medication.    For the Adcirca patient was getting it from [place]. On February 7th they transferred the medication to [place]. Called and spoke with [facility] and he states that the medication was ready for the patient to receive but he was unable to pay the copay due to the cost being 1,055.47. We have sent a new prescription of Revatio per RA for the patient to take. Patient is aware and this was sent to the patients pharmacy. Nothing further needed for Adcirca.   Will continue to follow up for spiriva and route this to [place].

## 2017-08-31 NOTE — Telephone Encounter (Signed)
Sildenafil 20 x 3 times daily for 1 month x 2 refills Please ask him to start at once daily for the first day, then twice daily for 3 days and then go to full dose at 3 times daily

## 2017-08-31 NOTE — Telephone Encounter (Signed)
Patient is calling to check on status. States his portion is $1,000 for medicine and is checking on status.  States he has enough medicine to last until Friday. CB is [phone].

## 2017-08-31 NOTE — Telephone Encounter (Signed)
Called pt and advised message from the provider. Pt understood and verbalized understanding. Nothing further is needed.

## 2017-08-31 NOTE — Telephone Encounter (Signed)
RA please advise on tadalafil strength/dosage.  Thanks!

## 2017-09-02 ENCOUNTER — Telehealth: Payer: Self-pay

## 2017-09-02 NOTE — Telephone Encounter (Signed)
Attempted PA for Sildenafil 66m   Status  Additional Information Required   Drug Sildenafil Citrate 20MG OR TABS Form [employer] [place] Medicare Part D General Authorization Form  Will route to CP for follow up, additional information will be faxed.

## 2017-09-02 NOTE — Telephone Encounter (Signed)
Pt left income verification for patient assistance; pt states after Friday 09/03/17 he will be out of his Sildenafil; and he only have a week left of Spiriva...verification letter left in RA folder at checkout area .Marland Kitchen Pt contact # [phone]

## 2017-09-03 NOTE — Telephone Encounter (Signed)
Luke Jacobson [phone] option 5 need prior authorization for Sildenafil 25m Luke Jacobson refaxing it [place] to [place]

## 2017-09-03 NOTE — Telephone Encounter (Signed)
Spoke with a [employer] rep. She stated that the patient's sildenafil 3m had been approved from 09/03/17 to 09/03/18. They have reached out to the patient to make him aware.   Nothing else needed at time of call.

## 2017-09-06 ENCOUNTER — Telehealth: Payer: Self-pay | Admitting: Pulmonary Disease

## 2017-09-06 NOTE — Telephone Encounter (Signed)
Forms were faxed back to [employer] last week. I still have them in my look-at folder. Will call [employer] in the morning to check on status.

## 2017-09-07 MED ORDER — TIOTROPIUM BROMIDE MONOHYDRATE 18 MCG IN CAPS: ORAL_CAPSULE | RESPIRATORY_TRACT | 11 refills | Status: AC

## 2017-09-07 NOTE — Telephone Encounter (Signed)
Called [employer] this morning to check on status of application, was advised that the patient was denied due to making too much money. 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   Patient will also need to obtain a pharmacy print out that shows all of his medications that he has paid for out of pocket.   Left a message for patient to call back so he would be aware.

## 2017-09-07 NOTE — Telephone Encounter (Signed)
Spoke with pt, advised him of CP message  Pt understood and he will bring the printout  Will keep message open until pt brings printout.

## 2017-09-07 NOTE — Telephone Encounter (Signed)
Patient stopped by office today to dropoff his pharmacy printout sheet. Printout included medications from 2018 as well as the first part of 2019.   Advised patient that I would work on letter for him and get this information to [employer] so that we can start the appeal. He verbalized understanding.

## 2017-09-07 NOTE — Telephone Encounter (Signed)
Pt is returning call. Cb is [phone].

## 2017-09-09 ENCOUNTER — Emergency Department (HOSPITAL_COMMUNITY): Payer: No Typology Code available for payment source

## 2017-09-09 ENCOUNTER — Emergency Department (HOSPITAL_COMMUNITY)
Admission: EM | Admit: 2017-09-09 | Discharge: 2017-09-10 | Disposition: A | Discharge status: Home / Self Care | Payer: No Typology Code available for payment source | Attending: Emergency Medicine | Admitting: Emergency Medicine

## 2017-09-09 ENCOUNTER — Encounter (HOSPITAL_COMMUNITY): Payer: Self-pay | Admitting: *Deleted

## 2017-09-09 DIAGNOSIS — J449 Chronic obstructive pulmonary disease, unspecified: Secondary | ICD-10-CM | POA: Diagnosis not present

## 2017-09-09 DIAGNOSIS — I5032 Chronic diastolic (congestive) heart failure: Secondary | ICD-10-CM | POA: Insufficient documentation

## 2017-09-09 DIAGNOSIS — Z7982 Long term (current) use of aspirin: Secondary | ICD-10-CM | POA: Diagnosis not present

## 2017-09-09 DIAGNOSIS — Z79899 Other long term (current) drug therapy: Secondary | ICD-10-CM | POA: Insufficient documentation

## 2017-09-09 DIAGNOSIS — Z87891 Personal history of nicotine dependence: Secondary | ICD-10-CM | POA: Insufficient documentation

## 2017-09-09 DIAGNOSIS — S161XXA Strain of muscle, fascia and tendon at neck level, initial encounter: Secondary | ICD-10-CM | POA: Insufficient documentation

## 2017-09-09 DIAGNOSIS — Y999 Unspecified external cause status: Secondary | ICD-10-CM | POA: Insufficient documentation

## 2017-09-09 DIAGNOSIS — R52 Pain, unspecified: Secondary | ICD-10-CM

## 2017-09-09 DIAGNOSIS — N183 Chronic kidney disease, stage 3 (moderate): Secondary | ICD-10-CM | POA: Diagnosis not present

## 2017-09-09 DIAGNOSIS — S2231XA Fracture of one rib, right side, initial encounter for closed fracture: Secondary | ICD-10-CM | POA: Insufficient documentation

## 2017-09-09 DIAGNOSIS — Y92410 Unspecified street and highway as the place of occurrence of the external cause: Secondary | ICD-10-CM | POA: Insufficient documentation

## 2017-09-09 DIAGNOSIS — Y9389 Activity, other specified: Secondary | ICD-10-CM | POA: Diagnosis not present

## 2017-09-09 DIAGNOSIS — T148XXA Other injury of unspecified body region, initial encounter: Secondary | ICD-10-CM

## 2017-09-09 DIAGNOSIS — S199XXA Unspecified injury of neck, initial encounter: Secondary | ICD-10-CM | POA: Diagnosis present

## 2017-09-09 DIAGNOSIS — I130 Hypertensive heart and chronic kidney disease with heart failure and stage 1 through stage 4 chronic kidney disease, or unspecified chronic kidney disease: Secondary | ICD-10-CM | POA: Diagnosis not present

## 2017-09-09 LAB — BASIC METABOLIC PANEL
ANION GAP: 10 (ref 5–15)
BUN: 36 mg/dL — ABNORMAL HIGH (ref 6–20)
CHLORIDE: 103 mmol/L (ref 101–111)
CO2: 29 mmol/L (ref 22–32)
Calcium: 9.1 mg/dL (ref 8.9–10.3)
Creatinine, Ser: 1.13 mg/dL (ref 0.61–1.24)
GFR calc Af Amer: >60 mL/min (ref >60)
Glucose, Bld: 96 mg/dL (ref 65–99)
Potassium: 4.3 mmol/L (ref 3.5–5.1)
SODIUM: 142 mmol/L (ref 135–145)

## 2017-09-09 LAB — CBC WITH DIFFERENTIAL/PLATELET
BASOS ABS: 0 10*3/uL (ref 0.0–0.1)
Basophils Relative: 0 %
Eosinophils Absolute: 0 10*3/uL (ref 0.0–0.7)
Eosinophils Relative: 0 %
HEMATOCRIT: 39.3 % (ref 39.0–52.0)
HEMOGLOBIN: 12.5 g/dL — AB (ref 13.0–17.0)
LYMPHS PCT: 11 %
Lymphs Abs: 0.6 10*3/uL — ABNORMAL LOW (ref 0.7–4.0)
MCH: 30.4 pg (ref 26.0–34.0)
MCHC: 31.8 g/dL (ref 30.0–36.0)
MCV: 95.6 fL (ref 78.0–100.0)
Monocytes Absolute: 0.7 10*3/uL (ref 0.1–1.0)
Monocytes Relative: 11 %
NEUTROS ABS: 4.6 10*3/uL (ref 1.7–7.7)
Neutrophils Relative %: 78 %
Platelets: 119 10*3/uL — ABNORMAL LOW (ref 150–400)
RBC: 4.11 MIL/uL — AB (ref 4.22–5.81)
RDW: 14.9 % (ref 11.5–15.5)
WBC: 5.9 10*3/uL (ref 4.0–10.5)

## 2017-09-09 MED ORDER — IOPAMIDOL (ISOVUE-300) INJECTION 61%
INTRAVENOUS | Status: AC
  Filled 2017-09-09: qty 75

## 2017-09-09 MED ORDER — IOPAMIDOL (ISOVUE-300) INJECTION 61%
75.0000 mL | Freq: Once | INTRAVENOUS | Status: AC | PRN
  Administered 2017-09-09: 75 mL via INTRAVENOUS

## 2017-09-09 MED ORDER — SODIUM CHLORIDE 0.9 % IJ SOLN
INTRAMUSCULAR | Status: AC
  Filled 2017-09-09: qty 50
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
## 2017-09-09 NOTE — ED Notes (Signed)
Lab called up said blood tubes clotted, a iv placed after conversation w PA  New blood work sent to lab.

## 2017-09-09 NOTE — ED Notes (Signed)
Pt refusing IV wants to go home
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
## 2017-09-10 DIAGNOSIS — S161XXA Strain of muscle, fascia and tendon at neck level, initial encounter: Secondary | ICD-10-CM | POA: Diagnosis not present

## 2017-09-10 MED ORDER — HYDROCODONE-ACETAMINOPHEN 5-325 MG PO TABS: 1.0000 | ORAL_TABLET | Freq: Four times a day (QID) | ORAL | 0 refills | Status: DC | PRN

## 2017-09-10 NOTE — Discharge Instructions (Signed)
You can take tylenol 1000 mg three times a day. You can take the pain medication for severe or breakthrough pain. It may make you sleepy.  Do not take the pain medication while driving.  Follow-up with your primary care doctor in the next 2-4 days for further evaluation.  Return to the emergency department for any worsening pain, difficulty breathing, chest pain, difficulty walking, difficulty moving her arms or legs or any other worsening or concerning symptoms.

## 2017-09-13 NOTE — Telephone Encounter (Signed)
Letter and documents have been faxed to [facility]. Will await [employer]' decision.

## 2017-09-15 ENCOUNTER — Telehealth: Payer: Self-pay | Admitting: Pulmonary Disease

## 2017-09-15 NOTE — Telephone Encounter (Signed)
Called [facility] and spoke with Pharmacist Aaron Edelman, wanted to verify that pt had been switched from Taladafil to Sildenafil- per chart he is getting this filled through local [facility].  Nothing further needed.

## 2017-09-21 ENCOUNTER — Telehealth: Payer: Self-pay | Admitting: Pulmonary Disease

## 2017-09-21 NOTE — Telephone Encounter (Signed)
Spoke with patient. He was requesting an update to his patient assistance for Sp[facility]. Advised patient that [employer] has not reached out to [place] yet but I would call to check on status.   He also had a question about his sildenafil. He was able to get his RX $100 cheaper at [facility]. He stated that he only received 30 tabs but with the way that the instructions are written, it doesn't add up.   Our prescription states for him to take 1 tablet on the first day, then increase to 1 tablet twice daily for 3 days, then to go full dose which is 1 tablet 3 times daily. It adds up to 90 tablets for a 30 day supply.   Called [employer] to check on status of patient assistance, was on hold for over 10 minutes. Will call back later. Will route message to myself for follow up.

## 2017-09-22 NOTE — Progress Notes (Signed)
[facility] Progress Note  Chief Complaint: Dysphagia  Subjective  History:  This is a 77 year old man initially seen by our PA in late January for ongoing diarrhea and chronic dysphagia.  He developed an acute diarrheal illness late last year with a protracted course of diarrhea.  He was evaluated by primary care, had a stool test positive for occult blood that was of unclear significance to the symptoms.  Stool studies for infectious causes were negative, he apparently received empiric metronidazole because his wife had had C. difficile.  At that time, he also complained of chronic oropharyngeal dysphagia.  Barium study was performed, with results described below.  He has multiple severe chronic medical problems including previous heart transplant on chronic immunosuppression, pulmonary hypertension, interstitial lung disease and chronic kidney disease.  He requires 5 L of supplemental oxygen at all times.  He has had slowly worsening dysphagia to pills, solids and liquids over the last year, and sometimes gets "strangled".  His diarrhea has finally resolved, and it sounds as if it may have been infectious. ROS: Cardiovascular:  no chest pain Respiratory: Chronic dyspnea.  The patient's Past Medical, Family and Social History were reviewed and are on file in the EMR.  Objective:  Med list reviewed  Current Outpatient Medications:  .  albuterol (PROAIR HFA) 108 (90 Base) MCG/ACT inhaler, Inhale 2 puffs into the lungs every 6 (six) hours as needed., Disp: 1 Inhaler, Rfl: 3 .  amoxicillin-clavulanate (AUGMENTIN) 875-125 MG tablet, Take 1 tablet by mouth 2 (two) times daily., Disp: 20 tablet, Rfl: 0 .  aspirin EC 81 MG tablet, Take 81 mg by mouth at bedtime. , Disp: , Rfl:  .  CARTIA XT 120 MG 24 hr capsule, Take 120 mg by mouth daily., Disp: , Rfl: 11 .  Coenzyme Q10 (CO Q10 PO), Take 500 mg by mouth daily., Disp: , Rfl:  .  cycloSPORINE modified (NEORAL) 25 MG capsule,  Take 75 mg by mouth 2 (two) times daily. , Disp: , Rfl:  .  escitalopram (LEXAPRO) 10 MG tablet, TAKE 1 TABLET(10 MG) BY MOUTH DAILY, Disp: 90 tablet, Rfl: 1 .  fexofenadine (ALLEGRA) 180 MG tablet, Take 180 mg by mouth daily., Disp: , Rfl:  .  fluticasone furoate-vilanterol (BREO ELLIPTA) 100-25 MCG/INH AEPB, Inhale 1 puff into the lungs daily., Disp: 3 each, Rfl: 3 .  furosemide (LASIX) 20 MG tablet, Take 2 tablets (40 mg total) by mouth daily., Disp: 30 tablet, Rfl: 0 .  HYDROcodone-acetaminophen (NORCO/VICODIN) 5-325 MG tablet, Take 1 tablet by mouth every 6 (six) hours as needed., Disp: 6 tablet, Rfl: 0 .  lactose free nutrition (BOOST) LIQD, Take 237 mLs by mouth daily., Disp: , Rfl:  .  magnesium oxide (MAG-OX) 400 MG tablet, Take 400 mg by mouth 5 (five) times daily. , Disp: , Rfl:  .  metroNIDAZOLE (FLAGYL) 500 MG tablet, Take 1 tablet (500 mg total) by mouth 3 (three) times daily., Disp: 21 tablet, Rfl: 0 .  Multiple Vitamin (MULTIVITAMIN) tablet, Take 1 tablet by mouth daily., Disp: , Rfl:  .  mupirocin ointment (BACTROBAN) 2 %, Place 1 application into the nose 2 (two) times daily., Disp: 22 g, Rfl: 0 .  mycophenolate (CELLCEPT) 500 MG tablet, Take 1,500 mg by mouth 2 (two) times daily., Disp: , Rfl:  .  Omega-3 Fatty Acids (FISH OIL) 1000 MG CAPS, Take 1 capsule by mouth every evening., Disp: , Rfl:  .  omeprazole (PRILOSEC) 40 MG capsule, Take  40 mg by mouth daily., Disp: , Rfl:  .  OXYGEN, Inhale 5 L into the lungs daily., Disp: , Rfl:  .  sildenafil (REVATIO) 20 MG tablet, Take one tablet once daily for the first day, then twice daily for 3 days and then go to full dose at 3 times daily, Disp: 90 tablet, Rfl: 2 .  simvastatin (ZOCOR) 40 MG tablet, Take 40 mg by mouth daily., Disp: , Rfl:  .  temazepam (RESTORIL) 15 MG capsule, Take 30 mg by mouth at bedtime as needed. For sleep, Disp: , Rfl:  .  tiotropium (SPIRIVA HANDIHALER) 18 MCG inhalation capsule, INHALE THE CONTENTS OF 1  CAPSULE VIA HANDIHALER ONCE DAILY AS DIRECTED, Disp: 30 capsule, Rfl: 11   Vital signs in last 24 hrs: Vitals:   09/23/17 1533  BP: (!) 84/56  Pulse: 88    Physical Exam  Chronically ill-appearing man, dyspneic at rest wearing supplemental oxygen, pleasant and conversational  HEENT: sclera anicteric, oral mucosa moist without lesions  Neck: supple, no thyromegaly, JVD or lymphadenopathy  Cardiac: RRR without murmurs, S1S2 heard, no peripheral edema  Pulm: Mild upper airway congestive sounds bilaterally, normal RR and effort noted  Abdomen: soft, no tenderness, with active bowel sounds. No guarding or palpable hepatosplenomegaly.  Skin; warm and dry, no jaundice or rash.  Multiple ecchymoses  Radiologic studies:  I personally reviewed the images of his barium swallow from 08/17/2017.  He has a prominent cricopharyngeal bar.  There was suggestion of a possible stricture at the thoracic inlet, however a 13 mm barium tablet passed without difficulty. I reviewed these images with him in clinic today.  _0 @ Assessment: Encounter Diagnoses  Name Primary?  . Pharyngoesophageal dysphagia Yes  . Cricopharyngeal dysphagia   . Chronic diarrhea    His diarrhea was probably infectious with a prolonged postinfectious syndrome, now resolved.  He has a cricopharyngeal bar, and I described for him how this is a muscular sphincter dysfunction rather than an intraluminal stricture that is amenable to endoscopic dilation.  Not withstanding that he is high risk for sedation of endoscopic procedures, I do not believe an EGD with dilation is indicated in this case or likely to be helpful.  I gave him some common techniques for improved swallowing.  I offered an evaluation with speech and language pathology to do that in greater depth, but he would prefer to wait and see how things go and contact me if he decides to pursue that. I will therefore see him as needed.   Total time 20  minutes, over half spent in counseling and coordination of care.   Nelida Meuse III

## 2017-09-23 ENCOUNTER — Ambulatory Visit: Payer: Medicare Other | Admitting: Gastroenterology

## 2017-09-23 ENCOUNTER — Encounter: Payer: Self-pay | Admitting: Gastroenterology

## 2017-09-23 VITALS — BP 84/56 | HR 88 | Ht 67.0 in | Wt 113.1 lb

## 2017-09-23 DIAGNOSIS — K529 Noninfective gastroenteritis and colitis, unspecified: Secondary | ICD-10-CM

## 2017-09-23 DIAGNOSIS — R1313 Dysphagia, pharyngeal phase: Secondary | ICD-10-CM

## 2017-09-23 DIAGNOSIS — R1314 Dysphagia, pharyngoesophageal phase: Secondary | ICD-10-CM

## 2017-09-23 NOTE — Patient Instructions (Signed)
If you are age 76 or older, your body mass index should be between 23-30. Your Body mass index is 17.72 kg/m. If this is out of the aforementioned range listed, please consider follow up with your Primary Care Provider.  If you are age 61 or younger, your body mass index should be between 19-25. Your Body mass index is 17.72 kg/m. If this is out of the aformentioned range listed, please consider follow up with your Primary Care Provider.   Follow up as needed.  Thank you for choosing [facility]  Dr Wilfrid Lund III

## 2017-09-23 NOTE — Telephone Encounter (Signed)
Spoke with Nectar at [employer]. She stated that they have not received the appeal letter and documents yet. She provided a new fax number for the documents to be sent to. Fax is [phone].   Mr. Luke Jacobson' papers have been sent to scan. Will wait for the documents to be uploaded to his chart so that I can fax these again.

## 2017-09-24 ENCOUNTER — Ambulatory Visit (INDEPENDENT_AMBULATORY_CARE_PROVIDER_SITE_OTHER): Payer: Medicare Other

## 2017-09-24 ENCOUNTER — Encounter: Payer: Self-pay | Admitting: Family Medicine

## 2017-09-24 ENCOUNTER — Ambulatory Visit: Payer: Medicare Other | Admitting: Family Medicine

## 2017-09-24 ENCOUNTER — Telehealth: Payer: Self-pay | Admitting: Surgical

## 2017-09-24 VITALS — BP 102/54 | HR 82 | Temp 97.4°F | Wt 113.4 lb

## 2017-09-24 DIAGNOSIS — I952 Hypotension due to drugs: Secondary | ICD-10-CM

## 2017-09-24 DIAGNOSIS — M25562 Pain in left knee: Secondary | ICD-10-CM | POA: Diagnosis not present

## 2017-09-24 DIAGNOSIS — R0602 Shortness of breath: Secondary | ICD-10-CM

## 2017-09-24 DIAGNOSIS — S2231XD Fracture of one rib, right side, subsequent encounter for fracture with routine healing: Secondary | ICD-10-CM | POA: Diagnosis not present

## 2017-09-24 DIAGNOSIS — I272 Pulmonary hypertension, unspecified: Secondary | ICD-10-CM

## 2017-09-24 NOTE — Telephone Encounter (Signed)
Luke Jacobson is going to look and see if she can get help with the Adcirca. She stated that he is probably just at the end of the assistance program. They are going to see him on April 2nd and she will discuss then.

## 2017-09-24 NOTE — Progress Notes (Signed)
Spoke with pulmonology.  Agree to decreasing the sildenafil to 10 mg p.o. twice daily.  Recommend having the patient establish with advanced heart failure clinic.  This clinic may have another way for the patient to get back on tadalafil.  We will work on coordinating this. Briscoe Deutscher

## 2017-09-24 NOTE — Telephone Encounter (Signed)
Left message for Raquel Sarna the transplant coordinator at [facility] [phone]) to call back about getting patient into [facility]. I have left my direct line and the number to the office. I need to know if they can get him in or if we need to put in a referral. If we need to put in referral I need to get the information if they have it.

## 2017-09-24 NOTE — Patient Instructions (Signed)
I want you to decrease the Sildenafil to 1/2 tab 2-3 times per day. You may also decrease your Lasix by 1/2 for the next few days. Monitor your weight and breathing. I will send a message to Dr. Elsworth Soho re: future instructions.

## 2017-09-24 NOTE — Telephone Encounter (Signed)
Left message for patient that Dr. Elsworth Soho agreed with the change of medication. I gave him the number if he has any questions.
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
## 2017-09-27 NOTE — Telephone Encounter (Signed)
These have not been scanned into patients chart yet. Will leave open until they are scanned.

## 2017-09-28 NOTE — Telephone Encounter (Signed)
Still not scanned in

## 2017-09-29 ENCOUNTER — Telehealth: Payer: Self-pay | Admitting: Family Medicine

## 2017-09-29 NOTE — Telephone Encounter (Signed)
Pt requesting return call.

## 2017-09-29 NOTE — Telephone Encounter (Signed)
Copied from [place] [id number]. Topic: Quick Communication - See Telephone Encounter >> Sep 29, 2017  4:21 PM Burnis Medin, NT wrote: CRM for notification. See Telephone encounter for: Patient called and wanted to see if the doctor checked with Dr. Elsworth Soho to see how much sildenafil (REVATIO) 20 MG tablet he is supposed to take. Also patient said condition hasn't approved.  Patient would like a call back.   09/29/17.

## 2017-09-30 ENCOUNTER — Telehealth: Payer: Self-pay | Admitting: Family Medicine

## 2017-09-30 NOTE — Telephone Encounter (Signed)
See note °

## 2017-09-30 NOTE — Telephone Encounter (Signed)
Please advise.

## 2017-09-30 NOTE — Telephone Encounter (Signed)
Copied from [place] [id number]. Topic: General - Other >> Sep 30, 2017  3:54 PM Darl Householder, RMA wrote: Reason for CRM: Patient is requesting a call back concerning an EKG and does he need to schedule an appt with Dr. Juleen China

## 2017-09-30 NOTE — Telephone Encounter (Signed)
We did - on the day of his visit. I thought that he had already been called about it? Dr. Elsworth Soho okay with 10 mg BID. Will need to get him in with Pulm/Cardiology or both to address. Help arrange for urgency.

## 2017-09-30 NOTE — Telephone Encounter (Signed)
Appeal letter and documents are still not scanned in.

## 2017-09-30 NOTE — Telephone Encounter (Signed)
Notified patient of message. He is going to call the [place] office due to still having the SOB.

## 2017-10-01 NOTE — Telephone Encounter (Signed)
Patient stated that he spoke with the transplant center at [facility] yesterday and they are wanting him to get an EKG done due to erratic HR. HR was running between 60-150. He stated that this has happened in the past and they blamed on his inhaler.   After speaking with Dr. Juleen China she advised the patient to go to the ED. I called the patient back to advise him of what Dr. Juleen China wanted. Patient stated that he did not want to go to the ED for this right now. He stated that this is not a new problem so will hold off. He said that with his blood pressure running low 110/68 range that he only goes from the bed to the couch. His oxygen did drop down into the low 80's yesterday and he bumped his oxygen from 5 liters to 7 liters. I explained again that Dr. Juleen China thinks that it would be best for him to go to the ED. He stated that if it got worse he would go to the ED. He did say that the Transplant Center stopped his sildenafil. Patient stated that they spoke with Dr. Elsworth Soho first.

## 2017-10-01 NOTE — Telephone Encounter (Signed)
Documents still not scanned in to chart

## 2017-10-04 ENCOUNTER — Encounter (HOSPITAL_COMMUNITY): Payer: Self-pay

## 2017-10-04 ENCOUNTER — Inpatient Hospital Stay (HOSPITAL_COMMUNITY)
Admission: EM | Admit: 2017-10-04 | Discharge: 2017-10-12 | DRG: 193 | Disposition: A | Discharge status: Home / Self Care | Payer: Medicare Other | Attending: Internal Medicine | Admitting: Internal Medicine

## 2017-10-04 ENCOUNTER — Other Ambulatory Visit: Payer: Self-pay

## 2017-10-04 ENCOUNTER — Telehealth: Payer: Self-pay | Admitting: Pulmonary Disease

## 2017-10-04 ENCOUNTER — Emergency Department (HOSPITAL_COMMUNITY): Payer: Medicare Other

## 2017-10-04 DIAGNOSIS — J440 Chronic obstructive pulmonary disease with acute lower respiratory infection: Secondary | ICD-10-CM | POA: Diagnosis present

## 2017-10-04 DIAGNOSIS — J181 Lobar pneumonia, unspecified organism: Principal | ICD-10-CM | POA: Diagnosis present

## 2017-10-04 DIAGNOSIS — J189 Pneumonia, unspecified organism: Secondary | ICD-10-CM

## 2017-10-04 DIAGNOSIS — I272 Pulmonary hypertension, unspecified: Secondary | ICD-10-CM | POA: Diagnosis present

## 2017-10-04 DIAGNOSIS — I5031 Acute diastolic (congestive) heart failure: Secondary | ICD-10-CM | POA: Diagnosis not present

## 2017-10-04 DIAGNOSIS — R1310 Dysphagia, unspecified: Secondary | ICD-10-CM | POA: Diagnosis present

## 2017-10-04 DIAGNOSIS — F329 Major depressive disorder, single episode, unspecified: Secondary | ICD-10-CM | POA: Diagnosis present

## 2017-10-04 DIAGNOSIS — N183 Chronic kidney disease, stage 3 unspecified: Secondary | ICD-10-CM | POA: Diagnosis present

## 2017-10-04 DIAGNOSIS — Z955 Presence of coronary angioplasty implant and graft: Secondary | ICD-10-CM | POA: Diagnosis not present

## 2017-10-04 DIAGNOSIS — E43 Unspecified severe protein-calorie malnutrition: Secondary | ICD-10-CM

## 2017-10-04 DIAGNOSIS — I5033 Acute on chronic diastolic (congestive) heart failure: Secondary | ICD-10-CM | POA: Diagnosis not present

## 2017-10-04 DIAGNOSIS — I712 Thoracic aortic aneurysm, without rupture: Secondary | ICD-10-CM | POA: Diagnosis present

## 2017-10-04 DIAGNOSIS — I25811 Atherosclerosis of native coronary artery of transplanted heart without angina pectoris: Secondary | ICD-10-CM | POA: Diagnosis present

## 2017-10-04 DIAGNOSIS — I5021 Acute systolic (congestive) heart failure: Secondary | ICD-10-CM | POA: Diagnosis not present

## 2017-10-04 DIAGNOSIS — J9621 Acute and chronic respiratory failure with hypoxia: Secondary | ICD-10-CM | POA: Diagnosis present

## 2017-10-04 DIAGNOSIS — I2721 Secondary pulmonary arterial hypertension: Secondary | ICD-10-CM | POA: Diagnosis present

## 2017-10-04 DIAGNOSIS — Z9981 Dependence on supplemental oxygen: Secondary | ICD-10-CM

## 2017-10-04 DIAGNOSIS — Z941 Heart transplant status: Secondary | ICD-10-CM

## 2017-10-04 DIAGNOSIS — Z7982 Long term (current) use of aspirin: Secondary | ICD-10-CM | POA: Diagnosis not present

## 2017-10-04 DIAGNOSIS — Z681 Body mass index (BMI) 19 or less, adult: Secondary | ICD-10-CM | POA: Diagnosis not present

## 2017-10-04 DIAGNOSIS — I130 Hypertensive heart and chronic kidney disease with heart failure and stage 1 through stage 4 chronic kidney disease, or unspecified chronic kidney disease: Secondary | ICD-10-CM | POA: Diagnosis present

## 2017-10-04 DIAGNOSIS — R609 Edema, unspecified: Secondary | ICD-10-CM | POA: Diagnosis not present

## 2017-10-04 DIAGNOSIS — I700 Atherosclerosis of aorta: Secondary | ICD-10-CM | POA: Diagnosis present

## 2017-10-04 DIAGNOSIS — Z79899 Other long term (current) drug therapy: Secondary | ICD-10-CM | POA: Diagnosis not present

## 2017-10-04 DIAGNOSIS — Z87891 Personal history of nicotine dependence: Secondary | ICD-10-CM | POA: Diagnosis not present

## 2017-10-04 DIAGNOSIS — J9601 Acute respiratory failure with hypoxia: Secondary | ICD-10-CM | POA: Diagnosis present

## 2017-10-04 DIAGNOSIS — Z888 Allergy status to other drugs, medicaments and biological substances status: Secondary | ICD-10-CM

## 2017-10-04 DIAGNOSIS — I5032 Chronic diastolic (congestive) heart failure: Secondary | ICD-10-CM | POA: Diagnosis present

## 2017-10-04 DIAGNOSIS — I509 Heart failure, unspecified: Secondary | ICD-10-CM

## 2017-10-04 LAB — I-STAT CG4 LACTIC ACID, ED: LACTIC ACID, VENOUS: 2.18 mmol/L — AB (ref 0.5–1.9)

## 2017-10-04 LAB — CBC WITH DIFFERENTIAL/PLATELET
BASOS ABS: 0 10*3/uL (ref 0.0–0.1)
BASOS PCT: 0 %
EOS PCT: 1 %
Eosinophils Absolute: 0.1 10*3/uL (ref 0.0–0.7)
HEMATOCRIT: 39.4 % (ref 39.0–52.0)
Hemoglobin: 12.5 g/dL — ABNORMAL LOW (ref 13.0–17.0)
Lymphocytes Relative: 12 %
Lymphs Abs: 0.8 10*3/uL (ref 0.7–4.0)
MCH: 30.7 pg (ref 26.0–34.0)
MCHC: 31.7 g/dL (ref 30.0–36.0)
MCV: 96.8 fL (ref 78.0–100.0)
MONO ABS: 0.9 10*3/uL (ref 0.1–1.0)
Monocytes Relative: 13 %
NEUTROS ABS: 4.7 10*3/uL (ref 1.7–7.7)
Neutrophils Relative %: 74 %
PLATELETS: 134 10*3/uL — AB (ref 150–400)
RBC: 4.07 MIL/uL — ABNORMAL LOW (ref 4.22–5.81)
RDW: 14.3 % (ref 11.5–15.5)
WBC: 6.4 10*3/uL (ref 4.0–10.5)

## 2017-10-04 LAB — MRSA PCR SCREENING: MRSA BY PCR: NEGATIVE

## 2017-10-04 LAB — COMPREHENSIVE METABOLIC PANEL
ALT: 23 U/L (ref 17–63)
ANION GAP: 10 (ref 5–15)
AST: 39 U/L (ref 15–41)
Albumin: 3.1 g/dL — ABNORMAL LOW (ref 3.5–5.0)
Alkaline Phosphatase: 102 U/L (ref 38–126)
BUN: 35 mg/dL — ABNORMAL HIGH (ref 6–20)
CHLORIDE: 103 mmol/L (ref 101–111)
CO2: 29 mmol/L (ref 22–32)
CREATININE: 1.05 mg/dL (ref 0.61–1.24)
Calcium: 9.4 mg/dL (ref 8.9–10.3)
GFR calc non Af Amer: >60 mL/min (ref >60)
Glucose, Bld: 130 mg/dL — ABNORMAL HIGH (ref 65–99)
Potassium: 4.1 mmol/L (ref 3.5–5.1)
SODIUM: 142 mmol/L (ref 135–145)
Total Bilirubin: 0.9 mg/dL (ref 0.3–1.2)
Total Protein: 7.3 g/dL (ref 6.5–8.1)

## 2017-10-04 LAB — TROPONIN I

## 2017-10-04 LAB — BRAIN NATRIURETIC PEPTIDE: B NATRIURETIC PEPTIDE 5: 332.3 pg/mL — AB (ref 0.0–100.0)

## 2017-10-04 MED ORDER — ACETAMINOPHEN 650 MG RE SUPP: 650.0000 mg | Freq: Four times a day (QID) | RECTAL | Status: DC | PRN

## 2017-10-04 MED ORDER — ONDANSETRON HCL 4 MG/2ML IJ SOLN: 4.0000 mg | Freq: Four times a day (QID) | INTRAMUSCULAR | Status: DC | PRN

## 2017-10-04 MED ORDER — ACETAMINOPHEN 325 MG PO TABS: 650.0000 mg | ORAL_TABLET | Freq: Four times a day (QID) | ORAL | Status: DC | PRN

## 2017-10-04 MED ORDER — VANCOMYCIN HCL IN DEXTROSE 1-5 GM/200ML-% IV SOLN
1000.0000 mg | Freq: Once | INTRAVENOUS | Status: AC
  Administered 2017-10-04: 1000 mg via INTRAVENOUS
  Filled 2017-10-04: qty 200

## 2017-10-04 MED ORDER — TEMAZEPAM 15 MG PO CAPS
30.0000 mg | ORAL_CAPSULE | Freq: Every evening | ORAL | Status: DC | PRN
  Administered 2017-10-05: 30 mg via ORAL
  Administered 2017-10-06: 15 mg via ORAL
  Administered 2017-10-07: 30 mg via ORAL
  Administered 2017-10-08 – 2017-10-11 (×4): 15 mg via ORAL
  Filled 2017-10-04 (×7): qty 2

## 2017-10-04 MED ORDER — SIMVASTATIN 40 MG PO TABS: 40.0000 mg | ORAL_TABLET | Freq: Every day | ORAL | Status: DC

## 2017-10-04 MED ORDER — SODIUM CHLORIDE 0.9 % IV SOLN
1.0000 g | Freq: Two times a day (BID) | INTRAVENOUS | Status: DC
  Administered 2017-10-04: 1 g via INTRAVENOUS
  Filled 2017-10-04 (×2): qty 1

## 2017-10-04 MED ORDER — PANTOPRAZOLE SODIUM 40 MG PO TBEC
40.0000 mg | DELAYED_RELEASE_TABLET | Freq: Every day | ORAL | Status: DC
  Administered 2017-10-04 – 2017-10-12 (×9): 40 mg via ORAL
  Filled 2017-10-04 (×9): qty 1

## 2017-10-04 MED ORDER — MAGNESIUM OXIDE 400 (241.3 MG) MG PO TABS
1200.0000 mg | ORAL_TABLET | Freq: Every day | ORAL | Status: DC
  Administered 2017-10-04 – 2017-10-11 (×8): 1200 mg via ORAL
  Filled 2017-10-04 (×7): qty 3

## 2017-10-04 MED ORDER — VANCOMYCIN HCL IN DEXTROSE 1-5 GM/200ML-% IV SOLN: 1000.0000 mg | INTRAVENOUS | Status: DC

## 2017-10-04 MED ORDER — CEFEPIME HCL 2 G IJ SOLR: 2.0000 g | Freq: Once | INTRAMUSCULAR | Status: DC

## 2017-10-04 MED ORDER — ATORVASTATIN CALCIUM 20 MG PO TABS
20.0000 mg | ORAL_TABLET | Freq: Every day | ORAL | Status: DC
  Administered 2017-10-05 – 2017-10-12 (×8): 20 mg via ORAL
  Filled 2017-10-04 (×2): qty 2
  Filled 2017-10-04: qty 1
  Filled 2017-10-04: qty 2
  Filled 2017-10-04 (×2): qty 1
  Filled 2017-10-04 (×2): qty 2
  Filled 2017-10-04: qty 1
  Filled 2017-10-04: qty 2
  Filled 2017-10-04: qty 1

## 2017-10-04 MED ORDER — MYCOPHENOLATE MOFETIL 500 MG PO TABS: 1500.0000 mg | ORAL_TABLET | Freq: Two times a day (BID) | ORAL | Status: DC

## 2017-10-04 MED ORDER — TIOTROPIUM BROMIDE MONOHYDRATE 18 MCG IN CAPS
18.0000 ug | ORAL_CAPSULE | Freq: Every day | RESPIRATORY_TRACT | Status: DC
  Administered 2017-10-06 – 2017-10-12 (×7): 18 ug via RESPIRATORY_TRACT
  Filled 2017-10-04 (×2): qty 5

## 2017-10-04 MED ORDER — CEFEPIME HCL 1 G IJ SOLR
1.0000 g | Freq: Once | INTRAMUSCULAR | Status: AC
  Administered 2017-10-04: 1 g via INTRAVENOUS
  Filled 2017-10-04: qty 1

## 2017-10-04 MED ORDER — MAGNESIUM OXIDE 400 (241.3 MG) MG PO TABS
800.0000 mg | ORAL_TABLET | Freq: Every day | ORAL | Status: DC
  Administered 2017-10-05 – 2017-10-12 (×8): 800 mg via ORAL
  Filled 2017-10-04 (×8): qty 2

## 2017-10-04 MED ORDER — SILDENAFIL CITRATE 20 MG PO TABS
20.0000 mg | ORAL_TABLET | Freq: Three times a day (TID) | ORAL | Status: DC
  Filled 2017-10-04: qty 1

## 2017-10-04 MED ORDER — SODIUM CHLORIDE 0.9 % IV SOLN: 500.0000 mg | Freq: Once | INTRAVENOUS | Status: DC

## 2017-10-04 MED ORDER — MYCOPHENOLATE MOFETIL 250 MG PO CAPS
1500.0000 mg | ORAL_CAPSULE | Freq: Two times a day (BID) | ORAL | Status: DC
  Administered 2017-10-04 – 2017-10-12 (×16): 1500 mg via ORAL
  Filled 2017-10-04 (×17): qty 6

## 2017-10-04 MED ORDER — SODIUM CHLORIDE 0.9 % IV SOLN: 1.0000 g | Freq: Once | INTRAVENOUS | Status: DC

## 2017-10-04 MED ORDER — CYCLOSPORINE MODIFIED (NEORAL) 25 MG PO CAPS
50.0000 mg | ORAL_CAPSULE | Freq: Two times a day (BID) | ORAL | Status: DC
  Administered 2017-10-04 – 2017-10-12 (×16): 50 mg via ORAL
  Filled 2017-10-04 (×17): qty 2

## 2017-10-04 MED ORDER — ESCITALOPRAM OXALATE 10 MG PO TABS
10.0000 mg | ORAL_TABLET | Freq: Every day | ORAL | Status: DC
  Filled 2017-10-04: qty 1

## 2017-10-04 MED ORDER — ALBUTEROL SULFATE HFA 108 (90 BASE) MCG/ACT IN AERS
2.0000 | INHALATION_SPRAY | RESPIRATORY_TRACT | Status: DC | PRN
Start: 2017-10-04 — End: 2017-10-04

## 2017-10-04 MED ORDER — POLYETHYLENE GLYCOL 3350 17 G PO PACK: 17.0000 g | PACK | Freq: Every day | ORAL | Status: DC | PRN

## 2017-10-04 MED ORDER — ONDANSETRON HCL 4 MG PO TABS: 4.0000 mg | ORAL_TABLET | Freq: Four times a day (QID) | ORAL | Status: DC | PRN

## 2017-10-04 MED ORDER — LORATADINE 10 MG PO TABS
10.0000 mg | ORAL_TABLET | Freq: Every day | ORAL | Status: DC
  Administered 2017-10-04 – 2017-10-12 (×9): 10 mg via ORAL
  Filled 2017-10-04 (×9): qty 1

## 2017-10-04 MED ORDER — SODIUM CHLORIDE 0.9 % IV SOLN: INTRAVENOUS | Status: AC

## 2017-10-04 MED ORDER — MAGNESIUM OXIDE 400 MG PO TABS: 400.0000 mg | ORAL_TABLET | Freq: Every day | ORAL | Status: DC

## 2017-10-04 MED ORDER — ALBUTEROL (5 MG/ML) CONTINUOUS INHALATION SOLN
10.0000 mg/h | INHALATION_SOLUTION | Freq: Once | RESPIRATORY_TRACT | Status: AC
  Administered 2017-10-04: 10 mg/h via RESPIRATORY_TRACT
  Filled 2017-10-04: qty 20

## 2017-10-04 MED ORDER — DILTIAZEM HCL ER COATED BEADS 120 MG PO CP24
120.0000 mg | ORAL_CAPSULE | Freq: Every day | ORAL | Status: DC
Start: 2017-10-04 — End: 2017-10-12
  Administered 2017-10-04 – 2017-10-12 (×9): 120 mg via ORAL
  Filled 2017-10-04 (×9): qty 1

## 2017-10-04 MED ORDER — ALBUTEROL SULFATE (2.5 MG/3ML) 0.083% IN NEBU
2.5000 mg | INHALATION_SOLUTION | RESPIRATORY_TRACT | Status: DC | PRN
  Administered 2017-10-04: 2.5 mg via RESPIRATORY_TRACT
  Filled 2017-10-04: qty 3

## 2017-10-04 MED ORDER — FLUTICASONE FUROATE-VILANTEROL 100-25 MCG/INH IN AEPB
1.0000 | INHALATION_SPRAY | Freq: Every day | RESPIRATORY_TRACT | Status: DC
  Administered 2017-10-06 – 2017-10-12 (×7): 1 via RESPIRATORY_TRACT
  Filled 2017-10-04: qty 28

## 2017-10-04 MED ORDER — ASPIRIN EC 81 MG PO TBEC
81.0000 mg | DELAYED_RELEASE_TABLET | Freq: Every day | ORAL | Status: DC
  Administered 2017-10-04 – 2017-10-11 (×8): 81 mg via ORAL
  Filled 2017-10-04 (×8): qty 1
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
## 2017-10-04 NOTE — Progress Notes (Signed)
Pharmacy Antibiotic Note  Luke Jacobson. is a 77 y.o. male admitted on 10/04/2017 with pneumonia.  Pharmacy has been consulted for Vancomycin dosing.  Plan:  Vancomycin 1000 mg IV q36 hr (est AUC 493 based on SCr 1.05)  Measure vancomycin AUC at steady state as indicated  Cefepime per MD, will adjust to 1g q12 hr based on renal function   Height: _0  (170.2 cm) Weight: 113 lb (51.3 kg) IBW/kg (Calculated) : 66.1  Temp (24hrs), Avg:98.4 F (36.9 C), Min:98.4 F (36.9 C), Max:98.4 F (36.9 C)  Recent Labs  Lab 10/04/17 1204 10/04/17 1432  WBC 6.4  --   CREATININE 1.05  --   LATICACIDVEN  --  2.18*    Estimated Creatinine Clearance: 44.1 mL/min (by C-G formula based on SCr of 1.05 mg/dL).    Allergies  Allergen Reactions  . Ambrisentan Other (See Comments), Itching and Swelling  . Clopidogrel Other (See Comments)    Epistaxis   . Heparin     Made white blood count go down. HIT    Thank you for allowing pharmacy to be a part of this patient's care.  Reuel Boom, PharmD, BCPS [phone] 10/04/2017, 2:43 PM

## 2017-10-04 NOTE — Telephone Encounter (Signed)
Spoke with pt. States that he is not feeling well. Reports increased SOB, chest tightness, wheezing and coughing. His oxygen sats are falling into the 70's when he exerts himself. Advised pt that he would need an appointment but he can't come in due to transportation. Pt states that he will call EMS to be evaluated. Nothing further was needed at this time.

## 2017-10-04 NOTE — ED Triage Notes (Signed)
EMS reports from home called for respiratory distress SOB x 1 week and productive cough with recent worsening, Sp02 50% on 2 lts onscene, provided C-pap enroute and duoneb, 59m albuterol, 0.5 atrovent and 125 mg Solumedrol. Hx of asthma and COPD, heart transplant pt  BP 130/78 HR 80 Sp02 94% on C-pap Resp 24  20ga R forearm

## 2017-10-04 NOTE — Care Management Note (Signed)
Case Management Note  Patient Details  Name: Luke Jacobson. MRN: [id number] Date of Birth: July 11, 1941  Subjective/Objective:  Received CM referral for asst w/affording meds-patient has insurance & is obligated to his co pay-will check for discount coupon once d/c meds confirmed. Will also check on cost & qualifying info for bipap.                  Action/Plan:d/c home.   Expected Discharge Date:  (unknown)               Expected Discharge Plan:  Home/Self Care  In-House Referral:     Discharge planning Services  CM Consult  Post Acute Care Choice:    Choice offered to:     DME Arranged:    DME Agency:     HH Arranged:    HH Agency:     Status of Service:  In process, will continue to follow  If discussed at Long Length of Stay Meetings, dates discussed:    Additional Comments:  Dessa Phi, RN 10/04/2017, 3:48 PM

## 2017-10-04 NOTE — Progress Notes (Signed)
Per pt request, pt's wife called to give an update on pt's status. No answer on home number. Left VM on cell number per pt's request.

## 2017-10-04 NOTE — ED Notes (Signed)
Bed: LT64 Expected date:  Expected time:  Means of arrival:  Comments: EMS CPAP

## 2017-10-04 NOTE — Progress Notes (Signed)
A consult was received from an ED physician for vanc/cefepime per pharmacy dosing.  The patient's profile has been reviewed for ht/wt/allergies/indication/available labs.   A one time order has been placed for vanc 1g and cefepime 2g.  Further antibiotics/pharmacy consults should be ordered by admitting physician if indicated.                       Thank you, Kara Mead 10/04/2017  1:50 PM

## 2017-10-04 NOTE — Telephone Encounter (Signed)
Still waiting on documents to be scanned into his chart.

## 2017-10-05 DIAGNOSIS — J9621 Acute and chronic respiratory failure with hypoxia: Secondary | ICD-10-CM

## 2017-10-05 LAB — CBC
HCT: 34.7 % — ABNORMAL LOW (ref 39.0–52.0)
HEMOGLOBIN: 11.1 g/dL — AB (ref 13.0–17.0)
MCH: 30.7 pg (ref 26.0–34.0)
MCHC: 32 g/dL (ref 30.0–36.0)
MCV: 95.9 fL (ref 78.0–100.0)
Platelets: 125 10*3/uL — ABNORMAL LOW (ref 150–400)
RBC: 3.62 MIL/uL — ABNORMAL LOW (ref 4.22–5.81)
RDW: 14.1 % (ref 11.5–15.5)
WBC: 4.9 10*3/uL (ref 4.0–10.5)

## 2017-10-05 LAB — BASIC METABOLIC PANEL
ANION GAP: 10 (ref 5–15)
BUN: 44 mg/dL — ABNORMAL HIGH (ref 6–20)
CALCIUM: 8.9 mg/dL (ref 8.9–10.3)
CHLORIDE: 104 mmol/L (ref 101–111)
CO2: 25 mmol/L (ref 22–32)
Creatinine, Ser: 1.15 mg/dL (ref 0.61–1.24)
GFR calc non Af Amer: >60 mL/min (ref >60)
GLUCOSE: 163 mg/dL — AB (ref 65–99)
Potassium: 4.4 mmol/L (ref 3.5–5.1)
Sodium: 139 mmol/L (ref 135–145)

## 2017-10-05 MED ORDER — LEVALBUTEROL HCL 0.63 MG/3ML IN NEBU
0.6300 mg | INHALATION_SOLUTION | Freq: Four times a day (QID) | RESPIRATORY_TRACT | Status: DC
  Administered 2017-10-05 – 2017-10-06 (×6): 0.63 mg via RESPIRATORY_TRACT
  Filled 2017-10-05 (×7): qty 3

## 2017-10-05 MED ORDER — PIPERACILLIN-TAZOBACTAM 3.375 G IVPB
3.3750 g | Freq: Three times a day (TID) | INTRAVENOUS | Status: DC
  Administered 2017-10-05 – 2017-10-12 (×22): 3.375 g via INTRAVENOUS
  Filled 2017-10-05 (×22): qty 50

## 2017-10-05 MED ORDER — LEVALBUTEROL HCL 0.63 MG/3ML IN NEBU
0.6300 mg | INHALATION_SOLUTION | Freq: Four times a day (QID) | RESPIRATORY_TRACT | Status: DC | PRN
  Administered 2017-10-05: 0.63 mg via RESPIRATORY_TRACT
  Filled 2017-10-05: qty 3

## 2017-10-05 NOTE — Plan of Care (Signed)
  Problem: Nutrition: Goal: Adequate nutrition will be maintained 10/05/2017 1638 by Dorene Sorrow, RN Outcome: Progressing 10/05/2017 1638 by Dorene Sorrow, RN Outcome: Progressing 10/05/2017 1332 by Dorene Sorrow, RN Outcome: Progressing   Problem: Elimination: Goal: Will not experience complications related to bowel motility Outcome: Progressing   Problem: Pain Managment: Goal: General experience of comfort will improve 10/05/2017 1638 by Dorene Sorrow, RN Outcome: Progressing 10/05/2017 1638 by Dorene Sorrow, RN Outcome: Progressing 10/05/2017 1332 by Dorene Sorrow, RN Outcome: Progressing

## 2017-10-05 NOTE — Evaluation (Signed)
Clinical/Bedside Swallow Evaluation Patient Details  Name: Luke Jacobson. MRN: [id number] Date of Birth: 1940-12-19  Today's Date: 10/05/2017 Time: SLP Start Time (ACUTE ONLY): 1356 SLP Stop Time (ACUTE ONLY): 1445 SLP Time Calculation (min) (ACUTE ONLY): 49 min  Past Medical History:  Past Medical History:  Diagnosis Date  . AAA (abdominal aortic aneurysm) ([place])   . Arrhythmia has had episodes of fast heart rate  . Arthritis   . Ascending aortic aneurysm (HCC)    4.6cm   . Asthma   . Blood transfusion without reported diagnosis   . Cardiomegaly   . COPD (chronic obstructive pulmonary disease) ([place])   . Coronary atherosclerosis   . Depression   . HIT (heparin-induced thrombocytopenia) ([place])   . Hx of heart transplant ([place])   . Hypercholesteremia   . Hypertension   . Pleural effusion   . Shortness of breath    Past Surgical History:  Past Surgical History:  Procedure Laterality Date  . heart stents  Sept 2016   2 stents  . HEART TRANSPLANT  1995   HPI:  77 yo male with h/o heart transplant 1997, stent 2016, AAA, COPD, HTN, PE, pharyngoesophageal dysphagia admitted with one week hx of coughing with sputum production.  Pt found to have RML and RLL infiltrate.   Swallow evaluation ordered.  Pt on Bipap previously and now on 6 L HFNC.  He reports being on 5 L of oxygen at home for the last year.  Recent esophagram showed prominent cricopharyngeus that covered 60% of lumen, no aspiration.  Pt saw GI who advised not to conduct EGD due to pt's respiratory issues and advised to dysphagia mitigation strategies.  Pt admits to dysphagia x1 year.  Last pna was 2017.  Pt admits to being on oxygen for approximately 3 years.  Swallow evaluation ordered.     Assessment / Plan / Recommendation Clinical Impression  Pt has known chronic pharyngoesophageal dysphagia with last recommendation from GI for dysphagia mitigation strategies.  Pt with negative CN exam and review of esophagram 08/2017  showed adequate pharyngeal clearance of barium with prominent cricopharyngeus.  Pt observed with intake of 4 ounces juice, 4 ounces pudding and 4 graham crackers.  Swallow was timely without indications of pharyngeal residuals.  Delayed cough noted during po intake x3 of approximately 15 swallows - which pt attributes to his pulmonary issue stating "it's secretions".    Educated pt and daughter to findings of last esophagram and items aspirated that are likely more toxic to the airway - eg water being pH neutral making it safest drink if aspirated.  Advised pt follow aspiration/dysphagia mitigation strategies that GI MD had recommended including chewing food well, moistening bread, cutting meat in small pieces.  Pt admits to continue to cough with liquids but admits chin tuck mitigates this issue.  Recommend dys3/thin diet with strict precautions.  Will follow up briefly to assure tolerance and for possible RMST to maximize airway clearance/protection- daughter and pt educated and agreeable to plan.    SLP Visit Diagnosis: Dysphagia, pharyngoesophageal phase (R13.14)    Aspiration Risk  Moderate aspiration risk    Diet Recommendation Dysphagia 3 (Mech soft);Thin liquid   Liquid Administration via: Cup;Straw Medication Administration: Whole meds with puree(start and follow with liquids) Supervision: Patient able to self feed Compensations: Small sips/bites;Slow rate;Follow solids with liquid Postural Changes: Remain upright for at least 30 minutes after po intake    Other  Recommendations Oral Care Recommendations: Oral care BID  Follow up Recommendations        Frequency and Duration min 2x/week  1 week       Prognosis Prognosis for Safe Diet Advancement: [place] Date of Onset: 10/05/17 HPI: 77 yo male with h/o heart transplant 1997, stent 2016, AAA, COPD, HTN, PE, pharyngoesophageal dysphagia admitted with one week hx of coughing with sputum production.  Pt  found to have RML and RLL infiltrate.   Swallow evaluation ordered.  Pt on Bipap previously and now on 6 L HFNC.  He reports being on 5 L of oxygen at home for the last year.  Recent esophagram showed prominent cricopharyngeus that covered 60% of lumen, no aspiration.  Pt saw GI who advised not to conduct EGD due to pt's respiratory issues and advised to dysphagia mitigation strategies.  Pt admits to dysphagia x1 year.  Last pna was 2017.  Pt admits to being on oxygen for approximately 3 years.  Swallow evaluation ordered.   Type of Study: Bedside Swallow Evaluation Previous Swallow Assessment: see HHX Diet Prior to this Study: NPO Temperature Spikes Noted: No Respiratory Status: Nasal cannula(HFNC) History of Recent Intubation: No Behavior/Cognition: Alert;Cooperative Oral Cavity Assessment: Within Functional Limits Oral Care Completed by SLP: No Oral Cavity - Dentition: Dentures, top;Adequate natural dentition Vision: Functional for self-feeding Self-Feeding Abilities: Able to feed self Patient Positioning: Upright in bed Baseline Vocal Quality: Hoarse Volitional Cough: Strong Volitional Swallow: Able to elicit    Oral/Motor/Sensory Function Overall Oral Motor/Sensory Function: Within functional limits(no focal CN deficits)   Ice Chips Ice chips: Not tested   Thin Liquid Thin Liquid: Impaired Presentation: Straw Pharyngeal  Phase Impairments: Cough - Delayed    Nectar Thick Nectar Thick Liquid: Not tested   Honey Thick Honey Thick Liquid: Not tested   Puree Puree: Impaired Pharyngeal Phase Impairments: Cough - Delayed   Solid   GO   Solid: Impaired Presentation: Self Fed Pharyngeal Phase Impairments: Cough - Delayed        Macario Golds 10/05/2017,3:39 PM   Luanna Salk, [facility] SLP [phone]
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
## 2017-10-05 NOTE — Plan of Care (Signed)
  Problem: Safety: Goal: Ability to remain free from injury will improve Outcome: Progressing   Problem: Skin Integrity: Goal: Risk for impaired skin integrity will decrease Outcome: Progressing   Problem: Pain Managment: Goal: General experience of comfort will improve Outcome: Progressing

## 2017-10-06 ENCOUNTER — Telehealth: Payer: Self-pay | Admitting: Pulmonary Disease

## 2017-10-06 DIAGNOSIS — N183 Chronic kidney disease, stage 3 (moderate): Secondary | ICD-10-CM

## 2017-10-06 DIAGNOSIS — J181 Lobar pneumonia, unspecified organism: Principal | ICD-10-CM

## 2017-10-06 DIAGNOSIS — I5032 Chronic diastolic (congestive) heart failure: Secondary | ICD-10-CM

## 2017-10-06 DIAGNOSIS — I272 Pulmonary hypertension, unspecified: Secondary | ICD-10-CM

## 2017-10-06 LAB — LACTIC ACID, PLASMA: LACTIC ACID, VENOUS: 1.3 mmol/L (ref 0.5–1.9)

## 2017-10-06 LAB — CBC WITH DIFFERENTIAL/PLATELET
BASOS ABS: 0 10*3/uL (ref 0.0–0.1)
BASOS PCT: 0 %
EOS ABS: 0 10*3/uL (ref 0.0–0.7)
EOS PCT: 0 %
HCT: 31.7 % — ABNORMAL LOW (ref 39.0–52.0)
HEMOGLOBIN: 10.1 g/dL — AB (ref 13.0–17.0)
LYMPHS ABS: 0.8 10*3/uL (ref 0.7–4.0)
Lymphocytes Relative: 7 %
MCH: 30.4 pg (ref 26.0–34.0)
MCHC: 31.9 g/dL (ref 30.0–36.0)
MCV: 95.5 fL (ref 78.0–100.0)
Monocytes Absolute: 0.3 10*3/uL (ref 0.1–1.0)
Monocytes Relative: 3 %
NEUTROS PCT: 90 %
Neutro Abs: 9.5 10*3/uL — ABNORMAL HIGH (ref 1.7–7.7)
PLATELETS: 123 10*3/uL — AB (ref 150–400)
RBC: 3.32 MIL/uL — AB (ref 4.22–5.81)
RDW: 14.2 % (ref 11.5–15.5)
WBC: 10.6 10*3/uL — ABNORMAL HIGH (ref 4.0–10.5)

## 2017-10-06 LAB — BASIC METABOLIC PANEL
Anion gap: 8 (ref 5–15)
BUN: 53 mg/dL — ABNORMAL HIGH (ref 6–20)
CHLORIDE: 103 mmol/L (ref 101–111)
CO2: 27 mmol/L (ref 22–32)
Calcium: 8.8 mg/dL — ABNORMAL LOW (ref 8.9–10.3)
Creatinine, Ser: 1.33 mg/dL — ABNORMAL HIGH (ref 0.61–1.24)
GFR, EST AFRICAN AMERICAN: 59 mL/min — AB (ref >60)
GFR, EST NON AFRICAN AMERICAN: 51 mL/min — AB (ref >60)
Glucose, Bld: 151 mg/dL — ABNORMAL HIGH (ref 65–99)
POTASSIUM: 4.6 mmol/L (ref 3.5–5.1)
SODIUM: 138 mmol/L (ref 135–145)

## 2017-10-06 LAB — BRAIN NATRIURETIC PEPTIDE: B NATRIURETIC PEPTIDE 5: 293.2 pg/mL — AB (ref 0.0–100.0)

## 2017-10-06 LAB — STREP PNEUMONIAE URINARY ANTIGEN: Strep Pneumo Urinary Antigen: NEGATIVE

## 2017-10-06 LAB — HIV ANTIBODY (ROUTINE TESTING W REFLEX): HIV Screen 4th Generation wRfx: NONREACTIVE

## 2017-10-06 MED ORDER — LEVALBUTEROL HCL 0.63 MG/3ML IN NEBU
0.6300 mg | INHALATION_SOLUTION | Freq: Four times a day (QID) | RESPIRATORY_TRACT | Status: DC
  Administered 2017-10-07 – 2017-10-12 (×21): 0.63 mg via RESPIRATORY_TRACT
  Filled 2017-10-06 (×20): qty 3

## 2017-10-06 NOTE — Telephone Encounter (Signed)
Ok to switch back - same dose

## 2017-10-06 NOTE — Plan of Care (Signed)
  Problem: Education: Goal: Knowledge of General Education information will improve Outcome: Progressing   Problem: Health Behavior/Discharge Planning: Goal: Ability to manage health-related needs will improve Outcome: Progressing   Problem: Clinical Measurements: Goal: Ability to maintain clinical measurements within normal limits will improve Outcome: Progressing Goal: Will remain free from infection Outcome: Progressing Goal: Diagnostic test results will improve Outcome: Progressing Goal: Respiratory complications will improve Outcome: Progressing Goal: Cardiovascular complication will be avoided Outcome: Progressing   Problem: Activity: Goal: Risk for activity intolerance will decrease Outcome: Progressing   Problem: Nutrition: Goal: Adequate nutrition will be maintained Outcome: Not Applicable   Problem: Coping: Goal: Level of anxiety will decrease Outcome: Progressing   Problem: Nutrition: Goal: Adequate nutrition will be maintained Outcome: Not Applicable   Problem: Coping: Goal: Level of anxiety will decrease Outcome: Progressing   Problem: Elimination: Goal: Will not experience complications related to bowel motility Outcome: Not Applicable Goal: Will not experience complications related to urinary retention Outcome: Not Applicable   Problem: Pain Managment: Goal: General experience of comfort will improve Outcome: Progressing   Problem: Safety: Goal: Ability to remain free from injury will improve Outcome: Progressing

## 2017-10-06 NOTE — Telephone Encounter (Signed)
Forms and letter were finally scanned into patient's chart. Will fax them to the new number provided.   Will route to myself to follow up on.
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
## 2017-10-06 NOTE — Telephone Encounter (Signed)
Patient was previously prescribed Adcirca but wanted to change to a more affordable medication. He was then switched to Revatio.   Patient wants to switch back to Adcirca.   RA, are you ok with this switch? Please advise. Thanks!

## 2017-10-07 ENCOUNTER — Inpatient Hospital Stay (HOSPITAL_COMMUNITY): Payer: Medicare Other

## 2017-10-07 LAB — CBC
HCT: 36.8 % — ABNORMAL LOW (ref 39.0–52.0)
HEMOGLOBIN: 11.2 g/dL — AB (ref 13.0–17.0)
MCH: 29.9 pg (ref 26.0–34.0)
MCHC: 30.4 g/dL (ref 30.0–36.0)
MCV: 98.1 fL (ref 78.0–100.0)
Platelets: 124 10*3/uL — ABNORMAL LOW (ref 150–400)
RBC: 3.75 MIL/uL — AB (ref 4.22–5.81)
RDW: 14.1 % (ref 11.5–15.5)
WBC: 7.5 10*3/uL (ref 4.0–10.5)

## 2017-10-07 LAB — BASIC METABOLIC PANEL
Anion gap: 7 (ref 5–15)
BUN: 38 mg/dL — ABNORMAL HIGH (ref 6–20)
CHLORIDE: 104 mmol/L (ref 101–111)
CO2: 29 mmol/L (ref 22–32)
CREATININE: 1.13 mg/dL (ref 0.61–1.24)
Calcium: 9 mg/dL (ref 8.9–10.3)
GFR calc non Af Amer: >60 mL/min (ref >60)
Glucose, Bld: 106 mg/dL — ABNORMAL HIGH (ref 65–99)
Potassium: 4.4 mmol/L (ref 3.5–5.1)
Sodium: 140 mmol/L (ref 135–145)

## 2017-10-07 LAB — EXPECTORATED SPUTUM ASSESSMENT W GRAM STAIN, RFLX TO RESP C

## 2017-10-07 LAB — EXPECTORATED SPUTUM ASSESSMENT W REFEX TO RESP CULTURE

## 2017-10-07 MED ORDER — ENSURE ENLIVE PO LIQD
237.0000 mL | Freq: Two times a day (BID) | ORAL | Status: DC
  Administered 2017-10-07 – 2017-10-12 (×7): 237 mL via ORAL

## 2017-10-07 MED ORDER — FUROSEMIDE 10 MG/ML IJ SOLN
40.0000 mg | Freq: Every day | INTRAMUSCULAR | Status: DC
  Administered 2017-10-07: 40 mg via INTRAVENOUS
  Filled 2017-10-07: qty 4

## 2017-10-07 MED ORDER — METHYLPREDNISOLONE SODIUM SUCC 40 MG IJ SOLR
40.0000 mg | Freq: Four times a day (QID) | INTRAMUSCULAR | Status: AC
  Administered 2017-10-07 – 2017-10-08 (×4): 40 mg via INTRAVENOUS
  Filled 2017-10-07 (×5): qty 1

## 2017-10-07 MED ORDER — TADALAFIL (PAH) 20 MG PO TABS: 40.0000 mg | ORAL_TABLET | Freq: Every day | ORAL | 11 refills | Status: AC

## 2017-10-07 NOTE — Telephone Encounter (Signed)
Have sent a new Rx of Adcirca 31m which was the previous dose of that med pt was on.  Discontinued Revatio off of pt's med list.  Attempted to call MJerrye Beaversletting her know we were switching pt back to that med but unable to speak with her.  Left MJerrye Beaversa detailed message letting her know this was being done and also stated in my message for her to call [facility] that we know the message was received and they know of the med change.
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
## 2017-10-07 NOTE — Telephone Encounter (Signed)
Lori from [place] is calling you they received the your vm and if the pt needs anything just have them call Jerrye Beavers [phone]

## 2017-10-07 NOTE — Telephone Encounter (Signed)
Called and spoke with patient, he is aware. Nothing further needed at this time.
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
## 2017-10-07 NOTE — Care Management Note (Signed)
Case Management Note  Patient Details  Name: Edger Husain. MRN: [id number] Date of Birth: [birth year]  Subjective/Objective: 77 y/o m admitted w/acute resp failure. [facility] home. Active w/[facility] home 02.                   Action/Plan:d/c plan home.   Expected Discharge Date:  (unknown)               Expected Discharge Plan:  Home/Self Care  In-House Referral:     Discharge planning Services  CM Consult  Post Acute Care Choice:  Durable Medical Equipment(Active w/[facility] home 02 6l) Choice offered to:     DME Arranged:    DME Agency:     HH Arranged:    HH Agency:     Status of Service:  In process, will continue to follow  If discussed at Long Length of Stay Meetings, dates discussed:    Additional Comments:  Dessa Phi, RN 10/07/2017, 11:48 AM

## 2017-10-08 ENCOUNTER — Inpatient Hospital Stay (HOSPITAL_COMMUNITY): Payer: Medicare Other

## 2017-10-08 DIAGNOSIS — I5031 Acute diastolic (congestive) heart failure: Secondary | ICD-10-CM

## 2017-10-08 LAB — BASIC METABOLIC PANEL
ANION GAP: 10 (ref 5–15)
BUN: 42 mg/dL — ABNORMAL HIGH (ref 6–20)
CO2: 28 mmol/L (ref 22–32)
Calcium: 8.9 mg/dL (ref 8.9–10.3)
Chloride: 101 mmol/L (ref 101–111)
Creatinine, Ser: 1.22 mg/dL (ref 0.61–1.24)
GFR, EST NON AFRICAN AMERICAN: 56 mL/min — AB (ref >60)
Glucose, Bld: 234 mg/dL — ABNORMAL HIGH (ref 65–99)
Potassium: 4 mmol/L (ref 3.5–5.1)
Sodium: 139 mmol/L (ref 135–145)

## 2017-10-08 LAB — ECHOCARDIOGRAM COMPLETE
HEIGHTINCHES: 67 in
Weight: 1686.08 oz

## 2017-10-08 LAB — MAGNESIUM: Magnesium: 1.8 mg/dL (ref 1.7–2.4)

## 2017-10-08 MED ORDER — FUROSEMIDE 10 MG/ML IJ SOLN
40.0000 mg | Freq: Two times a day (BID) | INTRAMUSCULAR | Status: DC
  Administered 2017-10-08 – 2017-10-10 (×5): 40 mg via INTRAVENOUS
  Filled 2017-10-08 (×5): qty 4

## 2017-10-08 NOTE — Progress Notes (Signed)
Pt currently on 7 LPM [place] and tolerating well at this time.  BIPAP not indicated at this time, RT to monitor and assess as needed.

## 2017-10-08 NOTE — Progress Notes (Signed)
Pt states he does not want to wear the SCD's because it makes his restless legs worse.  Also has a wallet with $20.00 and his cell phone in the bed with him. Informed hospital was not responsible for patient"s belongings.

## 2017-10-08 NOTE — Progress Notes (Signed)
Pt's HR in the 150's, appears to be ST.  Pt asymptomatic, no c/o chest pain or SOB.  BP 130/87 resp 22,  02 sats 98% on 7L Hi flo.   EKG done which showed ST.  Dr Wyline Copas called and informed of event, will continue to watch closely and if  HR becomes elevated again will draw a troponin.

## 2017-10-08 NOTE — Progress Notes (Signed)
  Echocardiogram 2D Echocardiogram has been performed.  Luke Jacobson 10/08/2017, 9:09 AM
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
## 2017-10-08 NOTE — Progress Notes (Signed)
Report received from [place], [place]. Pt assessed. Reviewed and agree with documented assessment.

## 2017-10-09 LAB — BASIC METABOLIC PANEL
ANION GAP: 9 (ref 5–15)
BUN: 50 mg/dL — AB (ref 6–20)
CALCIUM: 9.4 mg/dL (ref 8.9–10.3)
CO2: 35 mmol/L — AB (ref 22–32)
Chloride: 97 mmol/L — ABNORMAL LOW (ref 101–111)
Creatinine, Ser: 1.22 mg/dL (ref 0.61–1.24)
GFR calc Af Amer: >60 mL/min (ref >60)
GFR, EST NON AFRICAN AMERICAN: 56 mL/min — AB (ref >60)
GLUCOSE: 140 mg/dL — AB (ref 65–99)
Potassium: 4.2 mmol/L (ref 3.5–5.1)
Sodium: 141 mmol/L (ref 135–145)

## 2017-10-09 LAB — CULTURE, BLOOD (ROUTINE X 2)
CULTURE: NO GROWTH
Culture: NO GROWTH
Special Requests: ADEQUATE

## 2017-10-09 MED ORDER — PREDNISONE 20 MG PO TABS
40.0000 mg | ORAL_TABLET | Freq: Every day | ORAL | Status: DC
  Administered 2017-10-10 – 2017-10-12 (×3): 40 mg via ORAL
  Filled 2017-10-09 (×3): qty 2
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
## 2017-10-10 ENCOUNTER — Inpatient Hospital Stay (HOSPITAL_COMMUNITY): Payer: Medicare Other

## 2017-10-10 DIAGNOSIS — E43 Unspecified severe protein-calorie malnutrition: Secondary | ICD-10-CM

## 2017-10-10 LAB — CULTURE, RESPIRATORY

## 2017-10-10 LAB — BASIC METABOLIC PANEL
Anion gap: 12 (ref 5–15)
BUN: 52 mg/dL — AB (ref 6–20)
CALCIUM: 9.2 mg/dL (ref 8.9–10.3)
CHLORIDE: 92 mmol/L — AB (ref 101–111)
CO2: 34 mmol/L — ABNORMAL HIGH (ref 22–32)
CREATININE: 1.23 mg/dL (ref 0.61–1.24)
GFR calc non Af Amer: 56 mL/min — ABNORMAL LOW (ref >60)
Glucose, Bld: 102 mg/dL — ABNORMAL HIGH (ref 65–99)
Potassium: 3.5 mmol/L (ref 3.5–5.1)
SODIUM: 138 mmol/L (ref 135–145)

## 2017-10-10 LAB — CULTURE, RESPIRATORY W GRAM STAIN

## 2017-10-10 MED ORDER — FUROSEMIDE 10 MG/ML IJ SOLN
60.0000 mg | Freq: Two times a day (BID) | INTRAMUSCULAR | Status: DC
  Administered 2017-10-10 – 2017-10-11 (×2): 60 mg via INTRAVENOUS
  Filled 2017-10-10 (×2): qty 6
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
## 2017-10-11 ENCOUNTER — Inpatient Hospital Stay (HOSPITAL_COMMUNITY): Payer: Medicare Other

## 2017-10-11 ENCOUNTER — Encounter (HOSPITAL_COMMUNITY): Payer: Self-pay

## 2017-10-11 DIAGNOSIS — R609 Edema, unspecified: Secondary | ICD-10-CM

## 2017-10-11 DIAGNOSIS — I5021 Acute systolic (congestive) heart failure: Secondary | ICD-10-CM

## 2017-10-11 DIAGNOSIS — J9601 Acute respiratory failure with hypoxia: Secondary | ICD-10-CM

## 2017-10-11 LAB — BASIC METABOLIC PANEL
Anion gap: 12 (ref 5–15)
BUN: 60 mg/dL — ABNORMAL HIGH (ref 6–20)
CALCIUM: 9.1 mg/dL (ref 8.9–10.3)
CO2: 36 mmol/L — ABNORMAL HIGH (ref 22–32)
CREATININE: 1.46 mg/dL — AB (ref 0.61–1.24)
Chloride: 90 mmol/L — ABNORMAL LOW (ref 101–111)
GFR calc non Af Amer: 45 mL/min — ABNORMAL LOW (ref >60)
GFR, EST AFRICAN AMERICAN: 52 mL/min — AB (ref >60)
Glucose, Bld: 185 mg/dL — ABNORMAL HIGH (ref 65–99)
Potassium: 4.4 mmol/L (ref 3.5–5.1)
SODIUM: 138 mmol/L (ref 135–145)

## 2017-10-11 MED ORDER — FUROSEMIDE 10 MG/ML IJ SOLN
40.0000 mg | Freq: Two times a day (BID) | INTRAMUSCULAR | Status: DC
  Administered 2017-10-11 – 2017-10-12 (×2): 40 mg via INTRAVENOUS
  Filled 2017-10-11 (×2): qty 4

## 2017-10-11 MED ORDER — POLYETHYLENE GLYCOL 3350 17 G PO PACK: 17.0000 g | PACK | Freq: Every day | ORAL | Status: DC | PRN

## 2017-10-11 MED ORDER — DOCUSATE SODIUM 100 MG PO CAPS
100.0000 mg | ORAL_CAPSULE | Freq: Every day | ORAL | Status: DC
  Administered 2017-10-11 – 2017-10-12 (×2): 100 mg via ORAL
  Filled 2017-10-11 (×2): qty 1

## 2017-10-11 NOTE — Progress Notes (Signed)
PT Cancellation Note  Patient Details Name: Luke Jacobson. MRN: [id number] DOB: 1941/02/11   Cancelled Treatment:    Reason Eval/Treat Not Completed: Attempted PT eval. Pt stated MD wanted him to wait until he gets chest imaging completed. Will hold PT for now and check back another time.    Weston Anna, MPT Pager: [phone]
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
## 2017-10-11 NOTE — Progress Notes (Signed)
LE venous duplex prelim: negative for DVT. Alexander Aument Eunice, RDMS, RVT  

## 2017-10-11 NOTE — Care Management Important Message (Signed)
Important Message  Patient Details  Name: Luke Jacobson. MRN: [id number] Date of Birth: November 01, 1940   Medicare Important Message Given:  Yes    Kerin Salen 10/11/2017, 11:14 AMImportant Message  Patient Details  Name: Luke Jacobson. MRN: [id number] Date of Birth: December 22, 1940   Medicare Important Message Given:  Yes    Kerin Salen 10/11/2017, 11:13 AM

## 2017-10-12 LAB — BASIC METABOLIC PANEL
ANION GAP: 13 (ref 5–15)
BUN: 66 mg/dL — AB (ref 6–20)
CALCIUM: 8.8 mg/dL — AB (ref 8.9–10.3)
CO2: 34 mmol/L — ABNORMAL HIGH (ref 22–32)
CREATININE: 1.42 mg/dL — AB (ref 0.61–1.24)
Chloride: 89 mmol/L — ABNORMAL LOW (ref 101–111)
GFR calc Af Amer: 54 mL/min — ABNORMAL LOW (ref >60)
GFR, EST NON AFRICAN AMERICAN: 47 mL/min — AB (ref >60)
Glucose, Bld: 116 mg/dL — ABNORMAL HIGH (ref 65–99)
Potassium: 3.6 mmol/L (ref 3.5–5.1)
Sodium: 136 mmol/L (ref 135–145)

## 2017-10-12 LAB — CBC
HCT: 37.4 % — ABNORMAL LOW (ref 39.0–52.0)
HEMOGLOBIN: 11.8 g/dL — AB (ref 13.0–17.0)
MCH: 29.8 pg (ref 26.0–34.0)
MCHC: 31.6 g/dL (ref 30.0–36.0)
MCV: 94.4 fL (ref 78.0–100.0)
PLATELETS: 124 10*3/uL — AB (ref 150–400)
RBC: 3.96 MIL/uL — ABNORMAL LOW (ref 4.22–5.81)
RDW: 13.1 % (ref 11.5–15.5)
WBC: 9.5 10*3/uL (ref 4.0–10.5)

## 2017-10-12 MED ORDER — PREDNISONE 10 MG PO TABS: 10.0000 mg | ORAL_TABLET | Freq: Every day | ORAL | Status: AC

## 2017-10-12 MED ORDER — AMOXICILLIN-POT CLAVULANATE 500-125 MG PO TABS: 1.0000 | ORAL_TABLET | Freq: Two times a day (BID) | ORAL | 0 refills | Status: AC

## 2017-10-12 MED ORDER — AMOXICILLIN-POT CLAVULANATE 500-125 MG PO TABS
1.0000 | ORAL_TABLET | Freq: Two times a day (BID) | ORAL | Status: DC
  Filled 2017-10-12: qty 1

## 2017-10-12 MED ORDER — FUROSEMIDE 40 MG PO TABS: 40.0000 mg | ORAL_TABLET | Freq: Every day | ORAL | Status: DC
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
## 2017-10-12 NOTE — Care Management Note (Signed)
Case Management Note  Patient Details  Name: Luke Jacobson. MRN: [id number] Date of Birth: 02/16/1941  Subjective/Objective:                    Action/Plan:   Expected Discharge Date:  (unknown)               Expected Discharge Plan:  Home/Self Care  In-House Referral:     Discharge planning Services  CM Consult  Post Acute Care Choice:  Durable Medical Equipment(Active w/[facility] home 02 6l) Choice offered to:  Patient  DME Arranged:  Oxygen DME Agency:  [place]:  RN, PT [facility] Agency:  [place]  Status of Service:  In process, will continue to follow  If discussed at Long Length of Stay Meetings, dates discussed:    Additional CommentsPurcell Mouton, RN 10/12/2017, 3:17 PM
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
## 2017-10-12 NOTE — Plan of Care (Signed)
  Problem: Health Behavior/Discharge Planning: Goal: Ability to manage health-related needs will improve Outcome: Progressing   Problem: Pain Managment: Goal: General experience of comfort will improve Outcome: Progressing   Problem: Safety: Goal: Ability to remain free from injury will improve Outcome: Progressing

## 2017-10-12 NOTE — Progress Notes (Signed)
Patient given discharge, follow up, and medication instructions, verbalized understanding, IV and telemetry monitor removed, prescriptions and personal belongings with patient, family to transport home

## 2017-10-12 NOTE — Progress Notes (Signed)
Patient given oxymizer for home use. Patient currently on 5 L; recommended that he remain on that until follow-up appt with PCP. Tolerating well at this time.

## 2017-10-13 ENCOUNTER — Telehealth: Payer: Self-pay | Admitting: *Deleted

## 2017-10-13 NOTE — Telephone Encounter (Signed)
Per chart review: Admit date: 10/04/2017 Discharge date: 10/12/2017  Admitted From: Home Disposition:  Home  Recommendations for Outpatient Follow-up:  1. Follow up with PCP in 1-2 weeks 2. Follow up with Pulmonary as scheduled 3. Follow up at [facility] as scheduled  Home Health:PT, RN, SLP  Equipment/Devices:Oxymizer    Discharge Condition:Improved CODE STATUS:Full Diet recommendation: Dysphagia 3 with thin liquids _________________________________________________________ Transition Care Management Follow-up Telephone Call   Date discharged? 10/12/17   How have you been since you were released from the hospital? "ok, it's going to take a minute to get back on my feet"   Do you understand why you were in the hospital? yes   Do you understand the discharge instructions? yes   Where were you discharged to? Home   Items Reviewed:  Medications reviewed: yes. Patient has not picked up his discharge medications yet. I reviewed the importance of starting the antibiotic and steroids as soon as possible. He states he will get them today.  Allergies reviewed: yes  Dietary changes reviewed: yes  Referrals reviewed: yes   Functional Questionnaire:   Activities of Daily Living (ADLs):   He states they are independent in the following: ambulation, bathing and hygiene, feeding, continence, grooming, toileting and dressing States they require assistance with the following: none   Any transportation issues/concerns?: no   Any patient concerns? no   Confirmed importance and date/time of follow-up visits scheduled yes  Provider Appointment booked with Dr Juleen China 10/20/17 11:20  Confirmed with patient if condition begins to worsen call PCP or go to the ER.  Patient was given the office number and encouraged to call back with question or concerns.  : yes

## 2017-10-19 ENCOUNTER — Inpatient Hospital Stay: Payer: Self-pay | Admitting: Adult Health

## 2017-10-19 ENCOUNTER — Telehealth: Payer: Self-pay

## 2017-10-19 ENCOUNTER — Telehealth: Payer: Self-pay | Admitting: Family Medicine

## 2017-10-19 NOTE — Telephone Encounter (Signed)
Copied from [place] [id number]. Topic: General - Other >> Oct 19, 2017 11:51 AM Carolyn Stare wrote:  Luke Jacobson with [facility] call to  say pt would like to hold off on PT he feels he is to weak to start   [phone]

## 2017-10-19 NOTE — Telephone Encounter (Signed)
FYI 

## 2017-10-19 NOTE — Telephone Encounter (Signed)
Copied from [place] [id number]. Topic: Quick Communication - See Telephone Encounter >> Oct 19, 2017  3:01 PM Hewitt Shorts wrote: CRM for notification. See Telephone encounter for: 10/19/17.[facility] is calling to confirm that Luke Jacobson is pt PCP and that she will sign off on orders when meed and also pt is needing something called in for thrush [facility]   Best number for [facility] [phone]

## 2017-10-20 ENCOUNTER — Inpatient Hospital Stay: Payer: Medicare Other | Admitting: Family Medicine

## 2017-10-20 MED ORDER — NYSTATIN 100000 UNIT/ML MT SUSP: OROMUCOSAL | 0 refills | Status: DC

## 2017-10-20 MED ORDER — NYSTATIN 100000 UNIT/ML MT SUSP: OROMUCOSAL | 0 refills | Status: AC

## 2017-10-20 NOTE — Telephone Encounter (Signed)
See note.

## 2017-10-20 NOTE — Telephone Encounter (Signed)
Patient says the nystatin is on backorder at [facility] and would like [id number] [place] [address] [phone] The thrush is so bad he can only drink liquids. He would like a call back to know when it is called in.

## 2017-10-20 NOTE — Telephone Encounter (Signed)
Okay for orders and for nystatin swish and spit to be sent to pharmacy.

## 2017-10-20 NOTE — Telephone Encounter (Signed)
Left message for patient that prescription has been sent to the new pharmacy.

## 2017-10-20 NOTE — Telephone Encounter (Signed)
Left message for pt on voicemail to call office on mobile and home numbers. Also called [place] and spoke to [place] told her yes Dr Juleen China will be PCP and sign orders and Rx for Nystatin for thrush was sent to [facility] on [address] for pt. Malachy Mood verbalized understanding. [place] Livings I left message for pt to call office so I can tell him about Rx. Malachy Mood verbalized understanding.

## 2017-10-20 NOTE — Telephone Encounter (Signed)
See message, pt needing something for Thrush sen to pharmacy.

## 2017-10-21 ENCOUNTER — Inpatient Hospital Stay: Payer: Self-pay | Admitting: Adult Health

## 2017-10-22 ENCOUNTER — Telehealth: Payer: Self-pay | Admitting: Family Medicine

## 2017-10-22 ENCOUNTER — Telehealth: Payer: Self-pay | Admitting: Pulmonary Disease

## 2017-10-22 ENCOUNTER — Other Ambulatory Visit: Payer: Self-pay

## 2017-10-22 DIAGNOSIS — J9611 Chronic respiratory failure with hypoxia: Secondary | ICD-10-CM

## 2017-10-22 NOTE — Telephone Encounter (Signed)
Hospice needs forms faxed to [phone]. States that they need forms ASAP due to patient not doing well,.

## 2017-10-22 NOTE — Telephone Encounter (Signed)
Okay order.

## 2017-10-22 NOTE — Telephone Encounter (Signed)
OK 

## 2017-10-22 NOTE — Telephone Encounter (Signed)
Order placed will need to call daughter and let her know.

## 2017-10-22 NOTE — Telephone Encounter (Signed)
Copied from [place] [id number]. Topic: Quick Communication - See Telephone Encounter >> Oct 22, 2017  8:51 AM Boyd Kerbs wrote: CRM for notification.   Pt. Daughter, Pamala Hurry called, [facility] discharged him and he has pneumonia and is at end of life.  They are requesting Hospice.  She spoke to [employer] from hospice [phone]. He is needing this today!  See Telephone encounter for: 10/22/17.

## 2017-10-22 NOTE — Telephone Encounter (Signed)
Called and spoke with pt letting him know we received a call from [place], [place] from [facility].  Pt stated to me she had just left the home after hooking up the oximyzer to his O2.  Pt stated to me his main problem is his O2 dropping so quickly when he gets up to walk around and states it is worse than before, especially after he had pna and was placed in the hospital.  Pt had to cancel HFU appt with TP that was supposed to have been 4/11 due to not having any energy to get out and about.  Pt does have a HFU with PCP Dr. Juleen China Monday, 4/15 so I encouraged pt to try to keep that appt and to call us if anything was needed.  Pt stated he wanted to make Dr. Elsworth Soho aware of what has been happening recently.  I stated to pt that RA was not back at the office until Monday, 4/15 but have seen that RA is reachable.  Will route this to RA for him to review.

## 2017-10-22 NOTE — Telephone Encounter (Signed)
Send to new pharmacy as per first message.

## 2017-10-22 NOTE — Telephone Encounter (Signed)
I have reviewed his hospital course, x-ray and CT scan. Does he feel like he needs to be hospitalized again? Unfortunately if his oxygen level does not keep up to 88%, he may need to go to the emergency room

## 2017-10-22 NOTE — Telephone Encounter (Signed)
Patient returned call, CB is [phone].

## 2017-10-22 NOTE — Telephone Encounter (Signed)
Please advise

## 2017-10-22 NOTE — Telephone Encounter (Signed)
Called and spoke with Luke Jacobson letting her know the information stated by RA and also that I had talked with pt who stated if he needed to go to the ER he would.  Luke Jacobson expressed understanding and was glad I had returned the call to her.  Nothing further needed at this time.

## 2017-10-22 NOTE — Telephone Encounter (Signed)
Spoke with Luke Jacobson, she states the pt's oxygen has been in the low 80's. He is on 9L now and she thinks he needs an oximyzer. The pt states he has one but he is not using it and Luke Jacobson encourages him to use it. I called pt to get more details. He does have an appointment on Monday with Dr. Juleen China.   ATC pt, no answer. Left message for pt to call back.

## 2017-10-22 NOTE — Telephone Encounter (Signed)
Malachy Mood from [facility] is returning call. CB is [phone].

## 2017-10-22 NOTE — Telephone Encounter (Signed)
Attempted to call Luke Jacobson but no answer. Left message for Luke Jacobson to return our call so we can address information stated by RA to see if she agrees with the info.  Will also call pt to see what he thinks but will await a call from [place] too.   Called and spoke with pt to see if he might need to be hospitalized again, and pt stated to me he does not think he needs to be at this point. Pt states he has a little more freedom at his house and if he needs to go back to the hospital he would go back.  He also stated he wants to try to stay out of the hospital if at all possible due to not wanting to come in contact with all the illnesses at the hospital.  Pt states he is trying to eat some, which is better than yesterday and wants to wait over the weekend to see how things go. He stated to me if he felt like he needed to go to the ER, he would do so.

## 2017-10-22 NOTE — Telephone Encounter (Signed)
Fax received from pharmacy. Would like medication changed nystatin oral is on back order

## 2017-10-22 NOTE — Telephone Encounter (Signed)
Called daughter and let her know she will call if any questions.

## 2017-10-22 NOTE — Telephone Encounter (Signed)
Faxed to number provided.

## 2017-10-25 ENCOUNTER — Inpatient Hospital Stay: Payer: Medicare Other | Admitting: Family Medicine

## 2017-10-25 ENCOUNTER — Telehealth: Payer: Self-pay | Admitting: Family Medicine

## 2017-10-25 NOTE — Telephone Encounter (Unsigned)
Copied from [place] [phone]. Topic: Quick Communication - See Telephone Encounter >> Oct 25, 2017  2:32 PM Neva Seat wrote: Thomes Dinning, RN [phone]  Activating Hospice Care order for pt of [place] - [facility]  for 1 tab 2 times [facility] constipation [id number] [place]: 9 liter oxygen saturation of 85 percent RN cleaned the filters and refilled the water - oxygen tube was caught in the door. Upon leaving: 8 liters saturation of 92 percent.

## 2017-10-25 NOTE — Telephone Encounter (Signed)
FYI

## 2017-10-28 ENCOUNTER — Telehealth: Payer: Self-pay | Admitting: Family Medicine

## 2017-10-28 NOTE — Telephone Encounter (Signed)
See note.

## 2017-10-28 NOTE — Telephone Encounter (Signed)
Copied from [place] [id number] [id number]. Topic: Quick Communication - See Telephone Encounter >> Oct 28, 2017 11:07 AM Vernona Rieger wrote: CRM for notification. See Telephone encounter for: 10/28/17.  Thomes Dinning, RN from hospice/[facility] called to let Dr Juleen China know that every evening aroud 6 or 7pm the patient gets very anxious and afraid.  He told him that he would like him to [place] his inhaler when this happens. He wants to know would Dr Juleen China be willing to give him some kind of anxiety medication.   His Vitals:: 11am BP 122/74, HR 88, Pulse 22, Oxygen 92 on 8 liters   Call back is [phone]

## 2017-10-29 NOTE — Telephone Encounter (Signed)
Pt is aware the office is close today

## 2017-11-01 ENCOUNTER — Other Ambulatory Visit: Payer: Self-pay

## 2017-11-01 MED ORDER — PREDNISONE 10 MG PO TABS: 10.0000 mg | ORAL_TABLET | Freq: Four times a day (QID) | ORAL | 0 refills | Status: AC | PRN

## 2017-11-01 MED ORDER — LORAZEPAM 0.5 MG PO TABS: 0.5000 mg | ORAL_TABLET | Freq: Two times a day (BID) | ORAL | 1 refills | Status: AC | PRN

## 2017-11-01 NOTE — Telephone Encounter (Signed)
Okay recommended Lorazepam 0.5 mg every 4 hours as needed.  Okay to add prednisone 10 mg p.o. daily.

## 2017-11-01 NOTE — Telephone Encounter (Signed)
Lm to call office

## 2017-11-01 NOTE — Telephone Encounter (Signed)
Is there no medication on the hospice protocol?

## 2017-11-01 NOTE — Telephone Encounter (Signed)
Spoke with [name] from Hospice. He stated that the patient has been having a lot of anxiety since his extended stay at hospital. This usually happen between 6 and 11 every night, He does take his temazepam about 11 PM and sleep through the night. He is wanting something for anxiety. [name] stated that Hospice recommends Lorazepam 0.5 mg Q4H.  Patient is also wanting to know if he can go back on the prednisone because it makes him feel better. He has had a productive cough with tan sputum. [name] says the his lungs sound clear. [name] also wanted to let you know that Luke Jacobson slide out of the bed Saturday night. He is having some pain in the tail bone.

## 2017-11-01 NOTE — Telephone Encounter (Signed)
Called [name] back gave all information [facility] [address] [facility]

## 2017-11-09 ENCOUNTER — Ambulatory Visit: Payer: Self-pay | Admitting: Adult Health

## 2017-11-10 ENCOUNTER — Telehealth: Payer: Self-pay | Admitting: Family Medicine

## 2017-11-10 DIAGNOSIS — I714 Abdominal aortic aneurysm, without rupture: Secondary | ICD-10-CM

## 2017-11-10 DIAGNOSIS — J9621 Acute and chronic respiratory failure with hypoxia: Secondary | ICD-10-CM

## 2017-11-10 DIAGNOSIS — J189 Pneumonia, unspecified organism: Secondary | ICD-10-CM

## 2017-11-10 DIAGNOSIS — E43 Unspecified severe protein-calorie malnutrition: Secondary | ICD-10-CM

## 2017-11-10 DIAGNOSIS — M1990 Unspecified osteoarthritis, unspecified site: Secondary | ICD-10-CM

## 2017-11-10 DIAGNOSIS — N183 Chronic kidney disease, stage 3 (moderate): Secondary | ICD-10-CM

## 2017-11-10 DIAGNOSIS — I272 Pulmonary hypertension, unspecified: Secondary | ICD-10-CM

## 2017-11-10 DIAGNOSIS — F329 Major depressive disorder, single episode, unspecified: Secondary | ICD-10-CM

## 2017-11-10 DIAGNOSIS — I5032 Chronic diastolic (congestive) heart failure: Secondary | ICD-10-CM

## 2017-11-10 DIAGNOSIS — J440 Chronic obstructive pulmonary disease with acute lower respiratory infection: Secondary | ICD-10-CM

## 2017-11-10 DIAGNOSIS — I25811 Atherosclerosis of native coronary artery of transplanted heart without angina pectoris: Secondary | ICD-10-CM

## 2017-11-10 DIAGNOSIS — I130 Hypertensive heart and chronic kidney disease with heart failure and stage 1 through stage 4 chronic kidney disease, or unspecified chronic kidney disease: Secondary | ICD-10-CM

## 2017-11-10 NOTE — Telephone Encounter (Signed)
Luke Jacobson called number let know ready for pick up and faxed copy.

## 2017-11-10 NOTE — Telephone Encounter (Signed)
Placed on your computer to fill out.

## 2017-11-10 NOTE — Telephone Encounter (Signed)
Completed.

## 2017-11-10 NOTE — Telephone Encounter (Signed)
Paperwork: Death Dietitian received by [employer] requesting form]:  [employer] geologist   Individual made aware of 3-5 business day turn around (Y/N): Y   Office form(s) completed and placed with paperwork (Y/N): N/A   Form location: Wallace's folder in pod

## 2017-11-10 DEATH — deceased

## 2017-11-11 ENCOUNTER — Inpatient Hospital Stay: Payer: Self-pay | Admitting: Adult Health

## 2017-11-28 ENCOUNTER — Other Ambulatory Visit: Payer: Self-pay | Admitting: Family Medicine

## 2017-11-29 NOTE — Telephone Encounter (Signed)
This patient is deceased. Please update chart.

## 2017-12-02 NOTE — Telephone Encounter (Signed)
Updated chart/ please refuse medication request

## 2017-12-02 NOTE — Telephone Encounter (Signed)
Updated patients chart

## 2019-07-25 IMAGING — RF DG ESOPHAGUS
12 of 13 series · 16 of 24 positions shown · non-contrast
Comparison: None.

CLINICAL DATA: Dysphagia. Dysphagia; can't burp; liquids go down
the wrong way x 6-8 months; hiccups x 1 year; ulcer seen anterior
lateral middle esophagus during Endoscopy 1 year ago; 28.7 mGy;
mins

EXAM:
ESOPHOGRAM / BARIUM SWALLOW / BARIUM TABLET STUDY
TECHNIQUE: Combined double contrast and single contrast examination performed
using effervescent crystals, thick barium liquid, and thin barium
liquid. The patient was observed with fluoroscopy swallowing a 13 mm
barium sulphate tablet.
FLUOROSCOPY TIME:  Fluoroscopy Time:
Radiation Exposure Index (if provided by the fluoroscopic device):
20.7 mGy
Number of Acquired Spot Images: 15

[Series 24: fluoro_barium 2fps_bw · 0.19mm/px · 2 of 13 frames shown (1 of 10)]
[frame 2/13]
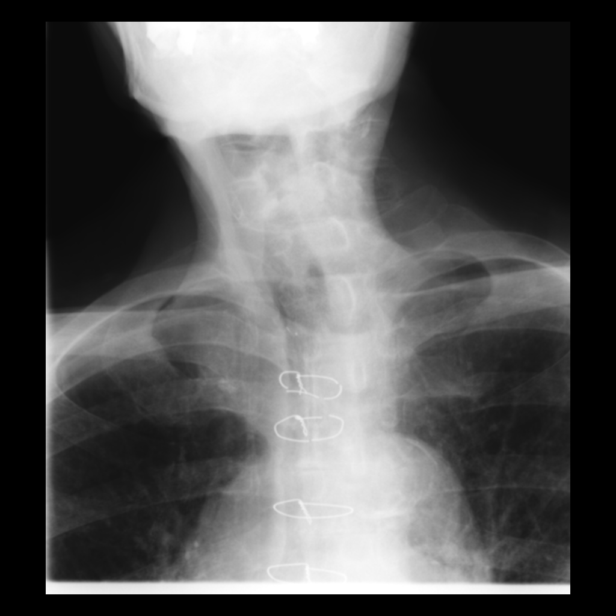
[frame 12/13]
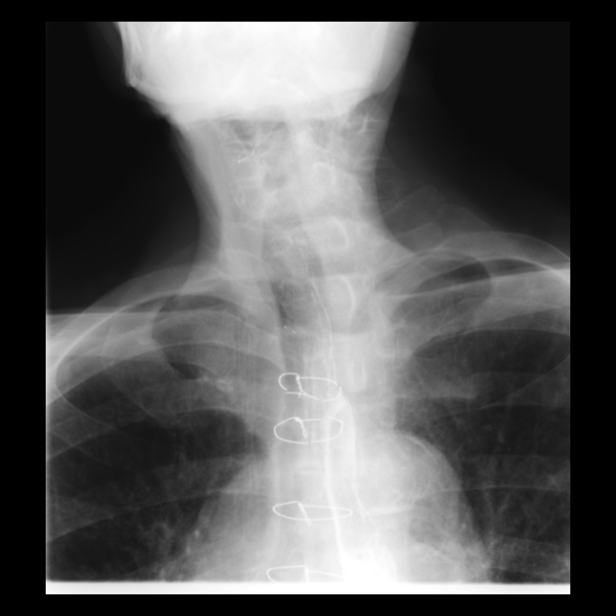

[Series 25: fluoro_barium 2fps_bw · 0.18mm/px · 2 of 11 frames shown (2 of 10)]
[frame 2/11]
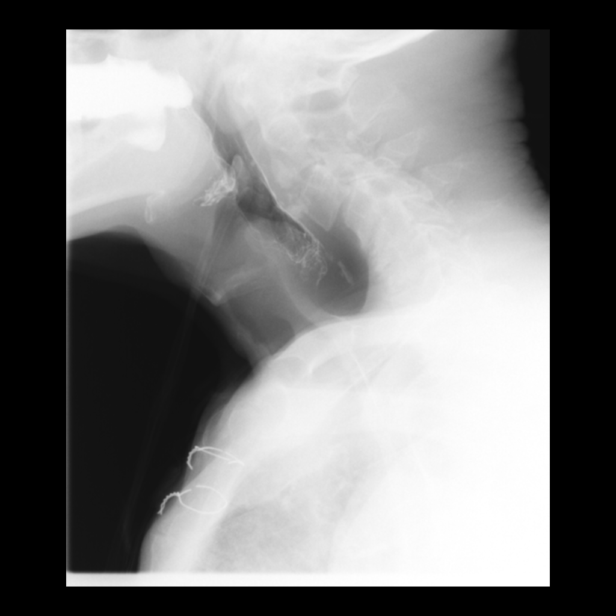
[frame 10/11]
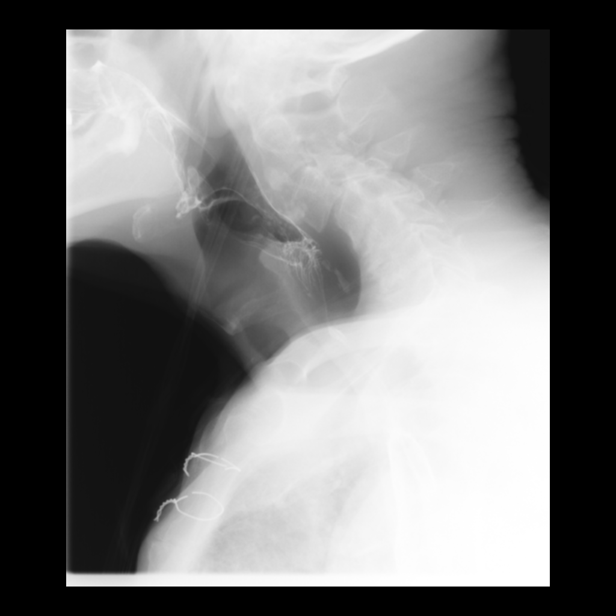

[Series 26: cp_standard · 0.36mm/px · 2 of 112 frames shown (1 of 2)]
[frame 17/112]
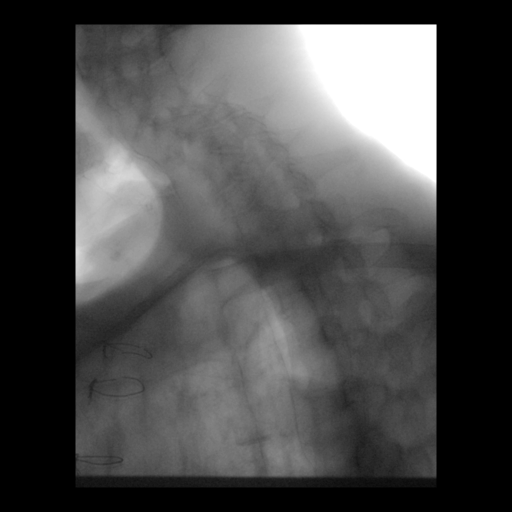
[frame 75/112]
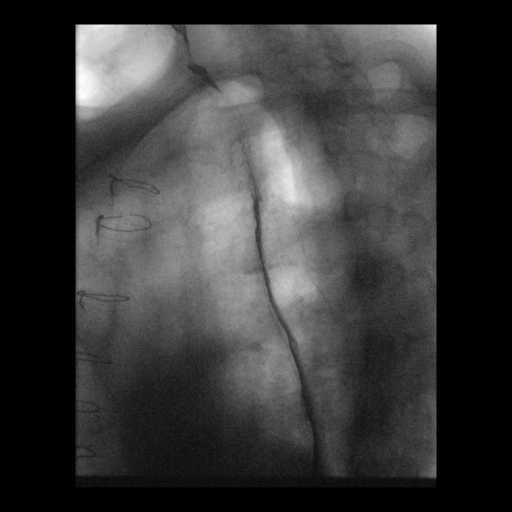

[Series 27: fluoro_barium 2fps_bw · 0.18mm/px · 1 of 2 frames shown (3 of 10)]
[frame 1/2]
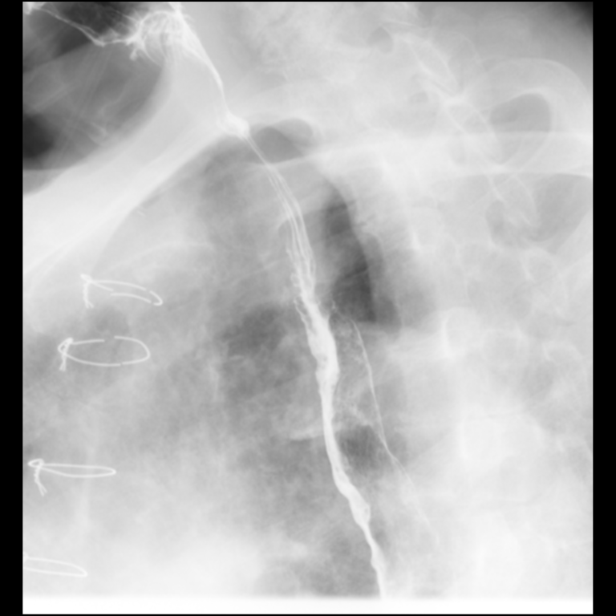

[Series 28: fluoro_barium 2fps_bw · 0.18mm/px · 1 of 1 slices shown (4 of 10)]
[im 1/1]
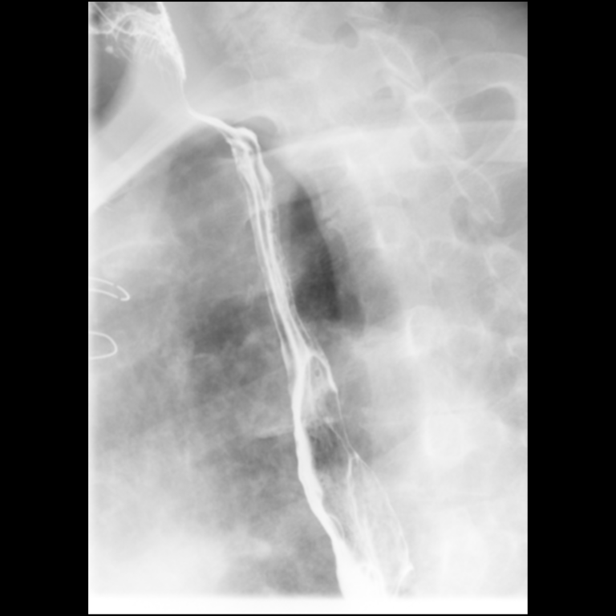

[Series 29: fluoro_barium 2fps_bw · 0.18mm/px · 1 of 2 frames shown (5 of 10)]
[frame 2/2]
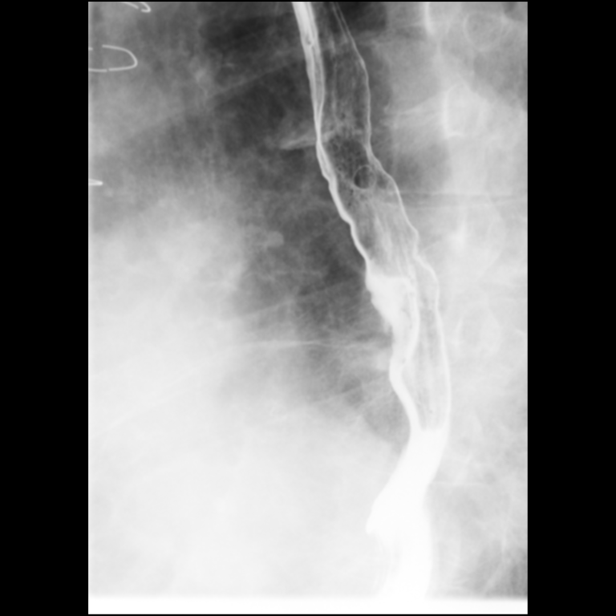

[Series 30: fluoro_barium 2fps_bw · 0.18mm/px · 1 of 2 frames shown (6 of 10)]
[frame 2/2]
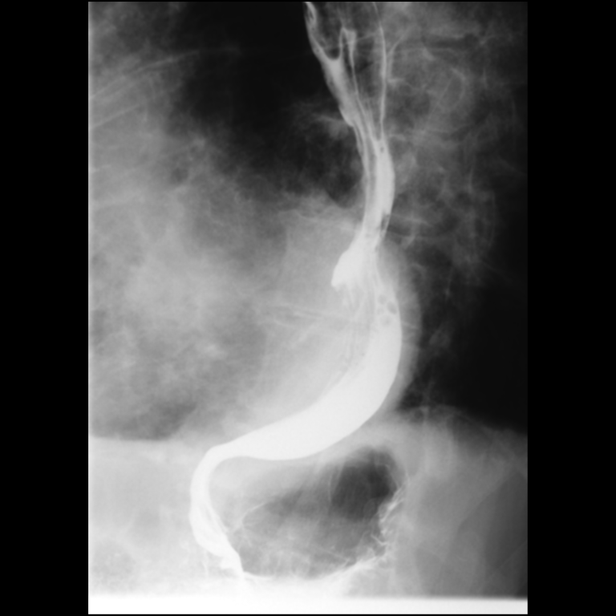

[Series 31: fluoro_barium 2fps_bw · 0.18mm/px · 1 of 2 frames shown (7 of 10)]
[frame 2/2]
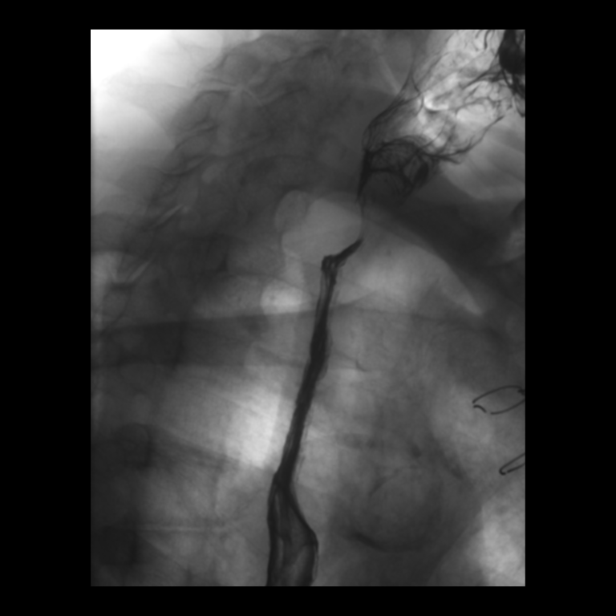

[Series 32: cp_standard · 0.37mm/px · 2 of 134 frames shown (2 of 2)]
[frame 23/134]
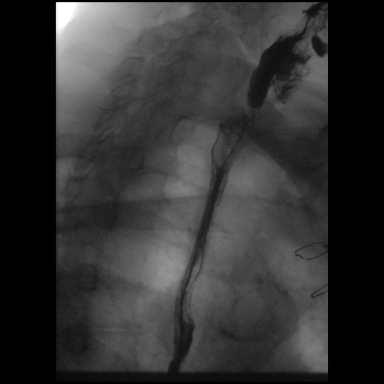
[frame 68/134]
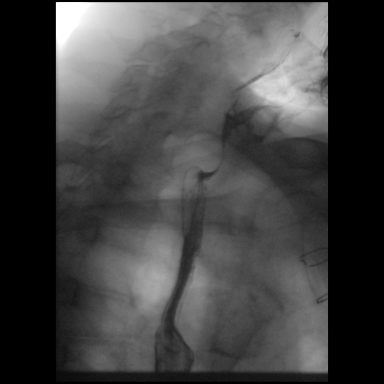

[Series 33: fluoro_barium 2fps_bw · 0.18mm/px · 1 of 2 frames shown (8 of 10)]
[frame 2/2]
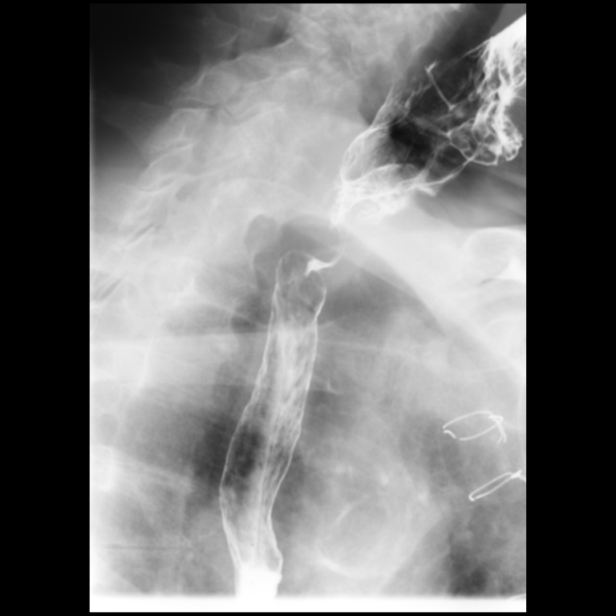

[Series 34: fluoro_barium 2fps_bw · 0.18mm/px · 1 of 1 slices shown (9 of 10)]
[im 1/1]
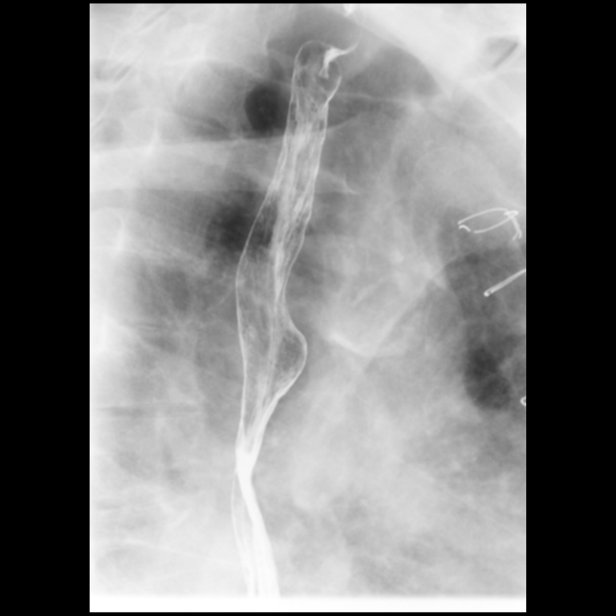

[Series 36: fluoro_barium 2fps_bw · 0.19mm/px · 1 of 1 slices shown (10 of 10)]
[im 1/1]
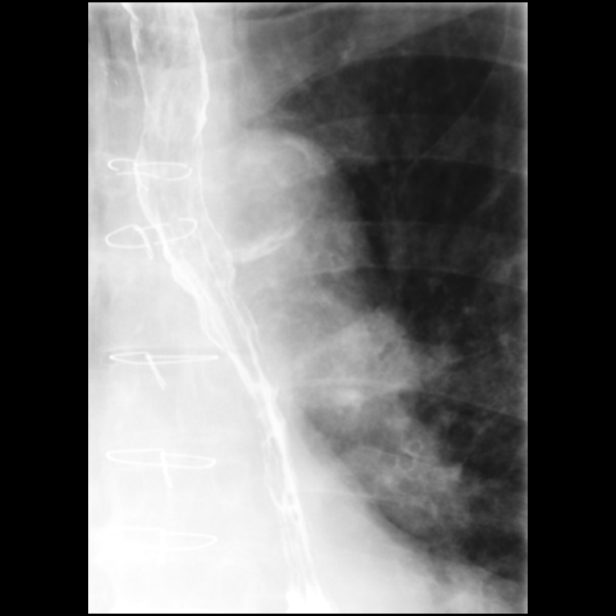

[16 of 24 positions shown; findings below may reference images not displayed]

FINDINGS: Rapid sequence evaluation of the high cervical esophagus
demonstrates a prominent cricopharyngeal bar which extends 60%
across the lumen of the cervical esophagus. No web.

No mucosal irregularity in the thoracic esophagus or distal
esophagus. No ulceration identified.

There is a region persistent stricturing in the high thoracic
esophagus at the level of the thoracic inlet which persists on
multiple projections

Normal esophageal motility. No reflux demonstrated in the course
exam.

13 mm barium tab passed GE junction easily.
IMPRESSION: 1. Prominent cricopharyngeal bar in the high cervical esophagus.
2. Potential segment of esophageal stricturing in the proximal
thoracic esophagus at the level of thoracic inlet. This did not
obstructive barium tablet.
3. No mucosal irregularity or ulceration.
4. Barium tablet passed GE junction easily.
